# Patient Record
Sex: Female | Born: 1960 | Race: Black or African American | Hispanic: No | Marital: Married | Smoking: Never smoker
Health system: Southern US, Community
[De-identification: ages and names within clinical notes are randomized; demographics above are authoritative.]

## PROBLEM LIST (undated history)

## (undated) DIAGNOSIS — K219 Gastro-esophageal reflux disease without esophagitis: Secondary | ICD-10-CM

## (undated) DIAGNOSIS — G620 Drug-induced polyneuropathy: Secondary | ICD-10-CM

## (undated) DIAGNOSIS — K8020 Calculus of gallbladder without cholecystitis without obstruction: Secondary | ICD-10-CM

## (undated) DIAGNOSIS — C801 Malignant (primary) neoplasm, unspecified: Secondary | ICD-10-CM

## (undated) DIAGNOSIS — T451X5A Adverse effect of antineoplastic and immunosuppressive drugs, initial encounter: Secondary | ICD-10-CM

---

## 1993-08-12 HISTORY — PX: TUBAL LIGATION: SHX77

## 1997-11-07 ENCOUNTER — Other Ambulatory Visit: Admission: RE | Admit: 1997-11-07 | Discharge: 1997-11-07 | Payer: Self-pay | Admitting: Obstetrics & Gynecology

## 1999-01-31 ENCOUNTER — Other Ambulatory Visit: Admission: RE | Admit: 1999-01-31 | Discharge: 1999-01-31 | Payer: Self-pay | Admitting: Obstetrics & Gynecology

## 1999-11-06 ENCOUNTER — Encounter: Admission: RE | Admit: 1999-11-06 | Discharge: 1999-11-06 | Payer: Self-pay | Admitting: Family Medicine

## 2000-02-01 ENCOUNTER — Other Ambulatory Visit: Admission: RE | Admit: 2000-02-01 | Discharge: 2000-02-01 | Payer: Self-pay | Admitting: Obstetrics & Gynecology

## 2001-01-29 ENCOUNTER — Encounter: Admission: RE | Admit: 2001-01-29 | Discharge: 2001-01-29 | Payer: Self-pay | Admitting: Family Medicine

## 2001-01-29 ENCOUNTER — Encounter: Payer: Self-pay | Admitting: Family Medicine

## 2001-01-30 ENCOUNTER — Encounter: Admission: RE | Admit: 2001-01-30 | Discharge: 2001-01-30 | Payer: Self-pay | Admitting: Orthopedic Surgery

## 2001-10-06 ENCOUNTER — Encounter: Payer: Self-pay | Admitting: Family Medicine

## 2001-10-06 ENCOUNTER — Encounter: Admission: RE | Admit: 2001-10-06 | Discharge: 2001-10-06 | Payer: Self-pay | Admitting: Family Medicine

## 2002-09-20 ENCOUNTER — Other Ambulatory Visit: Admission: RE | Admit: 2002-09-20 | Discharge: 2002-09-20 | Payer: Self-pay | Admitting: Obstetrics & Gynecology

## 2003-05-09 ENCOUNTER — Encounter: Admission: RE | Admit: 2003-05-09 | Discharge: 2003-05-09 | Payer: Self-pay | Admitting: Obstetrics & Gynecology

## 2003-05-09 ENCOUNTER — Encounter: Payer: Self-pay | Admitting: Obstetrics & Gynecology

## 2004-01-05 ENCOUNTER — Other Ambulatory Visit: Admission: RE | Admit: 2004-01-05 | Discharge: 2004-01-05 | Payer: Self-pay | Admitting: Obstetrics & Gynecology

## 2004-05-14 ENCOUNTER — Encounter: Admission: RE | Admit: 2004-05-14 | Discharge: 2004-05-14 | Payer: Self-pay | Admitting: Obstetrics & Gynecology

## 2005-03-15 ENCOUNTER — Other Ambulatory Visit: Admission: RE | Admit: 2005-03-15 | Discharge: 2005-03-15 | Payer: Self-pay | Admitting: Obstetrics & Gynecology

## 2005-05-15 ENCOUNTER — Encounter: Admission: RE | Admit: 2005-05-15 | Discharge: 2005-05-15 | Payer: Self-pay | Admitting: Obstetrics & Gynecology

## 2006-05-16 ENCOUNTER — Encounter: Admission: RE | Admit: 2006-05-16 | Discharge: 2006-05-16 | Payer: Self-pay | Admitting: Obstetrics & Gynecology

## 2007-06-17 ENCOUNTER — Encounter: Admission: RE | Admit: 2007-06-17 | Discharge: 2007-06-17 | Payer: Self-pay | Admitting: Obstetrics & Gynecology

## 2008-06-20 ENCOUNTER — Encounter: Admission: RE | Admit: 2008-06-20 | Discharge: 2008-06-20 | Payer: Self-pay | Admitting: Obstetrics & Gynecology

## 2009-07-19 ENCOUNTER — Encounter: Admission: RE | Admit: 2009-07-19 | Discharge: 2009-07-19 | Payer: Self-pay | Admitting: Obstetrics & Gynecology

## 2010-07-23 ENCOUNTER — Encounter
Admission: RE | Admit: 2010-07-23 | Discharge: 2010-07-23 | Payer: Self-pay | Source: Home / Self Care | Attending: Obstetrics & Gynecology | Admitting: Obstetrics & Gynecology

## 2010-09-01 ENCOUNTER — Encounter: Payer: Self-pay | Admitting: Obstetrics & Gynecology

## 2010-09-02 ENCOUNTER — Encounter: Payer: Self-pay | Admitting: Obstetrics & Gynecology

## 2011-06-27 ENCOUNTER — Other Ambulatory Visit: Payer: Self-pay | Admitting: Obstetrics & Gynecology

## 2011-06-27 DIAGNOSIS — Z1231 Encounter for screening mammogram for malignant neoplasm of breast: Secondary | ICD-10-CM

## 2011-07-25 ENCOUNTER — Ambulatory Visit: Payer: Self-pay

## 2011-07-26 ENCOUNTER — Ambulatory Visit
Admission: RE | Admit: 2011-07-26 | Discharge: 2011-07-26 | Disposition: A | Discharge status: Home / Self Care | Payer: Self-pay | Source: Ambulatory Visit | Attending: Obstetrics & Gynecology | Admitting: Obstetrics & Gynecology

## 2011-07-26 DIAGNOSIS — Z1231 Encounter for screening mammogram for malignant neoplasm of breast: Secondary | ICD-10-CM

## 2012-02-08 ENCOUNTER — Emergency Department (HOSPITAL_COMMUNITY)
Admission: EM | Admit: 2012-02-08 | Discharge: 2012-02-09 | Disposition: A | Discharge status: Home / Self Care | Payer: PRIVATE HEALTH INSURANCE | Attending: Emergency Medicine | Admitting: Emergency Medicine

## 2012-02-08 ENCOUNTER — Encounter (HOSPITAL_COMMUNITY): Payer: Self-pay | Admitting: Emergency Medicine

## 2012-02-08 DIAGNOSIS — J020 Streptococcal pharyngitis: Secondary | ICD-10-CM | POA: Insufficient documentation

## 2012-02-08 LAB — RAPID STREP SCREEN (MED CTR MEBANE ONLY): Streptococcus, Group A Screen (Direct): POSITIVE — AB

## 2012-02-08 MED ORDER — IBUPROFEN 400 MG PO TABS
800.0000 mg | ORAL_TABLET | Freq: Once | ORAL | Status: AC
  Administered 2012-02-08: 800 mg via ORAL
  Filled 2012-02-08: qty 2

## 2012-02-08 MED ORDER — ACETAMINOPHEN 325 MG PO TABS
975.0000 mg | ORAL_TABLET | Freq: Once | ORAL | Status: AC
  Administered 2012-02-08: 975 mg via ORAL

## 2012-02-08 MED ORDER — ACETAMINOPHEN 325 MG PO TABS
ORAL_TABLET | ORAL | Status: AC
  Administered 2012-02-08: 975 mg via ORAL
  Filled 2012-02-08: qty 3

## 2012-02-08 MED ORDER — SODIUM CHLORIDE 0.9 % IV BOLUS (SEPSIS)
1000.0000 mL | Freq: Once | INTRAVENOUS | Status: AC
  Administered 2012-02-09: 1000 mL via INTRAVENOUS

## 2012-02-08 MED ORDER — DEXAMETHASONE SODIUM PHOSPHATE 10 MG/ML IJ SOLN
10.0000 mg | Freq: Once | INTRAMUSCULAR | Status: AC
  Administered 2012-02-09: 10 mg via INTRAVENOUS
  Filled 2012-02-08: qty 1

## 2012-02-08 MED ORDER — PENICILLIN G BENZATHINE 1200000 UNIT/2ML IM SUSP
1.2000 10*6.[IU] | Freq: Once | INTRAMUSCULAR | Status: AC
  Administered 2012-02-09: 1.2 10*6.[IU] via INTRAMUSCULAR
  Filled 2012-02-08: qty 2

## 2012-02-08 NOTE — ED Provider Notes (Signed)
History     CSN: [id number]  Arrival date & time 02/08/12  2058   First MD Initiated Contact with Patient 02/08/12 2109      Chief Complaint  Patient presents with  . Headache  . Fever  . Sore Throat    (Consider location/radiation/quality/duration/timing/severity/associated sxs/prior treatment) The history is provided by the patient.   healthy 51 year old female presents to the emergency department with a chief complaint of sore throat since yesterday. Constant pain, severe. Associated with fever, headache, bilateral ear pain, myalgias. Denies rhinorrhea, neck stiffness, cough, chest pain, N/V, rash. Has been using Tylenol and ibuprofen with some relief. No prior treatment.   History reviewed. No pertinent past medical history.  History reviewed. No pertinent past surgical history.  History reviewed. No pertinent family history.  History  Substance Use Topics  . Smoking status: Never Smoker   . Smokeless tobacco: Not on file  . Alcohol Use: No     Review of Systems  All pertinent positives and negatives are reviewed in the history of present illness. Allergies  Review of patient's allergies indicates no known allergies.  Home Medications  No current outpatient prescriptions on file.  BP 144/85  Pulse 125  Temp 102.8 F (39.3 C) (Oral)  Resp 18  SpO2 95%  Physical Exam  Nursing note reviewed. Constitutional: She appears well-developed and well-nourished.       Vital signs are reviewed and are significant for fever and tachycardia. Uncomfortable appearing.  HENT:  Head: Normocephalic and atraumatic. No trismus in the jaw.  Right Ear: Tympanic membrane, external ear and ear canal normal.  Left Ear: Tympanic membrane, external ear and ear canal normal.  Nose: Nose normal. Right sinus exhibits no maxillary sinus tenderness and no frontal sinus tenderness. Left sinus exhibits no maxillary sinus tenderness and no frontal sinus tenderness.  Mouth/Throat: Uvula is  midline and mucous membranes are normal. No uvula swelling. Oropharyngeal exudate, posterior oropharyngeal edema and posterior oropharyngeal erythema present. No tonsillar abscesses.  Neck: Normal range of motion. Neck supple.  Cardiovascular:       Tachycardia with regular rhythm.  Pulmonary/Chest: Effort normal. No respiratory distress.  Lymphadenopathy:    She has cervical adenopathy.  Neurological: She is alert.  Skin: Skin is warm and dry. No rash noted.  Psychiatric: She has a normal mood and affect.    ED Course  Procedures (including critical care time)  Labs Reviewed - No data to display No results found.   Dx 1: Strep pharyngitis   MDM  Strep pharyngitis. No trismus with bilaterally symmetric tonsillar exudates- not c/w peritonsillar abscess. Maintaining airway and tolerating PO well in ED. Return precautions discussed. Pt prefers single abx injection rather than PO abx course. Tachycardia and fever improved after ibuprofen, IV fluids.        Shaaron Adler, PA-C 02/08/12 2350

## 2012-02-08 NOTE — Discharge Instructions (Signed)

## 2012-02-08 NOTE — ED Notes (Signed)
Pt reports has sore throat headache and fever with generalized malaise onset x2 days

## 2012-02-09 NOTE — ED Provider Notes (Signed)
Medical screening examination/treatment/procedure(s) were performed by non-physician practitioner and as supervising physician I was immediately available for consultation/collaboration.   Forbes Cellar, MD 02/09/12 0021

## 2012-03-27 ENCOUNTER — Other Ambulatory Visit: Payer: Self-pay | Admitting: Obstetrics & Gynecology

## 2012-03-27 DIAGNOSIS — Z1231 Encounter for screening mammogram for malignant neoplasm of breast: Secondary | ICD-10-CM

## 2012-05-04 ENCOUNTER — Ambulatory Visit (INDEPENDENT_AMBULATORY_CARE_PROVIDER_SITE_OTHER): Payer: PRIVATE HEALTH INSURANCE | Admitting: Sports Medicine

## 2012-05-04 VITALS — BP 120/74 | Ht 62.0 in | Wt 180.0 lb

## 2012-05-04 DIAGNOSIS — M25569 Pain in unspecified knee: Secondary | ICD-10-CM | POA: Insufficient documentation

## 2012-05-04 DIAGNOSIS — M7120 Synovial cyst of popliteal space [Baker], unspecified knee: Secondary | ICD-10-CM

## 2012-05-04 NOTE — Progress Notes (Signed)
  Subjective:    Patient ID: Natasha Huffman, female    DOB: January 20, 1961, 51 y.o.   MRN: [id number]  HPI chief complaint: Left knee pain  Very pleasant 51 year old female comes in today complaining of 1 month of left knee pain. Pain began acutely and she is going from a seated to standing position. She felt a pulling sensation in the posterior aspect of her knee followed shortly thereafter by pain. Pain is mainly along the medial and posterior aspect of the knee. She has noticed some swelling. She was initially placed on Voltaren but was most recently placed on indomethacin which has been very helpful. She's been on indomethacin for the past 5 days and her pain has improved. No mechanical symptoms. No associated numbness or tingling. No problems with his knee in the past. No prior knee surgeries.  Medical history is positive for reflux disease Medications include Nexium, melatonin, and Estroven She's allergic to amoxicillin Socially she does not smoke, does not drink and is married.    Review of Systems as above.     Objective:   Physical Exam Well-developed, well-nourished. No acute distress. Awake alert and oriented x3. Normal affect. Vital signs are reviewed.  Left knee: Range of motion 0-120. Trace effusion. She is swelling in the popliteal fossa consistent with a Baker's cyst. She is tender to palpation along the medial joint line. Negative McMurray. Negative Thessalys.. Knee is stable to valgus and varus stressing. Minimal patellofemoral crepitus. She is neurovascular intact distally. Walking without significant limp.       Assessment & Plan:  1. 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 She will continue on this for an additional 7 days. She is cautioned about stomach upset and will discontinue the medicine immediately if this occurs. I've given her a body helix  compression sleeve for her left knee. Followup with me in 3 weeks. If symptoms persist or worsen in the interim, I would consider plain x-rays of the left knee prior to contemplating a cortisone injection. If symptoms resolve patient may cancel her followup appointment to followup when necessary.

## 2012-05-25 ENCOUNTER — Ambulatory Visit: Payer: PRIVATE HEALTH INSURANCE | Admitting: Sports Medicine

## 2012-07-27 ENCOUNTER — Ambulatory Visit: Payer: PRIVATE HEALTH INSURANCE

## 2012-08-27 ENCOUNTER — Ambulatory Visit
Admission: RE | Admit: 2012-08-27 | Discharge: 2012-08-27 | Disposition: A | Discharge status: Home / Self Care | Payer: PRIVATE HEALTH INSURANCE | Source: Ambulatory Visit | Attending: Obstetrics & Gynecology | Admitting: Obstetrics & Gynecology

## 2012-08-27 DIAGNOSIS — Z1231 Encounter for screening mammogram for malignant neoplasm of breast: Secondary | ICD-10-CM

## 2012-10-13 ENCOUNTER — Ambulatory Visit
Admission: RE | Admit: 2012-10-13 | Discharge: 2012-10-13 | Disposition: A | Discharge status: Home / Self Care | Payer: PRIVATE HEALTH INSURANCE | Source: Ambulatory Visit | Attending: Physician Assistant | Admitting: Physician Assistant

## 2012-10-13 ENCOUNTER — Other Ambulatory Visit: Payer: Self-pay | Admitting: Physician Assistant

## 2012-10-13 DIAGNOSIS — R52 Pain, unspecified: Secondary | ICD-10-CM

## 2012-10-21 ENCOUNTER — Ambulatory Visit
Admission: RE | Admit: 2012-10-21 | Discharge: 2012-10-21 | Disposition: A | Discharge status: Home / Self Care | Payer: PRIVATE HEALTH INSURANCE | Source: Ambulatory Visit | Attending: Internal Medicine | Admitting: Internal Medicine

## 2012-10-21 ENCOUNTER — Other Ambulatory Visit: Payer: Self-pay | Admitting: Internal Medicine

## 2013-08-24 ENCOUNTER — Other Ambulatory Visit: Payer: Self-pay

## 2013-08-24 DIAGNOSIS — Z1231 Encounter for screening mammogram for malignant neoplasm of breast: Secondary | ICD-10-CM

## 2013-09-06 ENCOUNTER — Encounter (HOSPITAL_COMMUNITY): Payer: Self-pay | Admitting: Emergency Medicine

## 2013-09-06 ENCOUNTER — Emergency Department (HOSPITAL_COMMUNITY): Payer: PRIVATE HEALTH INSURANCE

## 2013-09-06 DIAGNOSIS — R0789 Other chest pain: Secondary | ICD-10-CM | POA: Insufficient documentation

## 2013-09-06 LAB — BASIC METABOLIC PANEL
BUN: 16 mg/dL (ref 6–23)
CALCIUM: 8.9 mg/dL (ref 8.4–10.5)
CHLORIDE: 103 meq/L (ref 96–112)
CO2: 25 meq/L (ref 19–32)
CREATININE: 0.86 mg/dL (ref 0.50–1.10)
GFR calc non Af Amer: 77 mL/min — ABNORMAL LOW (ref >90)
GFR, EST AFRICAN AMERICAN: 90 mL/min — AB (ref >90)
Glucose, Bld: 104 mg/dL — ABNORMAL HIGH (ref 70–99)
Potassium: 4.2 mEq/L (ref 3.7–5.3)
SODIUM: 140 meq/L (ref 137–147)

## 2013-09-06 LAB — CBC
HCT: 37.9 % (ref 36.0–46.0)
Hemoglobin: 12.5 g/dL (ref 12.0–15.0)
MCH: 27.8 pg (ref 26.0–34.0)
MCHC: 33 g/dL (ref 30.0–36.0)
MCV: 84.2 fL (ref 78.0–100.0)
PLATELETS: 288 10*3/uL (ref 150–400)
RBC: 4.5 MIL/uL (ref 3.87–5.11)
RDW: 13.3 % (ref 11.5–15.5)
WBC: 7.2 10*3/uL (ref 4.0–10.5)

## 2013-09-06 LAB — POCT I-STAT TROPONIN I: Troponin i, poc: 0 ng/mL (ref 0.00–0.08)

## 2013-09-06 NOTE — ED Notes (Signed)
Patient with chest pain under left breast that started yesterday.  Patient states that it has moved to the center of her chest also.  Patient denies any shortness of breath or nausea/vomiting.  Patient is CAOx3 at this time.

## 2013-09-07 ENCOUNTER — Emergency Department (HOSPITAL_COMMUNITY)
Admission: EM | Admit: 2013-09-07 | Discharge: 2013-09-07 | Disposition: A | Discharge status: Home / Self Care | Payer: PRIVATE HEALTH INSURANCE | Attending: Emergency Medicine | Admitting: Emergency Medicine

## 2013-09-07 DIAGNOSIS — R0789 Other chest pain: Secondary | ICD-10-CM

## 2013-09-07 NOTE — ED Provider Notes (Signed)
CSN: [id number]     Arrival date & time 09/06/13  2101 History   First MD Initiated Contact with Patient 09/07/13 0203     Chief Complaint  Patient presents with  . Chest Pain   HPI  History provided by the patient. Patient is a 53 year old African American female with no significant PMH who presents with intermittent episodes of left chest wall pain. Patient reports having pain around her left chest and breast area for the past 2 days. Pain is intermittent and may last 1-2 minutes. It is described as an aching pain. It sometimes radiates into the sternal area. Patient states pain seems to come most often when she is feeling stressed about school, work or family. It may happen at rest or when she is up and active. It is not made worse by any eating or drinking. It is not positional. It is not pleuritic. There is no associated shortness of breath, cough, hemoptysis, nausea diaphoresis. Patient has not used any treatment for symptoms. Denies any other aggravating or alleviating factors. No other associated symptoms. She has no recent travel, estrogen or birth control use, previous DVT or PE, active cancer, recent surgery.    History reviewed. No pertinent past medical history. History reviewed. No pertinent past surgical history. History reviewed. No pertinent family history. History  Substance Use Topics  . Smoking status: Never Smoker   . Smokeless tobacco: Not on file  . Alcohol Use: No   OB History   Grav Para Term Preterm Abortions TAB SAB Ect Mult Living                 Review of Systems  Constitutional: Negative for diaphoresis.  Respiratory: Negative for cough and shortness of breath.   Cardiovascular: Positive for chest pain. Negative for palpitations.  Gastrointestinal: Negative for nausea.  All other systems reviewed and are negative.    Allergies  Review of patient's allergies indicates no known allergies.  Home Medications   Current Outpatient Rx  Name  Route  Sig   Dispense  Refill  . ibuprofen (ADVIL,MOTRIN) 200 MG tablet   Oral   Take 800 mg by mouth 2 (two) times daily as needed.          BP 129/84  Pulse 70  Temp(Src) 97.1 F (36.2 C) (Oral)  Resp 20  SpO2 97% Physical Exam  Nursing note and vitals reviewed. Constitutional: She is oriented to person, place, and time. She appears well-developed and well-nourished. No distress.  HENT:  Head: Normocephalic.  Cardiovascular: Normal rate and regular rhythm.   No murmur heard. Pulmonary/Chest: Effort normal and breath sounds normal. No respiratory distress. She has no wheezes. She has no rales.  Abdominal: Soft. There is no tenderness. There is no rebound.  Musculoskeletal: Normal range of motion. She exhibits no edema and no tenderness.  No clinical signs concerning for DVT.  Neurological: She is alert and oriented to person, place, and time.  Skin: Skin is warm and dry. No rash noted.  Psychiatric: She has a normal mood and affect. Her behavior is normal.    ED Course  Procedures   DIAGNOSTIC STUDIES: Oxygen Saturation is 97% on room air.  COORDINATION OF CARE:  Nursing notes reviewed. Vital signs reviewed. Initial pt interview and examination performed.   2:18 AM-patient seen and evaluated. She is resting appears comfortable no acute distress. Denies any pain at this time. Her symptoms are atypical brief coming at rest or activity. She has no significant risk factors  for ACS. No history of hypertension, high cholesterol, diabetes. She has no family history of heart attacks or heart disease. She does not smoke or use alcohol.   Patient also low risk for PE. She has normal respirations no complaints of shortness of breath. Normal heart rate. No previous history of DVT or PE. No recent travel, surgery or immobility. No pain or swelling extremities. No estrogen or birth control use. No active history of cancer. No hemoptysis.  Patient's lab testing, troponin, EKG and chest x-ray have  not shown any concerning acute findings. At this time patient may be discharged home. She will followup with her PCP, Dr. [name] later today to discuss further evaluation. Strict return precautions given.  Results for orders placed during the hospital encounter of 09/07/13  CBC      Result Value Range   WBC 7.2  4.0 - 10.5 K/uL   RBC 4.50  3.87 - 5.11 MIL/uL   Hemoglobin 12.5  12.0 - 15.0 g/dL   HCT 37.9  36.0 - 46.0 %   MCV 84.2  78.0 - 100.0 fL   MCH 27.8  26.0 - 34.0 pg   MCHC 33.0  30.0 - 36.0 g/dL   RDW 13.3  11.5 - 15.5 %   Platelets 288  150 - 400 K/uL  BASIC METABOLIC PANEL      Result Value Range   Sodium 140  137 - 147 mEq/L   Potassium 4.2  3.7 - 5.3 mEq/L   Chloride 103  96 - 112 mEq/L   CO2 25  19 - 32 mEq/L   Glucose, Bld 104 (*) 70 - 99 mg/dL   BUN 16  6 - 23 mg/dL   Creatinine, Ser 0.86  0.50 - 1.10 mg/dL   Calcium 8.9  8.4 - 10.5 mg/dL   GFR calc non Af Amer 77 (*) >90 mL/min   GFR calc Af Amer 90 (*) >90 mL/min  POCT I-STAT TROPONIN I      Result Value Range   Troponin i, poc 0.00  0.00 - 0.08 ng/mL   Comment 3                Imaging Review Dg Chest 2 View  09/06/2013   CLINICAL DATA:  Mid and left-sided chest pain.  EXAM: CHEST  2 VIEW  COMPARISON:  None.  FINDINGS: The heart size and mediastinal contours are within normal limits. Both lungs are predominantly clear. No pleural effusion or pneumothorax. The visualized skeletal structures are unremarkable.  IMPRESSION: No radiographic evidence of an acute cardiopulmonary process.   Electronically Signed   By: Carlos Levering M.D.   On: 09/06/2013 21:32    EKG Interpretation   None      Date: 09/07/2013  Rate: 90  Rhythm: normal sinus rhythm  QRS Axis: normal  Intervals: normal  ST/T Wave abnormalities: nonspecific T wave changes  Conduction Disutrbances:none  Narrative Interpretation: Possible slight left atrial enlargement. Possible septal infarct age undetermined  Old EKG Reviewed: none  available    MDM   1. Atypical chest pain       Martie Lee, PA-C 09/07/13 0231

## 2013-09-07 NOTE — Discharge Instructions (Signed)
Your lab testing, EKG of your heart and chest x-ray had not shown any signs for concerning or emerging causes of your symptoms at this time. Your providers feel you may return home and followup with your primary care provider later today for continued evaluation and treatment. Please discuss with your doctor the need for further evaluation of your heart with possible stress test.    Chest Pain (Nonspecific) It is often hard to give a specific diagnosis for the cause of chest pain. There is always a chance that your pain could be related to something serious, such as a heart attack or a blood clot in the lungs. You need to follow up with your caregiver for further evaluation. CAUSES   Heartburn.  Pneumonia or bronchitis.  Anxiety or stress.  Inflammation around your heart (pericarditis) or lung (pleuritis or pleurisy).  A blood clot in the lung.  A collapsed lung (pneumothorax). It can develop suddenly on its own (spontaneous pneumothorax) or from injury (trauma) to the chest.  Shingles infection (herpes zoster virus). The chest wall is composed of bones, muscles, and cartilage. Any of these can be the source of the pain.  The bones can be bruised by injury.  The muscles or cartilage can be strained by coughing or overwork.  The cartilage can be affected by inflammation and become sore (costochondritis). DIAGNOSIS  Lab tests or other studies, such as X-rays, electrocardiography, stress testing, or cardiac imaging, may be needed to find the cause of your pain.  TREATMENT   Treatment depends on what may be causing your chest pain. Treatment may include:  Acid blockers for heartburn.  Anti-inflammatory medicine.  Pain medicine for inflammatory conditions.  Antibiotics if an infection is present.  You may be advised to change lifestyle habits. This includes stopping smoking and avoiding alcohol, caffeine, and chocolate.  You may be advised to keep your head raised (elevated)  when sleeping. This reduces the chance of acid going backward from your stomach into your esophagus.  Most of the time, nonspecific chest pain will improve within 2 to 3 days with rest and mild pain medicine. HOME CARE INSTRUCTIONS   If antibiotics were prescribed, take your antibiotics as directed. Finish them even if you start to feel better.  For the next few days, avoid physical activities that bring on chest pain. Continue physical activities as directed.  Do not smoke.  Avoid drinking alcohol.  Only take over-the-counter or prescription medicine for pain, discomfort, or fever as directed by your caregiver.  Follow your caregiver's suggestions for further testing if your chest pain does not go away.  Keep any follow-up appointments you made. If you do not go to an appointment, you could develop lasting (chronic) problems with pain. If there is any problem keeping an appointment, you must call to reschedule. SEEK MEDICAL CARE IF:   You think you are having problems from the medicine you are taking. Read your medicine instructions carefully.  Your chest pain does not go away, even after treatment.  You develop a rash with blisters on your chest. SEEK IMMEDIATE MEDICAL CARE IF:   You have increased chest pain or pain that spreads to your arm, neck, jaw, back, or abdomen.  You develop shortness of breath, an increasing cough, or you are coughing up blood.  You have severe back or abdominal pain, feel nauseous, or vomit.  You develop severe weakness, fainting, or chills.  You have a fever. THIS IS AN EMERGENCY. Do not wait to see if  the pain will go away. Get medical help at once. Call your local emergency services (911 in [place] Do not drive yourself to the hospital. MAKE SURE YOU:   Understand these instructions.  Will watch your condition.  Will get help right away if you are not doing well or get worse. Document Released: 05/08/2005 Document Revised: 10/21/2011  Document Reviewed: 03/03/2008 [facility] Patient Information 2014 [place].

## 2013-09-07 NOTE — ED Provider Notes (Signed)
Medical screening examination/treatment/procedure(s) were performed by non-physician practitioner and as supervising physician I was immediately available for consultation/collaboration.  EKG Interpretation   None         Elyn Peers, MD 09/07/13 0330

## 2013-09-13 ENCOUNTER — Ambulatory Visit
Admission: RE | Admit: 2013-09-13 | Discharge: 2013-09-13 | Disposition: A | Discharge status: Home / Self Care | Payer: PRIVATE HEALTH INSURANCE | Source: Ambulatory Visit

## 2013-09-13 DIAGNOSIS — Z1231 Encounter for screening mammogram for malignant neoplasm of breast: Secondary | ICD-10-CM

## 2014-07-22 ENCOUNTER — Other Ambulatory Visit: Payer: Self-pay

## 2014-07-22 DIAGNOSIS — Z1231 Encounter for screening mammogram for malignant neoplasm of breast: Secondary | ICD-10-CM

## 2014-08-07 ENCOUNTER — Emergency Department (HOSPITAL_COMMUNITY)
Admission: EM | Admit: 2014-08-07 | Discharge: 2014-08-07 | Disposition: A | Discharge status: Home / Self Care | Payer: PRIVATE HEALTH INSURANCE | Attending: Emergency Medicine | Admitting: Emergency Medicine

## 2014-08-07 ENCOUNTER — Emergency Department (HOSPITAL_COMMUNITY): Payer: PRIVATE HEALTH INSURANCE

## 2014-08-07 ENCOUNTER — Other Ambulatory Visit: Payer: Self-pay

## 2014-08-07 ENCOUNTER — Encounter (HOSPITAL_COMMUNITY): Payer: Self-pay | Admitting: Nurse Practitioner

## 2014-08-07 DIAGNOSIS — Z791 Long term (current) use of non-steroidal anti-inflammatories (NSAID): Secondary | ICD-10-CM | POA: Insufficient documentation

## 2014-08-07 DIAGNOSIS — R0789 Other chest pain: Secondary | ICD-10-CM | POA: Insufficient documentation

## 2014-08-07 DIAGNOSIS — R079 Chest pain, unspecified: Secondary | ICD-10-CM

## 2014-08-07 LAB — BASIC METABOLIC PANEL
ANION GAP: 8 (ref 5–15)
BUN: 9 mg/dL (ref 6–23)
CHLORIDE: 104 meq/L (ref 96–112)
CO2: 25 mmol/L (ref 19–32)
CREATININE: 0.87 mg/dL (ref 0.50–1.10)
Calcium: 9.1 mg/dL (ref 8.4–10.5)
GFR calc non Af Amer: 76 mL/min — ABNORMAL LOW (ref >90)
GFR, EST AFRICAN AMERICAN: 88 mL/min — AB (ref >90)
Glucose, Bld: 118 mg/dL — ABNORMAL HIGH (ref 70–99)
POTASSIUM: 3.6 mmol/L (ref 3.5–5.1)
Sodium: 137 mmol/L (ref 135–145)

## 2014-08-07 LAB — CBC
HCT: 38.7 % (ref 36.0–46.0)
HEMOGLOBIN: 12.3 g/dL (ref 12.0–15.0)
MCH: 26.6 pg (ref 26.0–34.0)
MCHC: 31.8 g/dL (ref 30.0–36.0)
MCV: 83.6 fL (ref 78.0–100.0)
Platelets: 264 10*3/uL (ref 150–400)
RBC: 4.63 MIL/uL (ref 3.87–5.11)
RDW: 13.4 % (ref 11.5–15.5)
WBC: 4.9 10*3/uL (ref 4.0–10.5)

## 2014-08-07 LAB — I-STAT TROPONIN, ED: Troponin i, poc: 0 ng/mL (ref 0.00–0.08)

## 2014-08-07 NOTE — Discharge Instructions (Signed)

## 2014-08-07 NOTE — ED Provider Notes (Signed)
CSN: [id number]     Arrival date & time 08/07/14  1743 History   First MD Initiated Contact with Patient 08/07/14 1950     Chief Complaint  Patient presents with  . Chest Pain     (Consider location/radiation/quality/duration/timing/severity/associated sxs/prior Treatment) Patient is a 53 y.o. female presenting with chest pain. The history is provided by the patient.  Chest Pain Pain location:  L chest Associated symptoms: no abdominal pain, no back pain, no headache, no nausea, no numbness, no shortness of breath, not vomiting and no weakness    patient has been having episodes of sharp left-sided chest pain. It is near the sternum and a little more lateral also. It comes and goes and lasts a few seconds. Does not come on with exertion. She states comes on if she is thinking about it. She states she's been having a lot of stress recently and has a big project at work. No shortness breath. No cough. She does not smoke. She had similar episode back in January with a negative workup at that time. No swelling or legs. She is not on birth control pills. No family history of coronary artery disease at an early age.  History reviewed. No pertinent past medical history. Past Surgical History  Procedure Laterality Date  . Cesarean section    . Tubal ligation     Family History  Problem Relation Age of Onset  . Hypertension Sister    History  Substance Use Topics  . Smoking status: Never Smoker   . Smokeless tobacco: Not on file  . Alcohol Use: No   OB History    No data available     Review of Systems  Constitutional: Negative for activity change and appetite change.  Eyes: Negative for pain.  Respiratory: Negative for chest tightness and shortness of breath.   Cardiovascular: Positive for chest pain. Negative for leg swelling.  Gastrointestinal: Negative for nausea, vomiting, abdominal pain and diarrhea.  Genitourinary: Negative for flank pain.  Musculoskeletal: Negative for back  pain and neck stiffness.  Skin: Negative for rash.  Neurological: Negative for weakness, numbness and headaches.  Psychiatric/Behavioral: Negative for behavioral problems.      Allergies  Review of patient's allergies indicates no known allergies.  Home Medications   Prior to Admission medications   Medication Sig Start Date End Date Taking? Authorizing Provider  ibuprofen (ADVIL,MOTRIN) 200 MG tablet Take 800 mg by mouth 2 (two) times daily as needed.   Yes Historical Provider, MD  zolpidem (AMBIEN) 10 MG tablet Take 10 mg by mouth at bedtime as needed for sleep.   Yes Historical Provider, MD   BP 113/72 mmHg  Pulse 79  Temp(Src) 98.3 F (36.8 C) (Oral)  Resp 17  SpO2 100% Physical Exam  Constitutional: She is oriented to person, place, and time. She appears well-developed and well-nourished.  HENT:  Head: Normocephalic and atraumatic.  Eyes: Pupils are equal, round, and reactive to light.  Neck: No JVD present.  Cardiovascular: Normal rate, regular rhythm and normal heart sounds.   No murmur heard. Pulmonary/Chest: Effort normal and breath sounds normal. No respiratory distress. She has no wheezes. She has no rales.  Abdominal: Soft. Bowel sounds are normal. She exhibits no distension. There is no tenderness. There is no rebound and no guarding.  Musculoskeletal: Normal range of motion.  Neurological: She is alert and oriented to person, place, and time. No cranial nerve deficit.  Skin: Skin is warm and dry.  Psychiatric: She has a  normal mood and affect. Her speech is normal.  Nursing note and vitals reviewed.   ED Course  Procedures (including critical care time) Labs Review Labs Reviewed  BASIC METABOLIC PANEL - Abnormal; Notable for the following:    Glucose, Bld 118 (*)    GFR calc non Af Amer 76 (*)    GFR calc Af Amer 88 (*)    All other components within normal limits  CBC  I-STAT TROPOININ, ED    Imaging Review Dg Chest 2 View  08/07/2014   CLINICAL  DATA:  Chest pain for 2 days  EXAM: CHEST  2 VIEW  COMPARISON:  09/06/2013  FINDINGS: The heart size and mediastinal contours are within normal limits. Both lungs are clear. The visualized skeletal structures are unremarkable.  IMPRESSION: No active cardiopulmonary disease.   Electronically Signed   By: Inez Catalina M.D.   On: 08/07/2014 19:02     EKG Interpretation   Date/Time:  Sunday August 07 2014 17:50:42 EST Ventricular Rate:  89 PR Interval:  182 QRS Duration: 78 QT Interval:  376 QTC Calculation: 457 R Axis:   69 Text Interpretation:  Sinus rhythm with occasional Premature ventricular  complexes Septal infarct , age undetermined Abnormal ECG No significant  change since last tracing Confirmed by Melessia Kaus  MD, Ovid Curd [phone]) on  08/07/2014 7:53:14 PM      MDM   Final diagnoses:  Chest pain, unspecified chest pain type    Patient with low risk chest pain. Enzymes negative. EKG stable. Will discharge home to follow-up as needed. Very low risk for pulmonary embolism.    Jasper Riling. Alvino Chapel, MD 08/07/14 2010

## 2014-08-07 NOTE — ED Notes (Signed)
Md at the bedside

## 2014-08-07 NOTE — ED Notes (Signed)
Pt c/o intermittent L sided chest pain x 3 days. Nothing increases or decreases the pain. She denies n/v/sob. she is A&Ox4, resp e/u

## 2014-09-14 ENCOUNTER — Ambulatory Visit
Admission: RE | Admit: 2014-09-14 | Discharge: 2014-09-14 | Disposition: A | Discharge status: Home / Self Care | Payer: PRIVATE HEALTH INSURANCE | Source: Ambulatory Visit | Attending: Obstetrics & Gynecology | Admitting: Obstetrics & Gynecology

## 2014-09-14 ENCOUNTER — Other Ambulatory Visit: Payer: Self-pay

## 2014-09-14 ENCOUNTER — Ambulatory Visit
Admission: RE | Admit: 2014-09-14 | Discharge: 2014-09-14 | Disposition: A | Discharge status: Home / Self Care | Payer: PRIVATE HEALTH INSURANCE | Source: Ambulatory Visit

## 2014-09-14 ENCOUNTER — Other Ambulatory Visit: Payer: Self-pay | Admitting: Obstetrics & Gynecology

## 2014-09-14 DIAGNOSIS — N6320 Unspecified lump in the left breast, unspecified quadrant: Secondary | ICD-10-CM

## 2014-09-14 DIAGNOSIS — Z1231 Encounter for screening mammogram for malignant neoplasm of breast: Secondary | ICD-10-CM

## 2015-02-03 ENCOUNTER — Ambulatory Visit (HOSPITAL_COMMUNITY): Payer: PRIVATE HEALTH INSURANCE

## 2015-02-03 ENCOUNTER — Other Ambulatory Visit (HOSPITAL_COMMUNITY): Payer: Self-pay | Admitting: Family Medicine

## 2015-02-03 DIAGNOSIS — R1010 Upper abdominal pain, unspecified: Secondary | ICD-10-CM

## 2015-02-03 DIAGNOSIS — R748 Abnormal levels of other serum enzymes: Secondary | ICD-10-CM

## 2015-02-06 ENCOUNTER — Ambulatory Visit (HOSPITAL_COMMUNITY)
Admission: RE | Admit: 2015-02-06 | Discharge: 2015-02-06 | Disposition: A | Discharge status: Home / Self Care | Payer: 59 | Source: Ambulatory Visit | Attending: Family Medicine | Admitting: Family Medicine

## 2015-02-06 DIAGNOSIS — R748 Abnormal levels of other serum enzymes: Secondary | ICD-10-CM

## 2015-02-06 DIAGNOSIS — K8020 Calculus of gallbladder without cholecystitis without obstruction: Secondary | ICD-10-CM | POA: Diagnosis not present

## 2015-02-06 DIAGNOSIS — R1010 Upper abdominal pain, unspecified: Secondary | ICD-10-CM | POA: Diagnosis present

## 2015-02-20 ENCOUNTER — Emergency Department (HOSPITAL_COMMUNITY)
Admission: EM | Admit: 2015-02-20 | Discharge: 2015-02-21 | Disposition: A | Discharge status: Home / Self Care | Payer: 59 | Attending: Emergency Medicine | Admitting: Emergency Medicine

## 2015-02-20 ENCOUNTER — Encounter (HOSPITAL_COMMUNITY): Payer: Self-pay | Admitting: Emergency Medicine

## 2015-02-20 ENCOUNTER — Emergency Department (HOSPITAL_COMMUNITY): Payer: 59

## 2015-02-20 DIAGNOSIS — R079 Chest pain, unspecified: Secondary | ICD-10-CM | POA: Insufficient documentation

## 2015-02-20 DIAGNOSIS — Z8719 Personal history of other diseases of the digestive system: Secondary | ICD-10-CM | POA: Diagnosis not present

## 2015-02-20 HISTORY — DX: Calculus of gallbladder without cholecystitis without obstruction: K80.20

## 2015-02-20 LAB — CBC
HCT: 36.7 % (ref 36.0–46.0)
HEMOGLOBIN: 11.8 g/dL — AB (ref 12.0–15.0)
MCH: 26.8 pg (ref 26.0–34.0)
MCHC: 32.2 g/dL (ref 30.0–36.0)
MCV: 83.4 fL (ref 78.0–100.0)
Platelets: 275 10*3/uL (ref 150–400)
RBC: 4.4 MIL/uL (ref 3.87–5.11)
RDW: 13.2 % (ref 11.5–15.5)
WBC: 5.1 10*3/uL (ref 4.0–10.5)

## 2015-02-20 LAB — BASIC METABOLIC PANEL
ANION GAP: 7 (ref 5–15)
BUN: 8 mg/dL (ref 6–20)
CALCIUM: 9 mg/dL (ref 8.9–10.3)
CHLORIDE: 105 mmol/L (ref 101–111)
CO2: 26 mmol/L (ref 22–32)
Creatinine, Ser: 0.72 mg/dL (ref 0.44–1.00)
GFR calc non Af Amer: >60 mL/min (ref >60)
GLUCOSE: 91 mg/dL (ref 65–99)
POTASSIUM: 3.7 mmol/L (ref 3.5–5.1)
Sodium: 138 mmol/L (ref 135–145)

## 2015-02-20 LAB — I-STAT TROPONIN, ED
TROPONIN I, POC: 0 ng/mL (ref 0.00–0.08)
Troponin i, poc: 0 ng/mL (ref 0.00–0.08)

## 2015-02-20 NOTE — ED Provider Notes (Signed)
CSN: [id number]     Arrival date & time 02/20/15  2014 History   First MD Initiated Contact with Patient 02/20/15 2144     Chief Complaint  Patient presents with  . Chest Pain     (Consider location/radiation/quality/duration/timing/severity/associated sxs/prior Treatment) HPI Comments: Patient presents today with a chief complaint of chest pain.  She reports that the pain is located left anterior chest just left of the sternum.  Patient has been occurring intermittently since 5 PM today.  She reports that the pain typically lasts a few seconds and then resolves.  She describes the pain as sharp.  The pain has been occurring approximately every 20 minutes.  She denies any association with exertion or eating.  She states that she took some TUMS and also Nexium around 7 PM and feels that it may have helped.  She denies any SOB, nausea, vomiting, diaphoresis, cough, fever, chills, LE edema/pain, abdominal pain, numbness, or tingling.  She denies any prior cardiac history.  She denies any history of HTN, DM, or Hyperlipidemia.  No family history of CAD.  Denies any history of PE or DVT.  No prolonged travel or surgeries in the past 4 weeks.  Denies any exogenous estrogen use.  She reports that she has had similar pain in the past and has been seen in the ED.  She also reports that she has been under increased stress recently.  The history is provided by the patient.    Past Medical History  Diagnosis Date  . Gallstones    Past Surgical History  Procedure Laterality Date  . Cesarean section    . Tubal ligation     Family History  Problem Relation Age of Onset  . Hypertension Sister    History  Substance Use Topics  . Smoking status: Never Smoker   . Smokeless tobacco: Not on file  . Alcohol Use: No   OB History    No data available     Review of Systems  All other systems reviewed and are negative.     Allergies  Review of patient's allergies indicates no known  allergies.  Home Medications   Prior to Admission medications   Medication Sig Start Date End Date Taking? Authorizing Provider  ibuprofen (ADVIL,MOTRIN) 200 MG tablet Take 800 mg by mouth 2 (two) times daily as needed.    Historical Provider, MD  zolpidem (AMBIEN) 10 MG tablet Take 10 mg by mouth at bedtime as needed for sleep.    Historical Provider, MD   BP 158/85 mmHg  Pulse 74  Temp(Src) 98.3 F (36.8 C)  Resp 16  Ht 5\' 2"  (1.575 m)  Wt 174 lb (78.926 kg)  BMI 31.82 kg/m2  SpO2 99% Physical Exam  Constitutional: She appears well-developed and well-nourished.  HENT:  Head: Normocephalic and atraumatic.  Mouth/Throat: Oropharynx is clear and moist.  Neck: Normal range of motion. Neck supple.  Cardiovascular: Normal rate, regular rhythm and normal heart sounds.   Pulses:      Dorsalis pedis pulses are 2+ on the right side, and 2+ on the left side.  Pulmonary/Chest: Effort normal and breath sounds normal. No respiratory distress. She has no wheezes. She has no rales. She exhibits no tenderness.  Musculoskeletal: Normal range of motion.  No LE edema or erythema bilaterally  Neurological: She is alert.  Skin: Skin is warm and dry.  Psychiatric: She has a normal mood and affect.  Nursing note and vitals reviewed.   ED Course  Procedures (  including critical care time) Labs Review Labs Reviewed  CBC - Abnormal; Notable for the following:    Hemoglobin 11.8 (*)    All other components within normal limits  BASIC METABOLIC PANEL  I-STAT TROPOININ, ED    Imaging Review Dg Chest 2 View  02/20/2015   CLINICAL DATA:  54 year old female with left-sided chest pain  EXAM: CHEST  2 VIEW  COMPARISON:  Radiograph dated 08/07/2014  FINDINGS: The heart size and mediastinal contours are within normal limits. Both lungs are clear. The visualized skeletal structures are unremarkable.  IMPRESSION: No active cardiopulmonary disease.   Electronically Signed   By: Anner Crete M.D.   On:  02/20/2015 20:52     EKG Interpretation   Date/Time:  Monday February 20 2015 20:20:48 EDT Ventricular Rate:  81 PR Interval:  174 QRS Duration: 74 QT Interval:  400 QTC Calculation: 464 R Axis:   72 Text Interpretation:  Normal sinus rhythm Normal ECG Sinus rhythm Artifact  Abnormal ekg Confirmed by Carmin Muskrat  MD [phone]) on 02/20/2015  10:06:36 PM      MDM   Final diagnoses:  None   Patient presents today with a chief complaint of chest pain.  Pain is atypical.  No ischemic changes on EKG.  Initial and troponin are negative.  CXR is negative.  Patient is PERC negative.  HEART score of 1 putting the patient at low risk.  No chest pain during ED course.  Feel that the patient is stable for discharge.  Instructed to follow up with PCP.  Patient also evaluated by Dr. Vanita Panda who is in agreement with the plan.  Return precautions given.    Hyman Bible, PA-C 02/21/15 0022  Carmin Muskrat, MD 02/21/15 Laureen Abrahams

## 2015-02-20 NOTE — ED Notes (Signed)
Pt. reports intermittent left chest pain onset this evening , denies SOB , no nausea or diaphoresis .

## 2015-02-20 NOTE — Discharge Instructions (Signed)

## 2015-02-22 ENCOUNTER — Emergency Department (HOSPITAL_COMMUNITY): Payer: 59

## 2015-02-22 ENCOUNTER — Observation Stay (HOSPITAL_COMMUNITY)
Admission: EM | Admit: 2015-02-22 | Discharge: 2015-02-23 | Disposition: A | Discharge status: Home / Self Care | Payer: 59 | Attending: Surgery | Admitting: Surgery

## 2015-02-22 ENCOUNTER — Observation Stay (HOSPITAL_COMMUNITY): Payer: 59 | Admitting: Anesthesiology

## 2015-02-22 ENCOUNTER — Encounter (HOSPITAL_COMMUNITY)
Admission: EM | Disposition: A | Discharge status: Home / Self Care | Payer: Self-pay | Source: Home / Self Care | Attending: Emergency Medicine

## 2015-02-22 ENCOUNTER — Encounter (HOSPITAL_COMMUNITY): Payer: Self-pay | Admitting: Emergency Medicine

## 2015-02-22 ENCOUNTER — Observation Stay (HOSPITAL_COMMUNITY): Payer: 59

## 2015-02-22 DIAGNOSIS — K8010 Calculus of gallbladder with chronic cholecystitis without obstruction: Secondary | ICD-10-CM | POA: Diagnosis not present

## 2015-02-22 DIAGNOSIS — K8000 Calculus of gallbladder with acute cholecystitis without obstruction: Secondary | ICD-10-CM | POA: Diagnosis present

## 2015-02-22 DIAGNOSIS — Z419 Encounter for procedure for purposes other than remedying health state, unspecified: Secondary | ICD-10-CM

## 2015-02-22 DIAGNOSIS — R52 Pain, unspecified: Secondary | ICD-10-CM

## 2015-02-22 DIAGNOSIS — R1013 Epigastric pain: Secondary | ICD-10-CM | POA: Diagnosis present

## 2015-02-22 HISTORY — DX: Gastro-esophageal reflux disease without esophagitis: K21.9

## 2015-02-22 HISTORY — PX: CHOLECYSTECTOMY: SHX55

## 2015-02-22 HISTORY — PX: LAPAROSCOPIC CHOLECYSTECTOMY: SUR755

## 2015-02-22 LAB — URINALYSIS, ROUTINE W REFLEX MICROSCOPIC
Bilirubin Urine: NEGATIVE
GLUCOSE, UA: NEGATIVE mg/dL
Hgb urine dipstick: NEGATIVE
Ketones, ur: NEGATIVE mg/dL
LEUKOCYTES UA: NEGATIVE
Nitrite: NEGATIVE
PH: 7 (ref 5.0–8.0)
PROTEIN: NEGATIVE mg/dL
Specific Gravity, Urine: 1.019 (ref 1.005–1.030)
Urobilinogen, UA: 1 mg/dL (ref 0.0–1.0)

## 2015-02-22 LAB — COMPREHENSIVE METABOLIC PANEL
ALBUMIN: 3.9 g/dL (ref 3.5–5.0)
ALT: 148 U/L — ABNORMAL HIGH (ref 14–54)
AST: 194 U/L — ABNORMAL HIGH (ref 15–41)
Alkaline Phosphatase: 134 U/L — ABNORMAL HIGH (ref 38–126)
Anion gap: 8 (ref 5–15)
BILIRUBIN TOTAL: 0.9 mg/dL (ref 0.3–1.2)
BUN: 10 mg/dL (ref 6–20)
CHLORIDE: 105 mmol/L (ref 101–111)
CO2: 26 mmol/L (ref 22–32)
CREATININE: 0.9 mg/dL (ref 0.44–1.00)
Calcium: 8.9 mg/dL (ref 8.9–10.3)
GFR calc Af Amer: >60 mL/min (ref >60)
GFR calc non Af Amer: >60 mL/min (ref >60)
Glucose, Bld: 114 mg/dL — ABNORMAL HIGH (ref 65–99)
Potassium: 3.8 mmol/L (ref 3.5–5.1)
Sodium: 139 mmol/L (ref 135–145)
Total Protein: 7 g/dL (ref 6.5–8.1)

## 2015-02-22 LAB — LIPASE, BLOOD: LIPASE: 151 U/L — AB (ref 22–51)

## 2015-02-22 LAB — CBC
HEMATOCRIT: 37.4 % (ref 36.0–46.0)
Hemoglobin: 12.2 g/dL (ref 12.0–15.0)
MCH: 27.4 pg (ref 26.0–34.0)
MCHC: 32.6 g/dL (ref 30.0–36.0)
MCV: 84 fL (ref 78.0–100.0)
PLATELETS: 273 10*3/uL (ref 150–400)
RBC: 4.45 MIL/uL (ref 3.87–5.11)
RDW: 13.2 % (ref 11.5–15.5)
WBC: 7.1 10*3/uL (ref 4.0–10.5)

## 2015-02-22 LAB — SURGICAL PCR SCREEN
MRSA, PCR: NEGATIVE
Staphylococcus aureus: NEGATIVE

## 2015-02-22 SURGERY — LAPAROSCOPIC CHOLECYSTECTOMY WITH INTRAOPERATIVE CHOLANGIOGRAM
Anesthesia: General | Site: Abdomen

## 2015-02-22 MED ORDER — MORPHINE SULFATE 2 MG/ML IJ SOLN: 1.0000 mg | INTRAMUSCULAR | Status: DC | PRN

## 2015-02-22 MED ORDER — PROPOFOL 10 MG/ML IV BOLUS
INTRAVENOUS | Status: DC | PRN
  Administered 2015-02-22: 200 mg via INTRAVENOUS

## 2015-02-22 MED ORDER — MORPHINE SULFATE 2 MG/ML IJ SOLN
2.0000 mg | Freq: Once | INTRAMUSCULAR | Status: DC
  Filled 2015-02-22 (×2): qty 1

## 2015-02-22 MED ORDER — FENTANYL CITRATE (PF) 250 MCG/5ML IJ SOLN
INTRAMUSCULAR | Status: AC
  Filled 2015-02-22: qty 5

## 2015-02-22 MED ORDER — PIPERACILLIN-TAZOBACTAM 3.375 G IVPB 30 MIN
3.3750 g | Freq: Once | INTRAVENOUS | Status: AC
  Administered 2015-02-22: 3.375 g via INTRAVENOUS
  Filled 2015-02-22: qty 50

## 2015-02-22 MED ORDER — MORPHINE SULFATE 2 MG/ML IJ SOLN
2.0000 mg | Freq: Once | INTRAMUSCULAR | Status: AC
  Administered 2015-02-22: 2 mg via INTRAVENOUS
  Filled 2015-02-22: qty 1

## 2015-02-22 MED ORDER — ZOLPIDEM TARTRATE 5 MG PO TABS: 10.0000 mg | ORAL_TABLET | Freq: Every evening | ORAL | Status: DC | PRN

## 2015-02-22 MED ORDER — KCL IN DEXTROSE-NACL 20-5-0.45 MEQ/L-%-% IV SOLN
INTRAVENOUS | Status: DC
  Administered 2015-02-22 – 2015-02-23 (×2): via INTRAVENOUS
  Filled 2015-02-22 (×2): qty 1000

## 2015-02-22 MED ORDER — LACTATED RINGERS IV SOLN
INTRAVENOUS | Status: DC | PRN
  Administered 2015-02-22: 11:00:00 via INTRAVENOUS

## 2015-02-22 MED ORDER — MORPHINE SULFATE 2 MG/ML IJ SOLN
1.0000 mg | INTRAMUSCULAR | Status: DC | PRN
  Administered 2015-02-22: 2 mg via INTRAVENOUS
  Filled 2015-02-22: qty 1

## 2015-02-22 MED ORDER — ONDANSETRON HCL 4 MG/2ML IJ SOLN
INTRAMUSCULAR | Status: DC | PRN
  Administered 2015-02-22: 4 mg via INTRAVENOUS

## 2015-02-22 MED ORDER — ONDANSETRON HCL 4 MG/2ML IJ SOLN: 4.0000 mg | Freq: Four times a day (QID) | INTRAMUSCULAR | Status: DC | PRN

## 2015-02-22 MED ORDER — FENTANYL CITRATE (PF) 100 MCG/2ML IJ SOLN
INTRAMUSCULAR | Status: DC | PRN
  Administered 2015-02-22: 100 ug via INTRAVENOUS
  Administered 2015-02-22: 50 ug via INTRAVENOUS
  Administered 2015-02-22: 100 ug via INTRAVENOUS

## 2015-02-22 MED ORDER — LACTATED RINGERS IV SOLN: INTRAVENOUS | Status: DC

## 2015-02-22 MED ORDER — ONDANSETRON HCL 4 MG/2ML IJ SOLN: 4.0000 mg | Freq: Once | INTRAMUSCULAR | Status: DC | PRN

## 2015-02-22 MED ORDER — BUPIVACAINE-EPINEPHRINE (PF) 0.25% -1:200000 IJ SOLN
INTRAMUSCULAR | Status: AC
  Filled 2015-02-22: qty 30

## 2015-02-22 MED ORDER — DEXAMETHASONE SODIUM PHOSPHATE 4 MG/ML IJ SOLN
INTRAMUSCULAR | Status: DC | PRN
  Administered 2015-02-22: 8 mg via INTRAVENOUS

## 2015-02-22 MED ORDER — IBUPROFEN 800 MG PO TABS: 800.0000 mg | ORAL_TABLET | Freq: Two times a day (BID) | ORAL | Status: DC | PRN

## 2015-02-22 MED ORDER — DOCUSATE SODIUM 100 MG PO CAPS
100.0000 mg | ORAL_CAPSULE | Freq: Two times a day (BID) | ORAL | Status: DC
  Administered 2015-02-22: 100 mg via ORAL
  Filled 2015-02-22: qty 1

## 2015-02-22 MED ORDER — BUPIVACAINE-EPINEPHRINE 0.25% -1:200000 IJ SOLN
INTRAMUSCULAR | Status: DC | PRN
  Administered 2015-02-22: 20 mL

## 2015-02-22 MED ORDER — SUCCINYLCHOLINE CHLORIDE 20 MG/ML IJ SOLN
INTRAMUSCULAR | Status: DC | PRN
  Administered 2015-02-22: 100 mg via INTRAVENOUS

## 2015-02-22 MED ORDER — PANTOPRAZOLE SODIUM 40 MG PO TBEC: 40.0000 mg | DELAYED_RELEASE_TABLET | Freq: Every day | ORAL | Status: DC

## 2015-02-22 MED ORDER — SODIUM CHLORIDE 0.9 % IV SOLN
3.0000 g | Freq: Four times a day (QID) | INTRAVENOUS | Status: DC
  Administered 2015-02-22 – 2015-02-23 (×5): 3 g via INTRAVENOUS
  Filled 2015-02-22 (×7): qty 3

## 2015-02-22 MED ORDER — IOHEXOL 300 MG/ML  SOLN
INTRAMUSCULAR | Status: DC | PRN
  Administered 2015-02-22: 12 mL via INTRAVENOUS

## 2015-02-22 MED ORDER — DIPHENHYDRAMINE HCL 50 MG/ML IJ SOLN: 12.5000 mg | Freq: Four times a day (QID) | INTRAMUSCULAR | Status: DC | PRN

## 2015-02-22 MED ORDER — DICYCLOMINE HCL 10 MG/ML IM SOLN
20.0000 mg | Freq: Once | INTRAMUSCULAR | Status: AC
  Administered 2015-02-22: 20 mg via INTRAMUSCULAR
  Filled 2015-02-22: qty 2

## 2015-02-22 MED ORDER — DIPHENHYDRAMINE HCL 12.5 MG/5ML PO ELIX: 12.5000 mg | ORAL_SOLUTION | Freq: Four times a day (QID) | ORAL | Status: DC | PRN

## 2015-02-22 MED ORDER — GI COCKTAIL ~~LOC~~
30.0000 mL | Freq: Once | ORAL | Status: AC
  Administered 2015-02-22: 30 mL via ORAL
  Filled 2015-02-22: qty 30

## 2015-02-22 MED ORDER — LIDOCAINE HCL (CARDIAC) 20 MG/ML IV SOLN
INTRAVENOUS | Status: DC | PRN
  Administered 2015-02-22: 80 mg via INTRAVENOUS

## 2015-02-22 MED ORDER — SODIUM CHLORIDE 0.9 % IR SOLN
Status: DC | PRN
  Administered 2015-02-22: 1000 mL

## 2015-02-22 MED ORDER — 0.9 % SODIUM CHLORIDE (POUR BTL) OPTIME
TOPICAL | Status: DC | PRN
  Administered 2015-02-22: 1000 mL

## 2015-02-22 MED ORDER — MIDAZOLAM HCL 2 MG/2ML IJ SOLN
INTRAMUSCULAR | Status: AC
  Filled 2015-02-22: qty 2

## 2015-02-22 MED ORDER — LIDOCAINE HCL 4 % MT SOLN
OROMUCOSAL | Status: DC | PRN
  Administered 2015-02-22: 4 mL via TOPICAL

## 2015-02-22 MED ORDER — OXYCODONE-ACETAMINOPHEN 5-325 MG PO TABS
1.0000 | ORAL_TABLET | ORAL | Status: DC | PRN
  Administered 2015-02-22 – 2015-02-23 (×2): 1 via ORAL
  Filled 2015-02-22: qty 2
  Filled 2015-02-22: qty 1

## 2015-02-22 MED ORDER — PROPOFOL 10 MG/ML IV BOLUS
INTRAVENOUS | Status: AC
  Filled 2015-02-22: qty 20

## 2015-02-22 MED ORDER — KCL IN DEXTROSE-NACL 20-5-0.45 MEQ/L-%-% IV SOLN
INTRAVENOUS | Status: DC
  Administered 2015-02-22: 10:00:00 via INTRAVENOUS
  Filled 2015-02-22: qty 1000

## 2015-02-22 MED ORDER — MORPHINE SULFATE 4 MG/ML IJ SOLN
4.0000 mg | Freq: Once | INTRAMUSCULAR | Status: AC
  Administered 2015-02-22: 4 mg via INTRAVENOUS
  Filled 2015-02-22: qty 1

## 2015-02-22 SURGICAL SUPPLY — 33 items
APPLIER CLIP 5 13 M/L LIGAMAX5 (MISCELLANEOUS) ×3
BLADE SURG CLIPPER 3M 9600 (MISCELLANEOUS) IMPLANT
CANISTER SUCTION 2500CC (MISCELLANEOUS) ×3 IMPLANT
CHLORAPREP W/TINT 26ML (MISCELLANEOUS) ×3 IMPLANT
CLIP APPLIE 5 13 M/L LIGAMAX5 (MISCELLANEOUS) ×1 IMPLANT
COVER MAYO STAND STRL (DRAPES) ×3 IMPLANT
COVER SURGICAL LIGHT HANDLE (MISCELLANEOUS) ×3 IMPLANT
DRAPE C-ARM 42X72 X-RAY (DRAPES) ×3 IMPLANT
ELECT REM PT RETURN 9FT ADLT (ELECTROSURGICAL) ×3
ELECTRODE REM PT RTRN 9FT ADLT (ELECTROSURGICAL) ×1 IMPLANT
GLOVE SURG SIGNA 7.5 PF LTX (GLOVE) ×3 IMPLANT
GOWN STRL REUS W/ TWL LRG LVL3 (GOWN DISPOSABLE) ×2 IMPLANT
GOWN STRL REUS W/ TWL XL LVL3 (GOWN DISPOSABLE) ×1 IMPLANT
GOWN STRL REUS W/TWL LRG LVL3 (GOWN DISPOSABLE) ×4
GOWN STRL REUS W/TWL XL LVL3 (GOWN DISPOSABLE) ×2
KIT BASIN OR (CUSTOM PROCEDURE TRAY) ×3 IMPLANT
KIT ROOM TURNOVER OR (KITS) ×3 IMPLANT
LIQUID BAND (GAUZE/BANDAGES/DRESSINGS) ×3 IMPLANT
NS IRRIG 1000ML POUR BTL (IV SOLUTION) ×3 IMPLANT
PAD ARMBOARD 7.5X6 YLW CONV (MISCELLANEOUS) ×3 IMPLANT
POUCH SPECIMEN RETRIEVAL 10MM (ENDOMECHANICALS) ×3 IMPLANT
SCISSORS LAP 5X35 DISP (ENDOMECHANICALS) ×3 IMPLANT
SET CHOLANGIOGRAPH 5 50 .035 (SET/KITS/TRAYS/PACK) ×3 IMPLANT
SET IRRIG TUBING LAPAROSCOPIC (IRRIGATION / IRRIGATOR) ×3 IMPLANT
SLEEVE ENDOPATH XCEL 5M (ENDOMECHANICALS) ×6 IMPLANT
SPECIMEN JAR SMALL (MISCELLANEOUS) ×3 IMPLANT
SUT MON AB 4-0 PC3 18 (SUTURE) ×3 IMPLANT
TOWEL OR 17X24 6PK STRL BLUE (TOWEL DISPOSABLE) ×3 IMPLANT
TOWEL OR 17X26 10 PK STRL BLUE (TOWEL DISPOSABLE) ×3 IMPLANT
TRAY LAPAROSCOPIC MC (CUSTOM PROCEDURE TRAY) ×3 IMPLANT
TROCAR XCEL BLUNT TIP 100MML (ENDOMECHANICALS) ×3 IMPLANT
TROCAR XCEL NON-BLD 5MMX100MML (ENDOMECHANICALS) ×3 IMPLANT
TUBING INSUFFLATION (TUBING) ×3 IMPLANT

## 2015-02-22 NOTE — Op Note (Signed)
Laparoscopic Cholecystectomy with IOC Procedure Note  Indications: This patient presents with symptomatic gallbladder disease and will undergo laparoscopic cholecystectomy.  Pre-operative Diagnosis: Calculus of gallbladder with acute cholecystitis, without mention of obstruction  Post-operative Diagnosis: Same  Surgeon: [name] A   Assistants: 0  Anesthesia: General endotracheal anesthesia  ASA Class: 2  Procedure Details  The patient was seen again in the Holding Room. The risks, benefits, complications, treatment options, and expected outcomes were discussed with the patient. The possibilities of reaction to medication, pulmonary aspiration, perforation of viscus, bleeding, recurrent infection, finding a normal gallbladder, the need for additional procedures, failure to diagnose a condition, the possible need to convert to an open procedure, and creating a complication requiring transfusion or operation were discussed with the patient. The likelihood of improving the patient's symptoms with return to their baseline status is good.  The patient and/or family concurred with the proposed plan, giving informed consent. The site of surgery properly noted. The patient was taken to Operating Room, identified as Natasha Huffman and the procedure verified as Laparoscopic Cholecystectomy with Intraoperative Cholangiogram. A Time Out was held and the above information confirmed.  Prior to the induction of general anesthesia, antibiotic prophylaxis was administered. General endotracheal anesthesia was then administered and tolerated well. After the induction, the abdomen was prepped with Chloraprep and draped in the sterile fashion. The patient was positioned in the supine position.  Local anesthetic agent was injected into the skin near the umbilicus and an incision made. We dissected down to the abdominal fascia with blunt dissection.  The fascia was incised vertically and we entered the  peritoneal cavity bluntly.  A pursestring suture of 0-Vicryl was placed around the fascial opening.  The Hasson cannula was inserted and secured with the stay suture.  Pneumoperitoneum was then created with CO2 and tolerated well without any adverse changes in the patient's vital signs. A 5-mm port was placed in the subxiphoid position.  Two 5-mm ports were placed in the right upper quadrant. All skin incisions were infiltrated with a local anesthetic agent before making the incision and placing the trocars.   We positioned the patient in reverse Trendelenburg, tilted slightly to the patient's left.  The gallbladder was identified, the fundus grasped and retracted cephalad. Adhesions were lysed bluntly and with the electrocautery where indicated, taking care not to injure any adjacent organs or viscus. The infundibulum was grasped and retracted laterally, exposing the peritoneum overlying the triangle of Calot. This was then divided and exposed in a blunt fashion. A critical view of the cystic duct and cystic artery was obtained.  The cystic duct was clearly identified and bluntly dissected circumferentially. The cystic duct was ligated with a clip distally.   An incision was made in the cystic duct and the [facility] cholangiogram catheter introduced. The catheter was secured using a clip. A cholangiogram was then obtained which showed good visualization of the distal and proximal biliary tree with no sign of filling defects or obstruction.  Contrast flowed easily into the duodenum. The catheter was then removed.   The cystic duct was then ligated with clips and divided. The cystic artery was identified, dissected free, ligated with clips and divided as well.   The gallbladder was dissected from the liver bed in retrograde fashion with the electrocautery. The gallbladder was removed and placed in an Endocatch sac. The liver bed was irrigated and inspected. Hemostasis was achieved with the electrocautery. Copious  irrigation was utilized and was repeatedly aspirated until clear.  The gallbladder and Endocatch sac were then removed through the umbilical port site.  The pursestring suture was used to close the umbilical fascia.    We again inspected the right upper quadrant for hemostasis.  Pneumoperitoneum was released as we removed the trocars.  4-0 Monocryl was used to close the skin.   Skin glue was applied. The patient was then extubated and brought to the recovery room in stable condition. Instrument, sponge, and needle counts were correct at closure and at the conclusion of the case.   Findings: Cholecystitis with Cholelithiasis Normal c-gram  Estimated Blood Loss: Minimal         Drains: 0         Specimens: Gallbladder           Complications: None; patient tolerated the procedure well.         Disposition: PACU - hemodynamically stable.         Condition: stable

## 2015-02-22 NOTE — ED Notes (Signed)
Onset 2 weeks ago RUQ abdominal pain and states Doctor told patient she has gallstones. Today took motrin however did not provide relief.

## 2015-02-22 NOTE — ED Provider Notes (Signed)
CSN: [id number]   Arrival date & time 02/22/15 8938  History  This chart was scribed for  Natasha Ralls, MD by Altamease Oiler, ED Scribe. This patient was seen in room D31C/D31C and the patient's care was started at 1:24 AM.  Chief Complaint  Patient presents with  . Abdominal Pain    HPI Patient is a 54 y.o. female presenting with abdominal pain. The history is provided by the patient. No language interpreter was used.  Abdominal Pain Pain location:  Epigastric and RUQ Pain quality: sharp   Pain radiates to:  Back Pain severity:  Severe Onset quality:  Gradual Duration:  5 weeks Timing:  Intermittent Progression:  Unchanged Chronicity:  Recurrent Context: eating   Context: not sick contacts   Relieved by:  Nothing Worsened by:  Nothing tried Ineffective treatments:  NSAIDs Associated symptoms: belching   Associated symptoms: no chest pain, no constipation, no diarrhea, no nausea and no vomiting   Risk factors: has not had multiple surgeries    Natasha Huffman is a 54 y.o. female with PMHx of gallstones who presents to the Emergency Department complaining of intermittent upper mid abdominal pain with onset 1 month ago. The pain worsened tonight around 7:30 PM after eating rice, green beans, and skinless baked chicken.Pt notes that the pain is typically worse after eating but that it was still present while she was fasting for Ramadan.  She rates the pain 10/10 in severity and describes it as sharp. Pt notes pain radiation to the back. Ibuprofen (which she takes daily) and Nexium around 7:30 PM provided insufficient relief PTA. Last bowel movement was yesterday. Pt was seen as an outpatient a few weeks ago and diagnosed with gallstones. Associated symptoms include belching. Pt denies foul taste in the mouth.   Past Medical History  Diagnosis Date  . Gallstones     Past Surgical History  Procedure Laterality Date  . Cesarean section    . Tubal ligation      Family History   Problem Relation Age of Onset  . Hypertension Sister     History  Substance Use Topics  . Smoking status: Never Smoker   . Smokeless tobacco: Not on file  . Alcohol Use: No     Review of Systems  Cardiovascular: Negative for chest pain.  Gastrointestinal: Positive for abdominal pain. Negative for nausea, vomiting, diarrhea and constipation.       Belching  All other systems reviewed and are negative.   Home Medications   Prior to Admission medications   Medication Sig Start Date End Date Taking? Authorizing Provider  Esomeprazole Magnesium (NEXIUM PO) Take 1 tablet by mouth daily. Dose: 22.3mg     Historical Provider, MD  ibuprofen (ADVIL,MOTRIN) 200 MG tablet Take 200 mg by mouth 2 (two) times daily as needed for moderate pain.     Historical Provider, MD  zolpidem (AMBIEN) 10 MG tablet Take 10 mg by mouth at bedtime as needed for sleep.    Historical Provider, MD    Allergies  Review of patient's allergies indicates no known allergies.  Triage Vitals: BP 156/81 mmHg  Pulse 82  Temp(Src) 98.2 F (36.8 C)  Resp 16  Ht 5\' 2"  (1.575 m)  Wt 174 lb (78.926 kg)  BMI 31.82 kg/m2  SpO2 99%  Physical Exam  Constitutional: She is oriented to person, place, and time. She appears well-developed and well-nourished. No distress.  HENT:  Head: Normocephalic and atraumatic.  Mouth/Throat: Oropharynx is clear and moist. No oropharyngeal  exudate.  Eyes: Conjunctivae and EOM are normal. Pupils are equal, round, and reactive to light.  Neck: Normal range of motion. Neck supple. No tracheal deviation present.  Cardiovascular: Normal rate, regular rhythm and intact distal pulses.   Pulmonary/Chest: Effort normal and breath sounds normal. No respiratory distress. She has no wheezes. She has no rales.  Abdominal: Soft. She exhibits no distension. Bowel sounds are increased. There is tenderness in the epigastric area. There is no rigidity, no rebound, no guarding and no tenderness at  McBurney's point.  Pain post [place] has moved to RUQ   Musculoskeletal: Normal range of motion.  Neurological: She is alert and oriented to person, place, and time.  Skin: Skin is warm and dry.  Psychiatric: She has a normal mood and affect. Her behavior is normal.  Nursing note and vitals reviewed.   ED Course  Procedures   DIAGNOSTIC STUDIES: Oxygen Saturation is 99% on RA, normal by my interpretation.    COORDINATION OF CARE: 1:29 AM Discussed treatment plan which includes abdominal XR, lab work and GI cocktail, and Bentyl with pt at bedside and pt agreed to plan.  Labs Review-  Labs Reviewed  LIPASE, BLOOD  COMPREHENSIVE METABOLIC PANEL  CBC  URINALYSIS, ROUTINE W REFLEX MICROSCOPIC (NOT AT [facility])    Imaging Review Dg Chest 2 View  02/20/2015   CLINICAL DATA:  54 year old female with left-sided chest pain  EXAM: CHEST  2 VIEW  COMPARISON:  Radiograph dated 08/07/2014  FINDINGS: The heart size and mediastinal contours are within normal limits. Both lungs are clear. The visualized skeletal structures are unremarkable.  IMPRESSION: No active cardiopulmonary disease.   Electronically Signed   By: Anner Crete M.D.   On: 02/20/2015 20:52    EKG Interpretation None      MDM   Final diagnoses:  None   Medications  morphine 2 MG/ML injection 2 mg (2 mg Intravenous Not Given 02/22/15 0425)  morphine 2 MG/ML injection 2 mg (2 mg Intravenous Not Given 02/22/15 0425)  piperacillin-tazobactam (ZOSYN) IVPB 3.375 g (not administered)  gi cocktail (Maalox,Lidocaine,Donnatal) (30 mLs Oral Given 02/22/15 0139)  dicyclomine (BENTYL) injection 20 mg (20 mg Intramuscular Given 02/22/15 0231)  morphine 4 MG/ML injection 4 mg (4 mg Intravenous Given 02/22/15 0321)   Results for orders placed or performed during the hospital encounter of 02/22/15  Lipase, blood  Result Value Ref Range   Lipase 151 (H) 22 - 51 U/L  Comprehensive metabolic panel  Result Value Ref Range   Sodium 139 135  - 145 mmol/L   Potassium 3.8 3.5 - 5.1 mmol/L   Chloride 105 101 - 111 mmol/L   CO2 26 22 - 32 mmol/L   Glucose, Bld 114 (H) 65 - 99 mg/dL   BUN 10 6 - 20 mg/dL   Creatinine, Ser 0.90 0.44 - 1.00 mg/dL   Calcium 8.9 8.9 - 10.3 mg/dL   Total Protein 7.0 6.5 - 8.1 g/dL   Albumin 3.9 3.5 - 5.0 g/dL   AST 194 (H) 15 - 41 U/L   ALT 148 (H) 14 - 54 U/L   Alkaline Phosphatase 134 (H) 38 - 126 U/L   Total Bilirubin 0.9 0.3 - 1.2 mg/dL   GFR calc non Af Amer >60 >60 mL/min   GFR calc Af Amer >60 >60 mL/min   Anion gap 8 5 - 15  CBC  Result Value Ref Range   WBC 7.1 4.0 - 10.5 K/uL   RBC 4.45 3.87 - 5.11  MIL/uL   Hemoglobin 12.2 12.0 - 15.0 g/dL   HCT 37.4 36.0 - 46.0 %   MCV 84.0 78.0 - 100.0 fL   MCH 27.4 26.0 - 34.0 pg   MCHC 32.6 30.0 - 36.0 g/dL   RDW 13.2 11.5 - 15.5 %   Platelets 273 150 - 400 K/uL  Urinalysis, Routine w reflex microscopic (not at [facility])  Result Value Ref Range   Color, Urine YELLOW YELLOW   APPearance CLOUDY (A) CLEAR   Specific Gravity, Urine 1.019 1.005 - 1.030   pH 7.0 5.0 - 8.0   Glucose, UA NEGATIVE NEGATIVE mg/dL   Hgb urine dipstick NEGATIVE NEGATIVE   Bilirubin Urine NEGATIVE NEGATIVE   Ketones, ur NEGATIVE NEGATIVE mg/dL   Protein, ur NEGATIVE NEGATIVE mg/dL   Urobilinogen, UA 1.0 0.0 - 1.0 mg/dL   Nitrite NEGATIVE NEGATIVE   Leukocytes, UA NEGATIVE NEGATIVE   Dg Chest 2 View  02/20/2015   CLINICAL DATA:  54 year old female with left-sided chest pain  EXAM: CHEST  2 VIEW  COMPARISON:  Radiograph dated 08/07/2014  FINDINGS: The heart size and mediastinal contours are within normal limits. Both lungs are clear. The visualized skeletal structures are unremarkable.  IMPRESSION: No active cardiopulmonary disease.   Electronically Signed   By: Anner Crete M.D.   On: 02/20/2015 20:52   US Abdomen Complete  02/22/2015   CLINICAL DATA:  Abdominal pain, onset 2 weeks ago, worse tonight after eating.  EXAM: ULTRASOUND ABDOMEN COMPLETE  COMPARISON:   02/06/2015  FINDINGS: Gallbladder: Small stones and sludge in the dependent portion the gallbladder, similar to prior study. No gallbladder wall thickening. Murphy's sign is positive.  Common bile duct: Diameter: 7 mm, upper limits of normal  Liver: No focal lesion identified. Within normal limits in parenchymal echogenicity.  IVC: No abnormality visualized.  Pancreas: Pancreatic duct measures 2 mm diameter, upper limits of normal. Pancreas otherwise unremarkable.  Spleen: Size and appearance within normal limits.  Right Kidney: Length: 9.8 cm. Echogenicity within normal limits. No mass or hydronephrosis visualized.  Left Kidney: Length: 10.3 cm. Echogenicity within normal limits. No mass or hydronephrosis visualized.  Abdominal aorta: No aneurysm visualized.  Other findings: None.  IMPRESSION: Cholelithiasis and sludge in the gallbladder. Positive Murphy's sign. These findings are compatible with acute cholecystitis in the appropriate clinical setting.   Electronically Signed   By: Lucienne Capers M.D.   On: 02/22/2015 02:30   US Abdomen Complete  02/06/2015   CLINICAL DATA:  Four weeks of upper abdominal pain, elevated liver enzymes.  EXAM: ULTRASOUND ABDOMEN COMPLETE  COMPARISON:  None.  FINDINGS: Gallbladder: The gallbladder is adequately distended. There are multiple echogenic mobile shadowing stones. There is no gallbladder wall thickening, pericholecystic fluid, or reported positive sonographic Murphy's sign.  Common bile duct: Diameter: 4.1 mm.  Liver: The liver exhibits normal echotexture with no focal mass nor ductal dilation.  IVC: No abnormality visualized.  Pancreas: Visualized portion unremarkable.  Spleen: Size and appearance within normal limits.  Right Kidney: Length: 10.4 cm. Echogenicity within normal limits. No mass or hydronephrosis visualized.  Left Kidney: Length: 10.1 cm. There are parapelvic cysts. There is no solid mass. Cortical echogenicity is normal.  Abdominal aorta: No aneurysm  visualized.  Other findings: There is no ascites.  IMPRESSION: 1. Gallstones without sonographic evidence of acute cholecystitis. 2. The liver and pancreas and spleen are unremarkable. 3. Probable parapelvic cysts in the left kidney.   Electronically Signed   By: David  Martinique M.D.  On: 02/06/2015 11:28   Dg Abd Acute W/chest  02/22/2015   CLINICAL DATA:  Centralized epigastric pain since 7 p.m. tonight after eating. History reflux.  EXAM: DG ABDOMEN ACUTE W/ 1V CHEST  COMPARISON:  Chest 02/20/2015  FINDINGS: Normal heart size and pulmonary vascularity. No focal airspace disease or consolidation in the lungs. No blunting of costophrenic angles. No pneumothorax. Mediastinal contours appear intact.  Scattered gas and stool in the colon. No small or large bowel distention. No free intra-abdominal air. No abnormal air-fluid levels. No radiopaque stones. Visualized bones appear intact.  IMPRESSION: Negative abdominal radiographs.  No acute cardiopulmonary disease.   Electronically Signed   By: Lucienne Capers M.D.   On: 02/22/2015 02:37    D/w Dr. Barry Dienes, zosyn and pain medication to be seen  I personally performed the services described in this documentation, which was scribed in my presence. The recorded information has been reviewed and is accurate.     Veatrice Kells, MD 02/22/15 [phone]

## 2015-02-22 NOTE — Transfer of Care (Signed)
Immediate Anesthesia Transfer of Care Note  Patient: Natasha Huffman  Procedure(s) Performed: Procedure(s): LAPAROSCOPIC CHOLECYSTECTOMY WITH INTRAOPERATIVE CHOLANGIOGRAM (N/A)  Patient Location: PACU  Anesthesia Type:General  Level of Consciousness: awake, alert  and patient cooperative  Airway & Oxygen Therapy: Patient Spontanous Breathing and Patient connected to nasal cannula oxygen  Post-op Assessment: Report given to RN and Post -op Vital signs reviewed and stable  Post vital signs: Reviewed and stable  Last Vitals:  Filed Vitals:   02/22/15 0800  BP: 118/68  Pulse: 67  Temp: 36.7 C  Resp: 18    Complications: No apparent anesthesia complications

## 2015-02-22 NOTE — Anesthesia Postprocedure Evaluation (Signed)
  Anesthesia Post-op Note  Patient: Natasha Huffman  Procedure(s) Performed: Procedure(s): LAPAROSCOPIC CHOLECYSTECTOMY WITH INTRAOPERATIVE CHOLANGIOGRAM (N/A)  Patient Location: PACU  Anesthesia Type:General  Level of Consciousness: awake, alert , oriented and patient cooperative  Airway and Oxygen Therapy: Patient Spontanous Breathing  Post-op Pain: mild  Post-op Assessment: Post-op Vital signs reviewed, Patient's Cardiovascular Status Stable, Respiratory Function Stable, Patent Airway, No signs of Nausea or vomiting and Pain level controlled              Post-op Vital Signs: stable  Last Vitals:  Filed Vitals:   02/22/15 1215  BP: 115/67  Pulse: 82  Temp:   Resp: 18    Complications: No apparent anesthesia complications

## 2015-02-22 NOTE — Progress Notes (Signed)
Patient ID: Natasha Huffman, female   DOB: 01-May-1961, 54 y.o.   MRN: [id number]  Patient with acute cholecystitis Laparoscopic cholecystectomy with possible cholangiogram is recommended.  I discussed the procedure in detail.   We discussed the risks and benefits of a laparoscopic cholecystectomy and possible cholangiogram including, but not limited to bleeding, infection, injury to surrounding structures such as the intestine or liver, bile leak, retained gallstones, need to convert to an open procedure, prolonged diarrhea, blood clots such as  DVT, common bile duct injury, anesthesia risks, and possible need for additional procedures.  The likelihood of improvement in symptoms and return to the patient's normal status is good. We discussed the typical post-operative recovery course.

## 2015-02-22 NOTE — H&P (Signed)
Natasha Huffman is an 54 y.o. female.   Chief Complaint: RUQ/epigastric pain.   HPI:  Patient is a 54 yo F who presents with worsening abdominal pain that started around 4-6 weeks ago.  It is very sharp and severe and radiates to the back.  It started before Ramadan after she would eat, but it continued even when she was fasting.  She was placed on a PPI.  This did not really help it.  It started as episodic.  She has also tried NSAIDs without relief.  The pain at its worst is 10/10.  She describes belching and bloating in association.  She was diagnoses with gallstones as an outpatient several weeks ago.  She denies nausea/diarrhea/vomiting/constipation.  She has not had fevers or jaundice.  Prior to last night, the episodes were always self resolving.  This time, the pain did not receed until she came to the ED and got pain medication.    Past Medical History  Diagnosis Date  . Gallstones     Past Surgical History  Procedure Laterality Date  . Cesarean section    . Tubal ligation      Family History  Problem Relation Age of Onset  . Hypertension Sister    Social History:  reports that she has never smoked. She does not have any smokeless tobacco history on file. She reports that she does not drink alcohol or use illicit drugs.  Allergies: No Known Allergies  medications Nexium Ibuprofen Ambien  Results for orders placed or performed during the hospital encounter of 02/22/15 (from the past 48 hour(s))  Lipase, blood     Status: Abnormal   Collection Time: 02/22/15  1:17 AM  Result Value Ref Range   Lipase 151 (H) 22 - 51 U/L  Comprehensive metabolic panel     Status: Abnormal   Collection Time: 02/22/15  1:17 AM  Result Value Ref Range   Sodium 139 135 - 145 mmol/L   Potassium 3.8 3.5 - 5.1 mmol/L   Chloride 105 101 - 111 mmol/L   CO2 26 22 - 32 mmol/L   Glucose, Bld 114 (H) 65 - 99 mg/dL   BUN 10 6 - 20 mg/dL   Creatinine, Ser 0.90 0.44 - 1.00 mg/dL   Calcium 8.9 8.9 -  10.3 mg/dL   Total Protein 7.0 6.5 - 8.1 g/dL   Albumin 3.9 3.5 - 5.0 g/dL   AST 194 (H) 15 - 41 U/L   ALT 148 (H) 14 - 54 U/L   Alkaline Phosphatase 134 (H) 38 - 126 U/L   Total Bilirubin 0.9 0.3 - 1.2 mg/dL   GFR calc non Af Amer >60 >60 mL/min   GFR calc Af Amer >60 >60 mL/min    Comment: (NOTE) The eGFR has been calculated using the CKD EPI equation. This calculation has not been validated in all clinical situations. eGFR's persistently <60 mL/min signify possible Chronic Kidney Disease.    Anion gap 8 5 - 15  CBC     Status: None   Collection Time: 02/22/15  1:17 AM  Result Value Ref Range   WBC 7.1 4.0 - 10.5 K/uL   RBC 4.45 3.87 - 5.11 MIL/uL   Hemoglobin 12.2 12.0 - 15.0 g/dL   HCT 37.4 36.0 - 46.0 %   MCV 84.0 78.0 - 100.0 fL   MCH 27.4 26.0 - 34.0 pg   MCHC 32.6 30.0 - 36.0 g/dL   RDW 13.2 11.5 - 15.5 %   Platelets 273  150 - 400 K/uL  Urinalysis, Routine w reflex microscopic (not at [facility])     Status: Abnormal   Collection Time: 02/22/15  1:18 AM  Result Value Ref Range   Color, Urine YELLOW YELLOW   APPearance CLOUDY (A) CLEAR   Specific Gravity, Urine 1.019 1.005 - 1.030   pH 7.0 5.0 - 8.0   Glucose, UA NEGATIVE NEGATIVE mg/dL   Hgb urine dipstick NEGATIVE NEGATIVE   Bilirubin Urine NEGATIVE NEGATIVE   Ketones, ur NEGATIVE NEGATIVE mg/dL   Protein, ur NEGATIVE NEGATIVE mg/dL   Urobilinogen, UA 1.0 0.0 - 1.0 mg/dL   Nitrite NEGATIVE NEGATIVE   Leukocytes, UA NEGATIVE NEGATIVE    Comment: MICROSCOPIC NOT DONE ON URINES WITH NEGATIVE PROTEIN, BLOOD, LEUKOCYTES, NITRITE, OR GLUCOSE <1000 mg/dL.   Dg Chest 2 View  02/20/2015   CLINICAL DATA:  54 year old female with left-sided chest pain  EXAM: CHEST  2 VIEW  COMPARISON:  Radiograph dated 08/07/2014  FINDINGS: The heart size and mediastinal contours are within normal limits. Both lungs are clear. The visualized skeletal structures are unremarkable.  IMPRESSION: No active cardiopulmonary disease.   Electronically  Signed   By: Anner Crete M.D.   On: 02/20/2015 20:52   US Abdomen Complete  02/22/2015   CLINICAL DATA:  Abdominal pain, onset 2 weeks ago, worse tonight after eating.  EXAM: ULTRASOUND ABDOMEN COMPLETE  COMPARISON:  02/06/2015  FINDINGS: Gallbladder: Small stones and sludge in the dependent portion the gallbladder, similar to prior study. No gallbladder wall thickening. Murphy's sign is positive.  Common bile duct: Diameter: 7 mm, upper limits of normal  Liver: No focal lesion identified. Within normal limits in parenchymal echogenicity.  IVC: No abnormality visualized.  Pancreas: Pancreatic duct measures 2 mm diameter, upper limits of normal. Pancreas otherwise unremarkable.  Spleen: Size and appearance within normal limits.  Right Kidney: Length: 9.8 cm. Echogenicity within normal limits. No mass or hydronephrosis visualized.  Left Kidney: Length: 10.3 cm. Echogenicity within normal limits. No mass or hydronephrosis visualized.  Abdominal aorta: No aneurysm visualized.  Other findings: None.  IMPRESSION: Cholelithiasis and sludge in the gallbladder. Positive Murphy's sign. These findings are compatible with acute cholecystitis in the appropriate clinical setting.   Electronically Signed   By: Lucienne Capers M.D.   On: 02/22/2015 02:30   Dg Abd Acute W/chest  02/22/2015   CLINICAL DATA:  Centralized epigastric pain since 7 p.m. tonight after eating. History reflux.  EXAM: DG ABDOMEN ACUTE W/ 1V CHEST  COMPARISON:  Chest 02/20/2015  FINDINGS: Normal heart size and pulmonary vascularity. No focal airspace disease or consolidation in the lungs. No blunting of costophrenic angles. No pneumothorax. Mediastinal contours appear intact.  Scattered gas and stool in the colon. No small or large bowel distention. No free intra-abdominal air. No abnormal air-fluid levels. No radiopaque stones. Visualized bones appear intact.  IMPRESSION: Negative abdominal radiographs.  No acute cardiopulmonary disease.    Electronically Signed   By: Lucienne Capers M.D.   On: 02/22/2015 02:37    Review of Systems  Constitutional: Negative for fever.  HENT: Negative.   Eyes: Negative.   Respiratory: Negative.   Cardiovascular: Negative.   Gastrointestinal: Positive for abdominal pain. Negative for heartburn, nausea and diarrhea.  Genitourinary: Negative.   Musculoskeletal: Positive for back pain.  Skin: Negative.   Neurological: Negative.   Endo/Heme/Allergies: Negative.   Psychiatric/Behavioral: Negative.     Blood pressure 111/66, pulse 74, temperature 98.2 F (36.8 C), resp. rate 16, height 5' 2" (  1.575 m), weight 78.926 kg (174 lb), SpO2 97 %. Physical Exam  Constitutional: She is oriented to person, place, and time. She appears well-developed and well-nourished. She appears distressed (looks uncomfortable).  HENT:  Head: Normocephalic and atraumatic.  Eyes: Conjunctivae are normal. Pupils are equal, round, and reactive to light. No scleral icterus.  Neck: Normal range of motion. Neck supple.  Cardiovascular: Normal rate and regular rhythm.   Respiratory: Effort normal. No respiratory distress.  GI: Soft. She exhibits no distension. There is tenderness (RUQ tenderness). There is no rebound and no guarding.  Musculoskeletal: Normal range of motion.  Lymphadenopathy:    She has no cervical adenopathy.  Neurological: She is alert and oriented to person, place, and time.  Skin: Skin is warm and dry. No rash noted. She is not diaphoretic. No erythema. No pallor.  Psychiatric: She has a normal mood and affect. Her behavior is normal. Judgment and thought content normal.     Assessment/Plan Acute calculous cholecystitis Admit to the floor IVF IV antibiotics NPO Plan lap chole with possible cholangiogram with Dr. Ninfa Linden.     Discussed risks and benefits with patient as well as post op recovery and restrictions.    BYERLY,FAERA 02/22/2015, 6:37 AM

## 2015-02-22 NOTE — Anesthesia Procedure Notes (Signed)
Procedure Name: Intubation Date/Time: 02/22/2015 11:08 AM Performed by: Manuela Schwartz B Pre-anesthesia Checklist: Patient identified, Emergency Drugs available, Suction available, Patient being monitored and Timeout performed Patient Re-evaluated:Patient Re-evaluated prior to inductionOxygen Delivery Method: Circle system utilized Preoxygenation: Pre-oxygenation with 100% oxygen Intubation Type: IV induction and Rapid sequence Laryngoscope Size: Mac and 3 Grade View: Grade I Tube type: Oral Tube size: 7.5 mm Number of attempts: 1 Airway Equipment and Method: Stylet Placement Confirmation: ETT inserted through vocal cords under direct vision,  positive ETCO2 and breath sounds checked- equal and bilateral Secured at: 21 cm Tube secured with: Tape Dental Injury: Teeth and Oropharynx as per pre-operative assessment

## 2015-02-22 NOTE — Anesthesia Preprocedure Evaluation (Addendum)
Anesthesia Evaluation  Patient identified by MRN, date of birth, ID band Patient awake    Reviewed: Allergy & Precautions, NPO status , Patient's Chart, lab work & pertinent test results  History of Anesthesia Complications Negative for: history of anesthetic complications  Airway Mallampati: I  TM Distance: >3 FB Neck ROM: Full    Dental  (+) Teeth Intact, Dental Advisory Given   Pulmonary    Pulmonary exam normal       Cardiovascular Normal cardiovascular exam    Neuro/Psych    GI/Hepatic   Endo/Other    Renal/GU      Musculoskeletal   Abdominal   Peds  Hematology   Anesthesia Other Findings   Reproductive/Obstetrics                            Anesthesia Physical Anesthesia Plan  ASA: I  Anesthesia Plan: General   Post-op Pain Management:    Induction: Intravenous  Airway Management Planned: Oral ETT  Additional Equipment:   Intra-op Plan:   Post-operative Plan: Extubation in OR  Informed Consent: I have reviewed the patients History and Physical, chart, labs and discussed the procedure including the risks, benefits and alternatives for the proposed anesthesia with the patient or authorized representative who has indicated his/her understanding and acceptance.     Plan Discussed with: CRNA, Anesthesiologist and Surgeon  Anesthesia Plan Comments:         Anesthesia Quick Evaluation

## 2015-02-23 ENCOUNTER — Encounter (HOSPITAL_COMMUNITY): Payer: Self-pay | Admitting: Surgery

## 2015-02-23 MED ORDER — OXYCODONE-ACETAMINOPHEN 5-325 MG PO TABS: 1.0000 | ORAL_TABLET | Freq: Four times a day (QID) | ORAL | Status: DC | PRN

## 2015-02-23 MED ORDER — DOCUSATE SODIUM 100 MG PO CAPS: 100.0000 mg | ORAL_CAPSULE | Freq: Two times a day (BID) | ORAL | Status: DC | PRN

## 2015-02-23 NOTE — Discharge Instructions (Signed)
Your appointment is at 3:45pm, please arrive at least 30 min before your appointment to complete your check in paperwork.  If you are unable to arrive 30 min prior to your appointment time we may have to cancel or reschedule you.  LAPAROSCOPIC SURGERY: POST OP INSTRUCTIONS  1. DIET: Follow a light bland diet the first 24 hours after arrival home, such as soup, liquids, crackers, etc. Be sure to include lots of fluids daily. Avoid fast food or heavy meals as your are more likely to get nauseated. Eat a low fat the next few days after surgery.  2. Take your usually prescribed home medications unless otherwise directed. 3. PAIN CONTROL:  1. Pain is best controlled by a usual combination of three different methods TOGETHER:  1. Ice/Heat 2. Over the counter pain medication 3. Prescription pain medication 2. Most patients will experience some swelling and bruising around the incisions. Ice packs or heating pads (30-60 minutes up to 6 times a day) will help. Use ice for the first few days to help decrease swelling and bruising, then switch to heat to help relax tight/sore spots and speed recovery. Some people prefer to use ice alone, heat alone, alternating between ice & heat. Experiment to what works for you. Swelling and bruising can take several weeks to resolve.  3. It is helpful to take an over-the-counter pain medication regularly for the first few weeks. Choose one of the following that works best for you:  1. Naproxen (Aleve, etc) Two 220mg  tabs twice a day 2. Ibuprofen (Advil, etc) Three 200mg  tabs four times a day (every meal & bedtime) 3. Acetaminophen (Tylenol, etc) 500-650mg  four times a day (every meal & bedtime) 4. A prescription for pain medication (such as oxycodone, hydrocodone, etc) should be given to you upon discharge. Take your pain medication as prescribed.  1. If you are having problems/concerns with the prescription medicine (does not control pain, nausea, vomiting, rash, itching,  etc), please call us [phone] to see if we need to switch you to a different pain medicine that will work better for you and/or control your side effect better. 2. If you need a refill on your pain medication, please contact your pharmacy. They will contact our office to request authorization. Prescriptions will not be filled after 5 pm or on week-ends. 4. Avoid getting constipated. Between the surgery and the pain medications, it is common to experience some constipation. Increasing fluid intake and taking a fiber supplement (such as Metamucil, Citrucel, FiberCon, MiraLax, etc) 1-2 times a day regularly will usually help prevent this problem from occurring. A mild laxative (prune juice, Milk of Magnesia, MiraLax, etc) should be taken according to package directions if there are no bowel movements after 48 hours.  5. Watch out for diarrhea. If you have many loose bowel movements, simplify your diet to bland foods & liquids for a few days. Stop any stool softeners and decrease your fiber supplement. Switching to mild anti-diarrheal medications (Kayopectate, Pepto Bismol) can help. If this worsens or does not improve, please call us. 6. Wash / shower every day. You may shower over the dressings as they are waterproof. Continue to shower over incision(s) after the dressing is off. 7. Remove your waterproof bandages 5 days after surgery. You may leave the incision open to air. You may replace a dressing/Band-Aid to cover the incision for comfort if you wish.  8. ACTIVITIES as tolerated:  1. You may resume regular (light) daily activities beginning the next day--such as  walking, climbing stairs--gradually increasing activities as tolerated. If you can walk 30 minutes without difficulty, it is safe to try more intense activity such as jogging, treadmill, bicycling, low-impact aerobics, swimming, etc. °2. Save the most intensive and strenuous activity for last such as sit-ups, heavy lifting,  contact sports, etc Refrain from any heavy lifting or straining until you are off narcotics for pain control.  °3. DO NOT PUSH THROUGH PAIN. Let pain be your guide: If it hurts to do something, don't do it. Pain is your body warning you to avoid that activity for another week until the pain goes down. °4. You may drive when you are no longer taking prescription pain medication, you can comfortably wear a seatbelt, and you can safely maneuver your car and apply brakes. °5. You may have sexual intercourse when it is comfortable.  °9. FOLLOW UP in our office  °1. Please call [facility] at [phone] to set up an appointment to see your surgeon in the office for a follow-up appointment approximately 2-3 weeks after your surgery. °2. Make sure that you call for this appointment the day you arrive home to insure a convenient appointment time. °     10. IF YOU HAVE DISABILITY OR FAMILY LEAVE FORMS, BRING THEM TO THE               OFFICE FOR PROCESSING.  ° °WHEN TO CALL US [phone]:  °1. Poor pain control °2. Reactions / problems with new medications (rash/itching, nausea, etc)  °3. Fever over 101.5 F (38.5 C) °4. Inability to urinate °5. Nausea and/or vomiting °6. Worsening swelling or bruising °7. Continued bleeding from incision. °8. Increased pain, redness, or drainage from the incision ° °The clinic staff is available to answer your questions during regular business hours (8:30am-5pm). Please don’t hesitate to call and ask to speak to one of our nurses for clinical concerns.  °If you have a medical emergency, go to the nearest emergency room or call 911.  °A surgeon from [facility] is always on call at the hospitals  ° °[facility]  °[address], [address], [place], [place] [address] ?  °MAIN: [phone] ? TOLL FREE: [phone] ?  °FAX [phone]  °[email or web address] ° °

## 2015-02-23 NOTE — Progress Notes (Signed)
[facility] Discharge Summary   Patient ID: FELICE HOPE MRN: [id number] DOB/AGE: 1961-03-05 54 y.o.  Admit date: 02/22/2015 Discharge date: 02/23/2015  Admitting Diagnosis: Acute calculous cholecystitis  Discharge Diagnosis Patient Active Problem List   Diagnosis Date Noted  . Acute calculous cholecystitis 02/22/2015  . Knee pain 05/04/2012    Consultants None  Imaging: Dg Cholangiogram Operative  02/22/2015   CLINICAL DATA:  Cholelithiasis.  EXAM: INTRAOPERATIVE CHOLANGIOGRAM  TECHNIQUE: Cholangiographic images from the C-arm fluoroscopic device were submitted for interpretation post-operatively. Please see the procedural report for the amount of contrast and the fluoroscopy time utilized.  COMPARISON:  Ultrasound of same day.  FLUOROSCOPY TIME:  9 seconds.  FINDINGS: Single cine sequence and single fluoroscopic image were obtained. Contrast was injected through cannulated cystic duct remnant. Mild dilatation common bile duct is noted, but no significant intrahepatic biliary dilatation is noted. No filling defect is seen to suggest residual stone. Antegrade flow into duodenum is noted.  IMPRESSION: No evidence of common bile duct stone.   Electronically Signed   By: Marijo Conception, M.D.   On: 02/22/2015 12:18   US Abdomen Complete  02/22/2015   CLINICAL DATA:  Abdominal pain, onset 2 weeks ago, worse tonight after eating.  EXAM: ULTRASOUND ABDOMEN COMPLETE  COMPARISON:  02/06/2015  FINDINGS: Gallbladder: Small stones and sludge in the dependent portion the gallbladder, similar to prior study. No gallbladder wall thickening. Murphy's sign is positive.  Common bile duct: Diameter: 7 mm, upper limits of normal  Liver: No focal lesion identified. Within normal limits in parenchymal echogenicity.  IVC: No abnormality visualized.  Pancreas: Pancreatic duct measures 2 mm diameter, upper limits of normal. Pancreas otherwise unremarkable.  Spleen: Size and appearance within  normal limits.  Right Kidney: Length: 9.8 cm. Echogenicity within normal limits. No mass or hydronephrosis visualized.  Left Kidney: Length: 10.3 cm. Echogenicity within normal limits. No mass or hydronephrosis visualized.  Abdominal aorta: No aneurysm visualized.  Other findings: None.  IMPRESSION: Cholelithiasis and sludge in the gallbladder. Positive Murphy's sign. These findings are compatible with acute cholecystitis in the appropriate clinical setting.   Electronically Signed   By: Lucienne Capers M.D.   On: 02/22/2015 02:30   Dg Abd Acute W/chest  02/22/2015   CLINICAL DATA:  Centralized epigastric pain since 7 p.m. tonight after eating. History reflux.  EXAM: DG ABDOMEN ACUTE W/ 1V CHEST  COMPARISON:  Chest 02/20/2015  FINDINGS: Normal heart size and pulmonary vascularity. No focal airspace disease or consolidation in the lungs. No blunting of costophrenic angles. No pneumothorax. Mediastinal contours appear intact.  Scattered gas and stool in the colon. No small or large bowel distention. No free intra-abdominal air. No abnormal air-fluid levels. No radiopaque stones. Visualized bones appear intact.  IMPRESSION: Negative abdominal radiographs.  No acute cardiopulmonary disease.   Electronically Signed   By: Lucienne Capers M.D.   On: 02/22/2015 02:37    Procedures Dr. Ninfa Linden (02/22/15) - Laparoscopic Cholecystectomy with NEGATIVE [facility]   Hospital Course:  54 yo F who presents with worsening abdominal pain that started around 4-6 weeks ago. It is very sharp and severe and radiates to the back. It started before Ramadan after she would eat, but it continued even when she was fasting. She was placed on a PPI. This did not really help it. It started as episodic. She has also tried NSAIDs without relief. The pain at its worst is 10/10. She describes belching and bloating in association. She was diagnoses  with gallstones as an outpatient several weeks ago. She denies  nausea/diarrhea/vomiting/constipation. She has not had fevers or jaundice. Prior to last night, the episodes were always self resolving. This time, the pain did not receed until she came to the ED and got pain medication.   Workup showed acute calculous cholecystitis.  Patient was admitted and underwent procedure listed above.  Tolerated procedure well and was transferred to the floor.  Diet was advanced as tolerated.  On POD #1, the patient was voiding well, tolerating diet, ambulating well, pain well controlled, vital signs stable, incisions c/d/i and felt stable for discharge home.  Patient will follow up in our office in 3 weeks and knows to call with questions or concerns.  Physical Exam: General:  Alert, NAD, pleasant, comfortable Abd:  Soft, ND, mild tenderness, incisions C/D/I    Medication List    TAKE these medications        docusate sodium 100 MG capsule  Commonly known as:  COLACE  Take 1 capsule (100 mg total) by mouth 2 (two) times daily as needed for mild constipation.     ibuprofen 200 MG tablet  Commonly known as:  ADVIL,MOTRIN  Take 800 mg by mouth 2 (two) times daily as needed for moderate pain.     NEXIUM PO  Take 1 tablet by mouth daily. Dose: 22.3mg      oxyCODONE-acetaminophen 5-325 MG per tablet  Commonly known as:  PERCOCET/ROXICET  Take 1-2 tablets by mouth every 6 (six) hours as needed for moderate pain.     zolpidem 10 MG tablet  Commonly known as:  AMBIEN  Take 10 mg by mouth at bedtime as needed for sleep.         Follow-up Information    Follow up with [facility]. Schedule an appointment as soon as possible for a visit on 03/14/2015.   Why:  For post-operation check. Your appointment is at 3:45pm, please arrive at least 30 min before your appointment to complete your check in paperwork.  If you are unable to arrive 30 min prior to your appointment time we may have to cancel or reschedule you   Contact information:   [address] [id number] [phone]      Signed: Nat Christen, [facility] [phone]  02/23/2015, 8:07 AM

## 2015-02-23 NOTE — Discharge Summary (Signed)
[facility] Discharge Summary   Patient ID: TAHJAE DURR MRN: [id number] DOB/AGE: Nov 10, 1960 54 y.o.  Admit date: 02/22/2015 Discharge date: 02/23/2015  Admitting Diagnosis: Acute calculous cholecystitis  Discharge Diagnosis Patient Active Problem List   Diagnosis Date Noted  . Acute calculous cholecystitis 02/22/2015  . Knee pain 05/04/2012    Consultants None  Imaging:  Imaging Results (Last 48 hours)    Dg Cholangiogram Operative  02/22/2015 CLINICAL DATA: Cholelithiasis. EXAM: INTRAOPERATIVE CHOLANGIOGRAM TECHNIQUE: Cholangiographic images from the C-arm fluoroscopic device were submitted for interpretation post-operatively. Please see the procedural report for the amount of contrast and the fluoroscopy time utilized. COMPARISON: Ultrasound of same day. FLUOROSCOPY TIME: 9 seconds. FINDINGS: Single cine sequence and single fluoroscopic image were obtained. Contrast was injected through cannulated cystic duct remnant. Mild dilatation common bile duct is noted, but no significant intrahepatic biliary dilatation is noted. No filling defect is seen to suggest residual stone. Antegrade flow into duodenum is noted. IMPRESSION: No evidence of common bile duct stone. Electronically Signed By: Marijo Conception, M.D. On: 02/22/2015 12:18   US Abdomen Complete  02/22/2015 CLINICAL DATA: Abdominal pain, onset 2 weeks ago, worse tonight after eating. EXAM: ULTRASOUND ABDOMEN COMPLETE COMPARISON: 02/06/2015 FINDINGS: Gallbladder: Small stones and sludge in the dependent portion the gallbladder, similar to prior study. No gallbladder wall thickening. Murphy's sign is positive. Common bile duct: Diameter: 7 mm, upper limits of normal Liver: No focal lesion identified. Within normal limits in parenchymal echogenicity. IVC: No abnormality visualized. Pancreas: Pancreatic duct measures 2 mm diameter, upper limits of normal. Pancreas otherwise  unremarkable. Spleen: Size and appearance within normal limits. Right Kidney: Length: 9.8 cm. Echogenicity within normal limits. No mass or hydronephrosis visualized. Left Kidney: Length: 10.3 cm. Echogenicity within normal limits. No mass or hydronephrosis visualized. Abdominal aorta: No aneurysm visualized. Other findings: None. IMPRESSION: Cholelithiasis and sludge in the gallbladder. Positive Murphy's sign. These findings are compatible with acute cholecystitis in the appropriate clinical setting. Electronically Signed By: Lucienne Capers M.D. On: 02/22/2015 02:30   Dg Abd Acute W/chest  02/22/2015 CLINICAL DATA: Centralized epigastric pain since 7 p.m. tonight after eating. History reflux. EXAM: DG ABDOMEN ACUTE W/ 1V CHEST COMPARISON: Chest 02/20/2015 FINDINGS: Normal heart size and pulmonary vascularity. No focal airspace disease or consolidation in the lungs. No blunting of costophrenic angles. No pneumothorax. Mediastinal contours appear intact. Scattered gas and stool in the colon. No small or large bowel distention. No free intra-abdominal air. No abnormal air-fluid levels. No radiopaque stones. Visualized bones appear intact. IMPRESSION: Negative abdominal radiographs. No acute cardiopulmonary disease. Electronically Signed By: Lucienne Capers M.D. On: 02/22/2015 02:37     Procedures Dr. Ninfa Linden (02/22/15) - Laparoscopic Cholecystectomy with NEGATIVE [facility]   Hospital Course:  54 yo F who presents with worsening abdominal pain that started around 4-6 weeks ago. It is very sharp and severe and radiates to the back. It started before Ramadan after she would eat, but it continued even when she was fasting. She was placed on a PPI. This did not really help it. It started as episodic. She has also tried NSAIDs without relief. The pain at its worst is 10/10. She describes belching and bloating in association. She was diagnoses with gallstones as an outpatient  several weeks ago. She denies nausea/diarrhea/vomiting/constipation. She has not had fevers or jaundice. Prior to last night, the episodes were always self resolving. This time, the pain did not receed until she came to the ED and got pain medication.   Workup  showed acute calculous cholecystitis. Patient was admitted and underwent procedure listed above. Tolerated procedure well and was transferred to the floor. Diet was advanced as tolerated. On POD #1, the patient was voiding well, tolerating diet, ambulating well, pain well controlled, vital signs stable, incisions c/d/i and felt stable for discharge home. Patient will follow up in our office in 3 weeks and knows to call with questions or concerns.  Physical Exam: General: Alert, NAD, pleasant, comfortable Abd: Soft, ND, mild tenderness, incisions C/D/I    Medication List    TAKE these medications       docusate sodium 100 MG capsule  Commonly known as: COLACE  Take 1 capsule (100 mg total) by mouth 2 (two) times daily as needed for mild constipation.     ibuprofen 200 MG tablet  Commonly known as: ADVIL,MOTRIN  Take 800 mg by mouth 2 (two) times daily as needed for moderate pain.     NEXIUM PO  Take 1 tablet by mouth daily. Dose: 22.3mg      oxyCODONE-acetaminophen 5-325 MG per tablet  Commonly known as: PERCOCET/ROXICET  Take 1-2 tablets by mouth every 6 (six) hours as needed for moderate pain.     zolpidem 10 MG tablet  Commonly known as: AMBIEN  Take 10 mg by mouth at bedtime as needed for sleep.         Follow-up Information    Follow up with [facility]. Schedule an appointment as soon as possible for a visit on 03/14/2015.   Why: For post-operation check. Your appointment is at 3:45pm, please arrive at least 30 min before your appointment to complete your check in paperwork. If you are unable to arrive 30 min prior to your appointment time we may have to cancel  or reschedule you   Contact information:   [address] [address] [phone]      Signed: Nat Christen, [facility] [phone]  02/23/2015, 8:07 AM

## 2015-06-15 ENCOUNTER — Other Ambulatory Visit: Payer: Self-pay

## 2015-06-21 ENCOUNTER — Other Ambulatory Visit: Payer: Self-pay | Admitting: Obstetrics & Gynecology

## 2015-08-18 DIAGNOSIS — J069 Acute upper respiratory infection, unspecified: Secondary | ICD-10-CM | POA: Diagnosis not present

## 2015-08-18 MED FILL — HYDROCODONE-HOMATROPINE SYR: 5-1.5 | 6 days supply | Qty: 120 | Fill #0

## 2015-08-18 MED FILL — BENZONATATE 100 MG CAPSULE: 100 | 10 days supply | Qty: 60 | Fill #0

## 2015-08-22 ENCOUNTER — Other Ambulatory Visit: Payer: Self-pay

## 2015-08-22 DIAGNOSIS — Z1231 Encounter for screening mammogram for malignant neoplasm of breast: Secondary | ICD-10-CM

## 2015-09-07 MED FILL — ZOLPIDEM TARTRATE 10 MG TAB: 10 | 15 days supply | Qty: 15 | Fill #3

## 2015-09-18 ENCOUNTER — Ambulatory Visit
Admission: RE | Admit: 2015-09-18 | Discharge: 2015-09-18 | Disposition: A | Discharge status: Home / Self Care | Payer: 59 | Source: Ambulatory Visit

## 2015-09-18 DIAGNOSIS — Z1231 Encounter for screening mammogram for malignant neoplasm of breast: Secondary | ICD-10-CM

## 2015-10-04 MED FILL — ZOLPIDEM TARTRATE 10 MG TAB: 10 | 15 days supply | Qty: 15 | Fill #0

## 2015-11-08 MED FILL — ZOLPIDEM TARTRATE 10 MG TAB: 10 | 15 days supply | Qty: 15 | Fill #1

## 2015-11-21 DIAGNOSIS — J309 Allergic rhinitis, unspecified: Secondary | ICD-10-CM | POA: Diagnosis not present

## 2015-11-21 DIAGNOSIS — L918 Other hypertrophic disorders of the skin: Secondary | ICD-10-CM | POA: Diagnosis not present

## 2015-11-30 MED FILL — ZOLPIDEM TARTRATE 10 MG TAB: 10 | 15 days supply | Qty: 15 | Fill #2

## 2015-12-29 MED FILL — ZOLPIDEM TARTRATE 10 MG TAB: 10 | 15 days supply | Qty: 15 | Fill #3

## 2016-01-19 MED FILL — ESTRADIOL-NORETH 1.0-0.5 MG: 1-0.5 | 28 days supply | Qty: 28 | Fill #1

## 2016-01-24 MED FILL — ZOLPIDEM TARTRATE 10 MG TAB: 10 | 15 days supply | Qty: 15 | Fill #0

## 2016-02-08 MED FILL — diazePAM 5 MG TABS: 5 | 5 days supply | Qty: 10 | Fill #0

## 2016-02-28 MED FILL — ZOLPIDEM TARTRATE 10 MG TAB: 10 | 15 days supply | Qty: 15 | Fill #1

## 2016-03-08 MED FILL — ESTRADIOL-NORETH 1.0-0.5 MG: 1-0.5 | 28 days supply | Qty: 28 | Fill #2

## 2016-04-10 MED FILL — ZOLPIDEM TARTRATE 10 MG TAB: 10 | 15 days supply | Qty: 15 | Fill #2

## 2016-04-29 MED FILL — ZOLPIDEM TARTRATE 10 MG TAB: 10 | 15 days supply | Qty: 15 | Fill #0

## 2016-05-02 MED FILL — ESTRADIOL-NORETH 1.0-0.5 MG: 1-0.5 | 28 days supply | Qty: 28 | Fill #3

## 2016-06-24 MED FILL — ZOLPIDEM TARTRATE 10 MG TAB: 10 | 15 days supply | Qty: 15 | Fill #0

## 2016-07-11 DIAGNOSIS — Z6835 Body mass index (BMI) 35.0-35.9, adult: Secondary | ICD-10-CM | POA: Diagnosis not present

## 2016-07-11 DIAGNOSIS — Z01419 Encounter for gynecological examination (general) (routine) without abnormal findings: Secondary | ICD-10-CM | POA: Diagnosis not present

## 2016-07-11 MED FILL — ZOLPIDEM TARTRATE 10 MG TAB: 10 | 30 days supply | Qty: 30 | Fill #0

## 2016-07-23 DIAGNOSIS — E669 Obesity, unspecified: Secondary | ICD-10-CM | POA: Diagnosis not present

## 2016-07-23 DIAGNOSIS — Z0000 Encounter for general adult medical examination without abnormal findings: Secondary | ICD-10-CM | POA: Diagnosis not present

## 2016-08-09 MED FILL — ZOLPIDEM TARTRATE 10 MG TAB: 10 | 30 days supply | Qty: 30 | Fill #1

## 2016-08-12 MED FILL — ESTRADIOL-NORETH 1.0-0.5 MG: 1-0.5 | 28 days supply | Qty: 28 | Fill #0

## 2016-08-23 ENCOUNTER — Other Ambulatory Visit: Payer: Self-pay | Admitting: Obstetrics & Gynecology

## 2016-08-23 DIAGNOSIS — Z1231 Encounter for screening mammogram for malignant neoplasm of breast: Secondary | ICD-10-CM

## 2016-09-18 ENCOUNTER — Ambulatory Visit
Admission: RE | Admit: 2016-09-18 | Discharge: 2016-09-18 | Disposition: A | Discharge status: Home / Self Care | Payer: 59 | Source: Ambulatory Visit | Attending: Obstetrics & Gynecology | Admitting: Obstetrics & Gynecology

## 2016-09-18 DIAGNOSIS — Z1231 Encounter for screening mammogram for malignant neoplasm of breast: Secondary | ICD-10-CM | POA: Diagnosis not present

## 2016-09-19 MED FILL — ZOLPIDEM TARTRATE 10 MG TAB: 10 | 30 days supply | Qty: 30 | Fill #2

## 2016-09-19 MED FILL — ESTRADIOL-NORETH 1.0-0.5 MG: 1-0.5 | 28 days supply | Qty: 28 | Fill #1

## 2016-10-22 DIAGNOSIS — R0981 Nasal congestion: Secondary | ICD-10-CM | POA: Diagnosis not present

## 2016-10-22 MED FILL — ZOLPIDEM TARTRATE 10 MG TAB: 10 | 30 days supply | Qty: 30 | Fill #3

## 2016-10-22 MED FILL — ESTRADIOL-NORETH 1.0-0.5 MG: 1-0.5 | 28 days supply | Qty: 28 | Fill #2

## 2016-10-22 MED FILL — FLUTICASONE PROP 50 MCG SPR: 50 | 60 days supply | Qty: 16 | Fill #0

## 2016-10-29 ENCOUNTER — Emergency Department (HOSPITAL_COMMUNITY)
Admission: EM | Admit: 2016-10-29 | Discharge: 2016-10-29 | Disposition: A | Discharge status: Home / Self Care | Payer: 59 | Attending: Emergency Medicine | Admitting: Emergency Medicine

## 2016-10-29 ENCOUNTER — Emergency Department (HOSPITAL_COMMUNITY): Payer: 59

## 2016-10-29 ENCOUNTER — Ambulatory Visit (HOSPITAL_COMMUNITY): Admission: EM | Admit: 2016-10-29 | Discharge: 2016-10-29 | Discharge status: Against Medical Advice | Payer: 59

## 2016-10-29 ENCOUNTER — Encounter (HOSPITAL_COMMUNITY): Payer: Self-pay

## 2016-10-29 DIAGNOSIS — R079 Chest pain, unspecified: Secondary | ICD-10-CM | POA: Diagnosis not present

## 2016-10-29 DIAGNOSIS — Z5321 Procedure and treatment not carried out due to patient leaving prior to being seen by health care provider: Secondary | ICD-10-CM | POA: Insufficient documentation

## 2016-10-29 LAB — CBC
HCT: 39.7 % (ref 36.0–46.0)
HEMOGLOBIN: 12.6 g/dL (ref 12.0–15.0)
MCH: 27 pg (ref 26.0–34.0)
MCHC: 31.7 g/dL (ref 30.0–36.0)
MCV: 85.2 fL (ref 78.0–100.0)
Platelets: 305 10*3/uL (ref 150–400)
RBC: 4.66 MIL/uL (ref 3.87–5.11)
RDW: 12.9 % (ref 11.5–15.5)
WBC: 4.8 10*3/uL (ref 4.0–10.5)

## 2016-10-29 LAB — BASIC METABOLIC PANEL
Anion gap: 9 (ref 5–15)
BUN: 8 mg/dL (ref 6–20)
CO2: 23 mmol/L (ref 22–32)
Calcium: 9.1 mg/dL (ref 8.9–10.3)
Chloride: 106 mmol/L (ref 101–111)
Creatinine, Ser: 0.71 mg/dL (ref 0.44–1.00)
GFR calc Af Amer: >60 mL/min (ref >60)
GFR calc non Af Amer: >60 mL/min (ref >60)
GLUCOSE: 99 mg/dL (ref 65–99)
Potassium: 3.8 mmol/L (ref 3.5–5.1)
Sodium: 138 mmol/L (ref 135–145)

## 2016-10-29 LAB — I-STAT TROPONIN, ED: Troponin i, poc: 0 ng/mL (ref 0.00–0.08)

## 2016-10-29 NOTE — ED Triage Notes (Signed)
Onset 1 week  Left sided chest pain that radiates to left lateral side of chest.  Pt had dizziness last week, seen by PCP, was given nasal spray.  Pt did not advise PCP that she was having chest pain.  No other symptoms at this time.  Last night chest pain worsened, interfered with sleep.

## 2016-10-29 NOTE — ED Notes (Signed)
Aware she is waiting on treatment room no further complaints.

## 2016-11-16 DIAGNOSIS — L309 Dermatitis, unspecified: Secondary | ICD-10-CM | POA: Diagnosis not present

## 2016-11-22 MED FILL — ZOLPIDEM TARTRATE 10 MG TAB: 10 | 30 days supply | Qty: 30 | Fill #4

## 2016-12-26 MED FILL — ZOLPIDEM TARTRATE 10 MG TAB: 10 | 30 days supply | Qty: 30 | Fill #5

## 2017-01-28 MED FILL — diazePAM 5 MG TABS: 5 | 5 days supply | Qty: 10 | Fill #0

## 2017-05-21 DIAGNOSIS — H5203 Hypermetropia, bilateral: Secondary | ICD-10-CM | POA: Diagnosis not present

## 2017-05-21 DIAGNOSIS — H52222 Regular astigmatism, left eye: Secondary | ICD-10-CM | POA: Diagnosis not present

## 2017-05-21 DIAGNOSIS — H524 Presbyopia: Secondary | ICD-10-CM | POA: Diagnosis not present

## 2017-08-04 DIAGNOSIS — Z0000 Encounter for general adult medical examination without abnormal findings: Secondary | ICD-10-CM | POA: Diagnosis not present

## 2017-08-04 DIAGNOSIS — E669 Obesity, unspecified: Secondary | ICD-10-CM | POA: Diagnosis not present

## 2017-08-04 DIAGNOSIS — G479 Sleep disorder, unspecified: Secondary | ICD-10-CM | POA: Diagnosis not present

## 2017-08-04 DIAGNOSIS — R0981 Nasal congestion: Secondary | ICD-10-CM | POA: Diagnosis not present

## 2017-08-14 ENCOUNTER — Other Ambulatory Visit: Payer: Self-pay | Admitting: Obstetrics & Gynecology

## 2017-08-14 DIAGNOSIS — Z139 Encounter for screening, unspecified: Secondary | ICD-10-CM

## 2017-09-18 DIAGNOSIS — Z01419 Encounter for gynecological examination (general) (routine) without abnormal findings: Secondary | ICD-10-CM | POA: Diagnosis not present

## 2017-09-18 DIAGNOSIS — Z124 Encounter for screening for malignant neoplasm of cervix: Secondary | ICD-10-CM | POA: Diagnosis not present

## 2017-09-22 ENCOUNTER — Ambulatory Visit
Admission: RE | Admit: 2017-09-22 | Discharge: 2017-09-22 | Disposition: A | Discharge status: Home / Self Care | Payer: 59 | Source: Ambulatory Visit | Attending: Obstetrics & Gynecology | Admitting: Obstetrics & Gynecology

## 2017-09-22 DIAGNOSIS — Z139 Encounter for screening, unspecified: Secondary | ICD-10-CM

## 2017-09-22 DIAGNOSIS — Z1231 Encounter for screening mammogram for malignant neoplasm of breast: Secondary | ICD-10-CM | POA: Diagnosis not present

## 2017-10-21 MED FILL — CYCLOBENZAPRINE 10 MG TAB: 10 | 20 days supply | Qty: 20 | Fill #0

## 2018-02-19 DIAGNOSIS — M71572 Other bursitis, not elsewhere classified, left ankle and foot: Secondary | ICD-10-CM | POA: Diagnosis not present

## 2018-02-19 DIAGNOSIS — M76821 Posterior tibial tendinitis, right leg: Secondary | ICD-10-CM | POA: Diagnosis not present

## 2018-02-19 DIAGNOSIS — M7731 Calcaneal spur, right foot: Secondary | ICD-10-CM | POA: Diagnosis not present

## 2018-02-19 DIAGNOSIS — M71571 Other bursitis, not elsewhere classified, right ankle and foot: Secondary | ICD-10-CM | POA: Diagnosis not present

## 2018-02-19 DIAGNOSIS — M7732 Calcaneal spur, left foot: Secondary | ICD-10-CM | POA: Diagnosis not present

## 2018-02-19 DIAGNOSIS — M722 Plantar fascial fibromatosis: Secondary | ICD-10-CM | POA: Diagnosis not present

## 2018-02-19 DIAGNOSIS — M76822 Posterior tibial tendinitis, left leg: Secondary | ICD-10-CM | POA: Diagnosis not present

## 2018-03-03 DIAGNOSIS — M722 Plantar fascial fibromatosis: Secondary | ICD-10-CM | POA: Diagnosis not present

## 2018-04-16 MED FILL — diazePAM 5 MG TABS: 5 | 15 days supply | Qty: 30 | Fill #0

## 2018-08-12 DIAGNOSIS — C50919 Malignant neoplasm of unspecified site of unspecified female breast: Secondary | ICD-10-CM

## 2018-08-12 DIAGNOSIS — Z9221 Personal history of antineoplastic chemotherapy: Secondary | ICD-10-CM

## 2018-08-12 DIAGNOSIS — Z923 Personal history of irradiation: Secondary | ICD-10-CM

## 2018-08-12 HISTORY — DX: Personal history of irradiation: Z92.3

## 2018-08-12 HISTORY — DX: Personal history of antineoplastic chemotherapy: Z92.21

## 2018-08-12 HISTORY — DX: Malignant neoplasm of unspecified site of unspecified female breast: C50.919

## 2018-08-13 ENCOUNTER — Other Ambulatory Visit: Payer: Self-pay | Admitting: Obstetrics & Gynecology

## 2018-08-13 DIAGNOSIS — Z1231 Encounter for screening mammogram for malignant neoplasm of breast: Secondary | ICD-10-CM

## 2018-09-21 ENCOUNTER — Ambulatory Visit (HOSPITAL_COMMUNITY)
Admission: EM | Admit: 2018-09-21 | Discharge: 2018-09-21 | Disposition: A | Discharge status: Home / Self Care | Payer: 59 | Attending: Family Medicine | Admitting: Family Medicine

## 2018-09-21 ENCOUNTER — Encounter (HOSPITAL_COMMUNITY): Payer: Self-pay | Admitting: Emergency Medicine

## 2018-09-21 DIAGNOSIS — J320 Chronic maxillary sinusitis: Secondary | ICD-10-CM | POA: Diagnosis not present

## 2018-09-21 MED ORDER — AMOXICILLIN 500 MG PO CAPS: 500.0000 mg | ORAL_CAPSULE | Freq: Three times a day (TID) | ORAL | 0 refills | Status: DC

## 2018-09-21 MED ORDER — HYDROCODONE-HOMATROPINE 5-1.5 MG/5ML PO SYRP: 5.0000 mL | ORAL_SOLUTION | Freq: Four times a day (QID) | ORAL | 0 refills | Status: DC | PRN

## 2018-09-21 MED FILL — HYDROCODONE-HOMATROPINE SOL: 5-1.5 | 12 days supply | Qty: 120 | Fill #0

## 2018-09-21 MED FILL — AMOXICILLIN 500 MG CAPSULE: 500 | 10 days supply | Qty: 30 | Fill #0

## 2018-09-21 NOTE — ED Provider Notes (Signed)
[facility]    CSN: [id number] Arrival date & time: 09/21/18  5830     History   Chief Complaint Chief Complaint  Patient presents with  . Cough    HPI Natasha Huffman is a 58 y.o. female.   3-day history of productive cough.  Sputum is yellow.  She feels it is drainage from her head.  She also endorses some discomfort in her maxillary and frontal sinuses.  HPI  Past Medical History:  Diagnosis Date  . Gallstones   . GERD (gastroesophageal reflux disease)     Patient Active Problem List   Diagnosis Date Noted  . Acute calculous cholecystitis 02/22/2015  . Knee pain 05/04/2012    Past Surgical History:  Procedure Laterality Date  . [place]; 1995  . CHOLECYSTECTOMY N/A 02/22/2015   Procedure: LAPAROSCOPIC CHOLECYSTECTOMY WITH INTRAOPERATIVE CHOLANGIOGRAM;  Surgeon: [name], MD;  Location: [facility];  Service: General;  Laterality: N/A;  . LAPAROSCOPIC CHOLECYSTECTOMY  02/22/2015  . TUBAL LIGATION  1995    OB History   No obstetric history on file.      Home Medications    Prior to Admission medications   Medication Sig Start Date End Date Taking? Authorizing Provider  docusate sodium (COLACE) 100 MG capsule Take 1 capsule (100 mg total) by mouth 2 (two) times daily as needed for mild constipation. 02/23/15   Nat Christen, PA-C  Esomeprazole Magnesium (NEXIUM PO) Take 1 tablet by mouth daily. Dose: 22.3mg     [provider]  ibuprofen (ADVIL,MOTRIN) 200 MG tablet Take 800 mg by mouth 2 (two) times daily as needed for moderate pain.     [provider]  oxyCODONE-acetaminophen (PERCOCET/ROXICET) 5-325 MG per tablet Take 1-2 tablets by mouth every 6 (six) hours as needed for moderate pain. Patient not taking: Reported on 09/21/2018 02/23/15   Nat Christen, PA-C  zolpidem (AMBIEN) 10 MG tablet Take 10 mg by mouth at bedtime as needed for sleep.    [provider]    Family History Family History  Problem  Relation Age of Onset  . Hypertension Sister     Social History Social History   Tobacco Use  . Smoking status: Never Smoker  . Smokeless tobacco: Never Used  Substance Use Topics  . Alcohol use: No  . Drug use: No     Allergies   Patient has no known allergies.   Review of Systems Review of Systems  Constitutional: Negative.   HENT: Positive for congestion and sinus pressure.   Respiratory: Positive for cough.   All other systems reviewed and are negative.    Physical Exam Triage Vital Signs ED Triage Vitals  Enc Vitals Group     BP 09/21/18 1012 (!) 148/88     Pulse Rate 09/21/18 1012 85     Resp 09/21/18 1012 18     Temp 09/21/18 1012 98.1 F (36.7 C)     Temp src --      SpO2 09/21/18 1012 100 %     Weight --      Height --      Head Circumference --      Peak Flow --      Pain Score 09/21/18 1013 0     Pain Loc --      Pain Edu? --      Excl. in [place]? --    No data found.  Updated Vital Signs BP (!) 148/88   Pulse 85  Temp 98.1 F (36.7 C)   Resp 18   SpO2 100%   Visual Acuity Right Eye Distance:   Left Eye Distance:   Bilateral Distance:    Right Eye Near:   Left Eye Near:    Bilateral Near:     Physical Exam Constitutional:      Appearance: Normal appearance.  HENT:     Head:     Comments: Sinuses tender to percussion    Mouth/Throat:     Mouth: Mucous membranes are moist.     Pharynx: Oropharynx is clear.  Pulmonary:     Effort: Pulmonary effort is normal.     Breath sounds: Normal breath sounds.  Neurological:     Mental Status: She is alert.      UC Treatments / Results  Labs (all labs ordered are listed, but only abnormal results are displayed) Labs Reviewed - No data to display  EKG None  Radiology No results found.  Procedures Procedures (including critical care time)  Medications Ordered in UC Medications - No data to display  Initial Impression / Assessment and Plan / UC Course  I have reviewed the  triage vital signs and the nursing notes.  Pertinent labs & imaging results that were available during my care of the patient were reviewed by me and considered in my medical decision making (see chart for details).     Sinusitis.  Will treat with amoxicillin and Mucinex Final Clinical Impressions(s) / UC Diagnoses   Final diagnoses:  None   Discharge Instructions   None    ED Prescriptions    None     Controlled Substance Prescriptions [place] Controlled Substance Registry consulted? Yes, I have consulted the [place] Controlled Substances Registry for this patient, and feel the risk/benefit ratio today is favorable for proceeding with this prescription for a controlled substance.   Wardell Honour, MD 09/21/18 1041

## 2018-09-21 NOTE — ED Triage Notes (Signed)
Pt c/o cough since Friday.

## 2018-09-23 ENCOUNTER — Ambulatory Visit
Admission: RE | Admit: 2018-09-23 | Discharge: 2018-09-23 | Disposition: A | Discharge status: Home / Self Care | Payer: 59 | Source: Ambulatory Visit | Attending: Obstetrics & Gynecology | Admitting: Obstetrics & Gynecology

## 2018-09-23 ENCOUNTER — Other Ambulatory Visit: Payer: Self-pay | Admitting: Obstetrics & Gynecology

## 2018-09-23 DIAGNOSIS — N6320 Unspecified lump in the left breast, unspecified quadrant: Secondary | ICD-10-CM

## 2018-09-23 DIAGNOSIS — R928 Other abnormal and inconclusive findings on diagnostic imaging of breast: Secondary | ICD-10-CM

## 2018-09-23 DIAGNOSIS — C50412 Malignant neoplasm of upper-outer quadrant of left female breast: Secondary | ICD-10-CM | POA: Diagnosis not present

## 2018-09-23 DIAGNOSIS — Z1231 Encounter for screening mammogram for malignant neoplasm of breast: Secondary | ICD-10-CM

## 2018-09-23 DIAGNOSIS — C801 Malignant (primary) neoplasm, unspecified: Secondary | ICD-10-CM

## 2018-09-23 DIAGNOSIS — N6321 Unspecified lump in the left breast, upper outer quadrant: Secondary | ICD-10-CM | POA: Diagnosis not present

## 2018-09-23 HISTORY — DX: Malignant (primary) neoplasm, unspecified: C80.1

## 2018-09-25 ENCOUNTER — Telehealth: Payer: Self-pay | Admitting: Oncology

## 2018-09-25 ENCOUNTER — Encounter: Payer: Self-pay | Admitting: *Deleted

## 2018-09-25 NOTE — Telephone Encounter (Signed)
Spoke to patient to confirm morning [facility] appointment for 2/19, packet emailed to patient

## 2018-09-28 ENCOUNTER — Other Ambulatory Visit: Payer: Self-pay | Admitting: *Deleted

## 2018-09-28 DIAGNOSIS — Z170 Estrogen receptor positive status [ER+]: Principal | ICD-10-CM | POA: Insufficient documentation

## 2018-09-28 DIAGNOSIS — C50412 Malignant neoplasm of upper-outer quadrant of left female breast: Secondary | ICD-10-CM

## 2018-09-29 ENCOUNTER — Other Ambulatory Visit: Payer: Self-pay | Admitting: Obstetrics & Gynecology

## 2018-09-29 DIAGNOSIS — Z13220 Encounter for screening for lipoid disorders: Secondary | ICD-10-CM | POA: Diagnosis not present

## 2018-09-29 DIAGNOSIS — Z0000 Encounter for general adult medical examination without abnormal findings: Secondary | ICD-10-CM | POA: Diagnosis not present

## 2018-09-29 DIAGNOSIS — M722 Plantar fascial fibromatosis: Secondary | ICD-10-CM | POA: Diagnosis not present

## 2018-09-29 DIAGNOSIS — C50919 Malignant neoplasm of unspecified site of unspecified female breast: Secondary | ICD-10-CM | POA: Diagnosis not present

## 2018-09-29 NOTE — Progress Notes (Signed)
Natasha Huffman  [phone] [phone]    ID: Natasha Huffman DOB: 08-04-1961  MR#: [id number]  IFO#:[id number]  Patient Care Team: [name], MD as PCP - General (Family Medicine) Rockwell Germany, RN as Oncology Nurse Navigator Mauro Kaufmann, RN as Oncology Nurse Navigator [name], MD as Consulting Physician (General Surgery) , [name], MD as Consulting Physician (Oncology) Kyung Rudd, MD as Consulting Physician (Radiation Oncology) OTHER MD:    CHIEF COMPLAINT: Triple positive breast cancer  CURRENT TREATMENT: Neoadjuvant chemotherapy   HISTORY OF CURRENT ILLNESS: Natasha Huffman had routine screening mammography on 09/23/2018 showing a possible abnormality in the left breast. She underwent unilateral left diagnostic mammography with tomography and left breast ultrasonography at [facility] on 09/23/2018 showing: Breast Density Category B. There is a mass with irregular margins in the upper-outer quadrant of the left breast. On physical exam, there is palpable focal thickening in the 2 o'clock location of the left breast. Sonographically, an irregular hypoechoic mass with internal vascularity in the 2 o'clock location of the left breast 10 cm from the nipple is seen. The mass measures 1.8 x 1.1 x 1.0 cm. There is associated posterior acoustic enhancement. Evaluation of the left axilla is negative for adenopathy.  Accordingly on 09/23/2018 she proceeded to biopsy of the left breast area in question. The pathology from this procedure showed ([id number]): invasive ductal carcinoma, grade III. Prognostic indicators significant for: estrogen receptor, 100% positive and progesterone receptor, 2% positive, both with strong staining intensity. Proliferation marker Ki67 at 40%. HER2 positive (3+) by immunohistochemistry.   The patient's subsequent history is as detailed below.   INTERVAL HISTORY: Natasha Huffman was evaluated in the  multidisciplinary breast cancer clinic on 09/30/2018 accompanied by her husband, Natasha Huffman.   Her case was also presented at the multidisciplinary breast cancer conference on the same day. At that time a preliminary plan was proposed: Neoadjuvant chemotherapy, for native surgery, adjuvant radiation.  She did not qualify for genetics testing.  Antiestrogens will follow at the end of local treatment  REVIEW OF SYSTEMS: There were no specific symptoms leading to the original mammogram, which was routinely scheduled. Natasha Huffman is currently on amoxacillin for a sinus infection. She denies unusual headaches, visual changes, nausea, vomiting, stiff neck, dizziness, or gait imbalance. There has been no cough, phlegm production, or pleurisy, no chest pain or pressure, and no change in bowel or bladder habits. The patient denies fever, rash, bleeding, unexplained fatigue or unexplained weight loss. A detailed review of systems was otherwise entirely negative.   PAST MEDICAL HISTORY: Past Medical History:  Diagnosis Date  . Gallstones   . GERD (gastroesophageal reflux disease)      PAST SURGICAL HISTORY: Past Surgical History:  Procedure Laterality Date  . [place]; 1995  . CHOLECYSTECTOMY N/A 02/22/2015   Procedure: LAPAROSCOPIC CHOLECYSTECTOMY WITH INTRAOPERATIVE CHOLANGIOGRAM;  Surgeon: [name], MD;  Location: [place] [facility];  Service: General;  Laterality: N/A;  . LAPAROSCOPIC CHOLECYSTECTOMY  02/22/2015  . TUBAL LIGATION  1995     FAMILY HISTORY: Family History  Problem Relation Age of Onset  . Hypertension Sister   . Colon cancer Maternal Huffman   . Breast cancer Neg Hx    Natasha Huffman died from sepsis at age 70. Patients' mother is 39 years old as of 09/2018. The patient has 3 brothers and 5 sisters. Patient denies anyone in her family having breast, ovarian, prostate, or pancreatic cancer. Natasha Huffman was diagnosed with colon cancer  at age 62.    GYNECOLOGIC HISTORY:   No LMP recorded. Patient is postmenopausal. 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 Her husband, Natasha Huffman, works at a [employer] in [place]. 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 Natasha Huffman has two children, Natasha Huffman and Natasha Huffman. Natasha Huffman is 44, lives in [place], [place], and is a Animator. Natasha Huffman is 46, lives in [place], and works for the [employer]. Natasha Huffman has one grandchild. She attends the [employer] in [place]: Natasha Huffman's husband, Natasha Huffman, is automatically her healthcare power of attorney.     HEALTH MAINTENANCE: Social History   Tobacco Use  . Smoking status: Never Smoker  . Smokeless tobacco: Never Used  Substance Use Topics  . Alcohol use: No  . Drug use: No    Colonoscopy: yes, 2015, [facility]  PAP: 09/2017  Bone density: no   No Known Allergies  Current Outpatient Medications  Medication Sig Dispense Refill  . amoxicillin (AMOXIL) 500 MG capsule Take 1 capsule (500 mg total) by mouth 3 (three) times daily. 30 capsule 0  . HYDROcodone-homatropine (HYCODAN) 5-1.5 MG/5ML syrup Take 5 mLs by mouth every 6 (six) hours as needed for cough. Take 1 to 2 teaspoons at bedtime 120 mL 0   No current facility-administered medications for this visit.      OBJECTIVE: Middle-aged African-American woman who appears well  Vitals:   09/30/18 0915  BP: (!) 152/86  Pulse: 83  Resp: 20  Temp: 99 F (37.2 C)  SpO2: 100%     Body mass index is 37.95 kg/m.   Wt Readings from Last 3 Encounters:  09/30/18 207 lb 8 oz (94.1 kg)  02/22/15 174 lb (78.9 kg)  02/20/15 174 lb (78.9 kg)      ECOG FS:0 - Asymptomatic  Ocular: Sclerae unicteric, pupils round and equal Ear-nose-throat: Oropharynx  clear and moist Lymphatic: No cervical or supraclavicular adenopathy Lungs no rales or rhonchi Heart regular rate and rhythm Abd soft, nontender, positive bowel sounds MSK no focal spinal tenderness, no joint edema Neuro: non-focal, well-oriented, appropriate affect Breasts: The right breast is unremarkable.  The left breast is status post recent biopsy.  There is a moderate ecchymosis.  There are no skin or nipple changes of concern.  Both axillae are benign.   LAB RESULTS:  CMP     Component Value Date/Time   NA 138 10/29/2016 1135   K 3.8 10/29/2016 1135   CL 106 10/29/2016 1135   CO2 23 10/29/2016 1135   GLUCOSE 99 10/29/2016 1135   BUN 8 10/29/2016 1135   CREATININE 0.71 10/29/2016 1135   CALCIUM 9.1 10/29/2016 1135   PROT 7.0 02/22/2015 0117   ALBUMIN 3.9 02/22/2015 0117   AST 194 (H) 02/22/2015 0117   ALT 148 (H) 02/22/2015 0117   ALKPHOS 134 (H) 02/22/2015 0117   BILITOT 0.9 02/22/2015 0117   GFRNONAA >60 10/29/2016 1135   GFRAA >60 10/29/2016 1135    No results found for: TOTALPROTELP, ALBUMINELP, A1GS, A2GS, BETS, BETA2SER, GAMS, MSPIKE, SPEI  No results found for: KPAFRELGTCHN, LAMBDASER, KAPLAMBRATIO  Lab Results  Component Value Date   WBC 4.8 10/29/2016   HGB 12.6 10/29/2016   HCT 39.7 10/29/2016   MCV 85.2  10/29/2016   PLT 305 10/29/2016    '@LASTCHEMISTRY'$ @  No results found for: LABCA2  No components found for: [id number]  No results for input(s): INR in the last 168 hours.  No results found for: LABCA2  No results found for: TGG269  No results found for: SWN462  No results found for: VOJ500  No results found for: CA2729  No components found for: HGQUANT  No results found for: CEA1 / No results found for: CEA1   No results found for: AFPTUMOR  No results found for: CHROMOGRNA  No results found for: PSA1  No visits with results within 3 Day(s) from this visit.  Latest known visit with results is:  Admission on 02/22/2015,  Discharged on 02/23/2015  Component Date Value Ref Range Status  . Lipase 02/22/2015 151* 22 - 51 U/L Final  . Sodium 02/22/2015 139  135 - 145 mmol/L Final  . Potassium 02/22/2015 3.8  3.5 - 5.1 mmol/L Final  . Chloride 02/22/2015 105  101 - 111 mmol/L Final  . CO2 02/22/2015 26  22 - 32 mmol/L Final  . Glucose, Bld 02/22/2015 114* 65 - 99 mg/dL Final  . BUN 02/22/2015 10  6 - 20 mg/dL Final  . Creatinine, Ser 02/22/2015 0.90  0.44 - 1.00 mg/dL Final  . Calcium 02/22/2015 8.9  8.9 - 10.3 mg/dL Final  . Total Protein 02/22/2015 7.0  6.5 - 8.1 g/dL Final  . Albumin 02/22/2015 3.9  3.5 - 5.0 g/dL Final  . AST 02/22/2015 194* 15 - 41 U/L Final  . ALT 02/22/2015 148* 14 - 54 U/L Final  . Alkaline Phosphatase 02/22/2015 134* 38 - 126 U/L Final  . Total Bilirubin 02/22/2015 0.9  0.3 - 1.2 mg/dL Final  . GFR calc non Af Amer 02/22/2015 >60  >60 mL/min Final  . GFR calc Af Amer 02/22/2015 >60  >60 mL/min Final   Comment: (NOTE) The eGFR has been calculated using the CKD EPI equation. This calculation has not been validated in all clinical situations. eGFR's persistently <60 mL/min signify possible Chronic Kidney Disease.   . Anion gap 02/22/2015 8  5 - 15 Final  . WBC 02/22/2015 7.1  4.0 - 10.5 K/uL Final  . RBC 02/22/2015 4.45  3.87 - 5.11 MIL/uL Final  . Hemoglobin 02/22/2015 12.2  12.0 - 15.0 g/dL Final  . HCT 02/22/2015 37.4  36.0 - 46.0 % Final  . MCV 02/22/2015 84.0  78.0 - 100.0 fL Final  . MCH 02/22/2015 27.4  26.0 - 34.0 pg Final  . MCHC 02/22/2015 32.6  30.0 - 36.0 g/dL Final  . RDW 02/22/2015 13.2  11.5 - 15.5 % Final  . Platelets 02/22/2015 273  150 - 400 K/uL Final  . Color, Urine 02/22/2015 YELLOW  YELLOW Final  . APPearance 02/22/2015 CLOUDY* CLEAR Final  . Specific Gravity, Urine 02/22/2015 1.019  1.005 - 1.030 Final  . pH 02/22/2015 7.0  5.0 - 8.0 Final  . Glucose, UA 02/22/2015 NEGATIVE  NEGATIVE mg/dL Final  . Hgb urine dipstick 02/22/2015 NEGATIVE  NEGATIVE Final    . Bilirubin Urine 02/22/2015 NEGATIVE  NEGATIVE Final  . Ketones, ur 02/22/2015 NEGATIVE  NEGATIVE mg/dL Final  . Protein, ur 02/22/2015 NEGATIVE  NEGATIVE mg/dL Final  . Urobilinogen, UA 02/22/2015 1.0  0.0 - 1.0 mg/dL Final  . Nitrite 02/22/2015 NEGATIVE  NEGATIVE Final  . Leukocytes, UA 02/22/2015 NEGATIVE  NEGATIVE Final   MICROSCOPIC NOT DONE ON URINES WITH NEGATIVE PROTEIN, BLOOD, LEUKOCYTES, NITRITE, OR GLUCOSE <  1000 mg/dL.  Marland Kitchen MRSA, PCR 02/22/2015 NEGATIVE  NEGATIVE Final  . Staphylococcus aureus 02/22/2015 NEGATIVE  NEGATIVE Final   Comment:        The Xpert SA Assay (FDA approved for NASAL specimens in patients over 34 years of age), is one component of a comprehensive surveillance program.  Test performance has been validated by [facility] for patients greater than or equal to 38 year old. It is not intended to diagnose infection nor to guide or monitor treatment.     (this displays the last labs from the last 3 days)  No results found for: TOTALPROTELP, ALBUMINELP, A1GS, A2GS, BETS, BETA2SER, GAMS, MSPIKE, SPEI (this displays SPEP labs)  No results found for: KPAFRELGTCHN, LAMBDASER, KAPLAMBRATIO (kappa/lambda light chains)  No results found for: HGBA, HGBA2QUANT, HGBFQUANT, HGBSQUAN (Hemoglobinopathy evaluation)   No results found for: LDH  No results found for: IRON, TIBC, IRONPCTSAT (Iron and TIBC)  No results found for: FERRITIN  Urinalysis    Component Value Date/Time   COLORURINE YELLOW 02/22/2015 0118   APPEARANCEUR CLOUDY (A) 02/22/2015 0118   LABSPEC 1.019 02/22/2015 0118   PHURINE 7.0 02/22/2015 0118   GLUCOSEU NEGATIVE 02/22/2015 0118   HGBUR NEGATIVE 02/22/2015 0118   BILIRUBINUR NEGATIVE 02/22/2015 0118   KETONESUR NEGATIVE 02/22/2015 0118   PROTEINUR NEGATIVE 02/22/2015 0118   UROBILINOGEN 1.0 02/22/2015 0118   NITRITE NEGATIVE 02/22/2015 0118   LEUKOCYTESUR NEGATIVE 02/22/2015 0118     STUDIES:  US Breast Ltd Uni Left Inc  Axilla  Addendum Date: 09/24/2018   ADDENDUM REPORT: 09/24/2018 12:39 ADDENDUM: Correction: Comparison with prior study screening study on 09/23/2018 and multiple earlier exams Electronically Signed   By: Nolon Nations M.D.   On: 09/24/2018 12:39   Result Date: 09/24/2018 CLINICAL DATA:  Patient returns after screening study for evaluation of a possible LEFT breast mass. EXAM: DIGITAL DIAGNOSTIC LEFT MAMMOGRAM WITH CAD AND TOMO ULTRASOUND LEFT  BREAST COMPARISON:  To 00762 and earlier ACR Breast Density Category b: There are scattered areas of fibroglandular density. FINDINGS: Additional 2-D and 3-D images are performed. These views confirm presence of an irregular mass with irregular margins in the UPPER-OUTER QUADRANT of the LEFT breast. Mammographic images were processed with CAD. On physical exam, I palpate focal thickening in the 2 o'clock location of the LEFT breast. Targeted ultrasound is performed, showing an irregular hypoechoic mass with internal vascularity in the 2 o'clock location of the LEFT breast 10 centimeters from the nipple. Mass measures 1.8 x 1.1 x 1.0 centimeters. There is associated posterior acoustic enhancement. Evaluation of the LEFT axilla is negative for adenopathy. IMPRESSION: Suspicious mass in the 2 o'clock location of the LEFT breast. Tissue diagnosis is recommended. RECOMMENDATION: Ultrasound-guided core biopsy of the LEFT breast. This will be performed later today. I have discussed the findings and recommendations with the patient. Results were also provided in writing at the conclusion of the visit. If applicable, a reminder letter will be sent to the patient regarding the next appointment. BI-RADS CATEGORY  5: Highly suggestive of malignancy. Electronically Signed: By: Nolon Nations M.D. On: 09/23/2018 15:00   Mm Diag Breast Tomo Uni Left  Addendum Date: 09/24/2018   ADDENDUM REPORT: 09/24/2018 12:39 ADDENDUM: Correction: Comparison with prior study screening study  on 09/23/2018 and multiple earlier exams Electronically Signed   By: Nolon Nations M.D.   On: 09/24/2018 12:39   Result Date: 09/24/2018 CLINICAL DATA:  Patient returns after screening study for evaluation of a possible LEFT breast mass.  EXAM: DIGITAL DIAGNOSTIC LEFT MAMMOGRAM WITH CAD AND TOMO ULTRASOUND LEFT  BREAST COMPARISON:  To 54982 and earlier ACR Breast Density Category b: There are scattered areas of fibroglandular density. FINDINGS: Additional 2-D and 3-D images are performed. These views confirm presence of an irregular mass with irregular margins in the UPPER-OUTER QUADRANT of the LEFT breast. Mammographic images were processed with CAD. On physical exam, I palpate focal thickening in the 2 o'clock location of the LEFT breast. Targeted ultrasound is performed, showing an irregular hypoechoic mass with internal vascularity in the 2 o'clock location of the LEFT breast 10 centimeters from the nipple. Mass measures 1.8 x 1.1 x 1.0 centimeters. There is associated posterior acoustic enhancement. Evaluation of the LEFT axilla is negative for adenopathy. IMPRESSION: Suspicious mass in the 2 o'clock location of the LEFT breast. Tissue diagnosis is recommended. RECOMMENDATION: Ultrasound-guided core biopsy of the LEFT breast. This will be performed later today. I have discussed the findings and recommendations with the patient. Results were also provided in writing at the conclusion of the visit. If applicable, a reminder letter will be sent to the patient regarding the next appointment. BI-RADS CATEGORY  5: Highly suggestive of malignancy. Electronically Signed: By: Nolon Nations M.D. On: 09/23/2018 15:00   Mm 3d Screen Breast Bilateral  Result Date: 09/23/2018 CLINICAL DATA:  Screening. EXAM: DIGITAL SCREENING BILATERAL MAMMOGRAM WITH TOMO AND CAD COMPARISON:  Previous exam(s). ACR Breast Density Category b: There are scattered areas of fibroglandular density. FINDINGS: In the left breast, a  possible mass warrants further evaluation. In the right breast, no findings suspicious for malignancy. Images were processed with CAD. IMPRESSION: Further evaluation is suggested for possible mass in the left breast. RECOMMENDATION: Diagnostic mammogram and possibly ultrasound of the left breast. (Code:[id number]) The patient will be contacted regarding the findings, and additional imaging will be scheduled. BI-RADS CATEGORY  0: Incomplete. Need additional imaging evaluation and/or prior mammograms for comparison. Electronically Signed   By: Nolon Nations M.D.   On: 09/23/2018 14:33   Mm Clip Placement Left  Result Date: 09/23/2018 CLINICAL DATA:  Status post ultrasound-guided core biopsy of mass in the 2 o'clock location of the LEFT breast. EXAM: DIAGNOSTIC LEFT MAMMOGRAM POST ULTRASOUND BIOPSY COMPARISON:  Previous exam(s). FINDINGS: Mammographic images were obtained following ultrasound guided biopsy of mass in the 2 o'clock location of the LEFT breast. A ribbon shaped clip is identified in the mass in the UPPER-OUTER QUADRANT of the LEFT breast as expected following biopsy. IMPRESSION: Tissue marker clip in the expected location following biopsy. Final Assessment: Post Procedure Mammograms for Marker Placement Electronically Signed   By: Nolon Nations M.D.   On: 09/23/2018 15:36   [place] Lt Breast Bx W Loc Dev 1st Lesion Img Bx Spec US Guide  Addendum Date: 09/24/2018   ADDENDUM REPORT: 09/24/2018 12:43 ADDENDUM: PATHOLOGY ADDENDUM: Pathology LEFT breast 2 o'clock: Invasive ductal carcinoma, likely grade 3. Pathology concordance with imaging findings: Yes Recommendation: Consultation for treatment plan. The patient is scheduled for the O clinic on 09/30/2018 At the request of the patient, I spoke with her by telephone on 09/24/2018 at 9:53 Natasham. She reports doing well after the biopsy . Electronically Signed   By: Nolon Nations M.D.   On: 09/24/2018 12:43   Result Date: 09/24/2018 CLINICAL DATA:  Mass  in the 2 o'clock location of the LEFT breast. EXAM: ULTRASOUND GUIDED LEFT BREAST CORE NEEDLE BIOPSY COMPARISON:  Previous exam(s). FINDINGS: I met with the patient and we discussed the procedure  of ultrasound-guided biopsy, including benefits and alternatives. We discussed the high likelihood of a successful procedure. We discussed the risks of the procedure, including infection, bleeding, tissue injury, clip migration, and inadequate sampling. Informed written consent was given. The usual time-out protocol was performed immediately prior to the procedure. Lesion quadrant: Upper outer quadrant UPPER-OUTER QUADRANT LEFT breast Using sterile technique and 1% Lidocaine as local anesthetic, under direct ultrasound visualization, a 12 gauge spring-loaded device was used to perform biopsy of mass in the 2 o'clock location of the LEFT breast using a this LATERAL to MEDIAL approach. At the conclusion of the procedure a ribbon shaped tissue marker clip was deployed into the biopsy cavity. Follow up 2 view mammogram was performed and dictated separately. IMPRESSION: Ultrasound guided biopsy of LEFT breast mass. No apparent complications. Electronically Signed: By: Nolon Nations M.D. On: 09/23/2018 15:35     ELIGIBLE FOR AVAILABLE RESEARCH PROTOCOL: UPBEAT   ASSESSMENT: 58 y.o. [name], [place] woman status post left breast upper outer quadrant biopsy 09/23/2018 for a clinical T1c N0, stage IA invasive ductal carcinoma, grade 3, triple positive, with an MIB-1 of 40%  (1) neoadjuvant chemotherapy will consist of carboplatin, docetaxel, and trastuzumab to start 10/22/2018, repeated every 21 days x 6  (2) definitive surgery to follow  (3) adjuvant radiation  (4) antiestrogens to start at the completion of local treatment    PLAN: I spent approximately 60 minutes face to face with Ladoris with more than 50% of that time spent in counseling and coordination of care. Specifically we reviewed the biology of  the patient's diagnosis and the specifics of her situation.  We first reviewed the fact that cancer is not one disease but more than 100 different diseases and that it is important to keep them separate-- otherwise when friends and relatives discuss their own cancer experiences with [place] confusion can result. Similarly we explained that if breast cancer spreads to the bone or liver, the patient would not have bone cancer or liver cancer, but breast cancer in the bone and breast cancer in the liver: one cancer in three places-- not 3 different cancers which otherwise would have to be treated in 3 different ways.  We discussed the difference between local and systemic therapy. In terms of loco-regional treatment, lumpectomy plus radiation is equivalent to mastectomy as far as survival is concerned. For this reason, and because the cosmetic results are generally superior, we recommend breast conserving surgery.   We also noted that in terms of sequencing of treatments, whether systemic therapy or surgery is done first does not affect the ultimate outcome.  In her case we are going to start with chemotherapy primarily so that she will be able to participate in the [place] she has scheduled between 03/07/2019 and 03/16/2019 without interrupting treatment.  We then discussed the rationale for systemic therapy. There is some risk that this cancer may have already spread to other parts of her body. Patients frequently ask at this point about bone scans, CAT scans and PET scans to find out if they have occult breast cancer somewhere else. The problem is that in early stage disease we are much more likely to find false positives then true cancers and this would expose the patient to unnecessary procedures as well as unnecessary radiation. Scans cannot answer the question the patient really would like to know, which is whether she has microscopic disease elsewhere in her body. For those reasons we do not recommend  them.  Of course  we would proceed to aggressive evaluation of any symptoms that might suggest metastatic disease, but that is not the case here.  Next we went over the options for systemic therapy which are anti-estrogens, anti-HER-2 immunotherapy, and chemotherapy. WPVXYI meets criteria for all 3.  She understands this will reduce her risk of recurrence from what ever that risk is after optimal surgery and radiation, call it X, to 1/6 of X.  She is being scheduled for port placement, echocardiography, chemotherapy school, and is considering the UPBEAT study.  She will return to see [place] on 10/14/2018 to learn how to take her supportive medications.  The target start date for chemotherapy is 10/22/2018  Asusena has a good understanding of the overall plan. She agrees with it. She knows the goal of treatment in her case is cure. She will call with any problems that may develop before her next visit here.   , [name], MD  09/30/18 6:26 PM Medical Oncology and Hematology [facility] [address] [place], [place] [address] Tel. [phone]    Fax. [phone]    I, Jacqualyn Posey am acting as a Education administrator for [name], MD.   I, Lurline Del MD, have reviewed the above documentation for accuracy and completeness, and I agree with the above.

## 2018-09-30 ENCOUNTER — Other Ambulatory Visit: Payer: Self-pay

## 2018-09-30 ENCOUNTER — Inpatient Hospital Stay (HOSPITAL_BASED_OUTPATIENT_CLINIC_OR_DEPARTMENT_OTHER): Payer: 59 | Admitting: Oncology

## 2018-09-30 ENCOUNTER — Ambulatory Visit: Payer: Self-pay | Admitting: Surgery

## 2018-09-30 ENCOUNTER — Ambulatory Visit: Payer: 59 | Attending: Surgery | Admitting: Physical Therapy

## 2018-09-30 ENCOUNTER — Ambulatory Visit
Admission: RE | Admit: 2018-09-30 | Discharge: 2018-09-30 | Disposition: A | Discharge status: Home / Self Care | Payer: 59 | Source: Ambulatory Visit | Attending: Radiation Oncology | Admitting: Radiation Oncology

## 2018-09-30 ENCOUNTER — Encounter: Payer: Self-pay | Admitting: Physical Therapy

## 2018-09-30 ENCOUNTER — Inpatient Hospital Stay: Payer: 59

## 2018-09-30 ENCOUNTER — Other Ambulatory Visit: Payer: Self-pay | Admitting: *Deleted

## 2018-09-30 ENCOUNTER — Encounter: Payer: Self-pay | Admitting: Oncology

## 2018-09-30 VITALS — BP 152/86 | HR 83 | Temp 99.0°F | Resp 20 | Ht 62.0 in | Wt 207.5 lb

## 2018-09-30 DIAGNOSIS — Z170 Estrogen receptor positive status [ER+]: Principal | ICD-10-CM

## 2018-09-30 DIAGNOSIS — C50412 Malignant neoplasm of upper-outer quadrant of left female breast: Secondary | ICD-10-CM

## 2018-09-30 DIAGNOSIS — C50912 Malignant neoplasm of unspecified site of left female breast: Secondary | ICD-10-CM | POA: Diagnosis not present

## 2018-09-30 DIAGNOSIS — R293 Abnormal posture: Secondary | ICD-10-CM | POA: Diagnosis not present

## 2018-09-30 MED ORDER — DEXAMETHASONE 4 MG PO TABS: 8.0000 mg | ORAL_TABLET | Freq: Two times a day (BID) | ORAL | 1 refills | Status: DC

## 2018-09-30 MED ORDER — PROCHLORPERAZINE MALEATE 10 MG PO TABS: 10.0000 mg | ORAL_TABLET | Freq: Four times a day (QID) | ORAL | 1 refills | Status: DC | PRN

## 2018-09-30 MED ORDER — LORAZEPAM 0.5 MG PO TABS: 0.5000 mg | ORAL_TABLET | Freq: Every evening | ORAL | 0 refills | Status: DC | PRN

## 2018-09-30 MED ORDER — LIDOCAINE-PRILOCAINE 2.5-2.5 % EX CREA: TOPICAL_CREAM | CUTANEOUS | 3 refills | Status: DC

## 2018-09-30 NOTE — Research (Signed)
[facility] [id number] UPBEAT  Met with patient and her husband for approximately 15 minutes to discuss the UPBEAT trial. Clinical trials and UPBEAT brochure given to patient. Provided patient and husband with brief explanation of the study, alternatives, potential risks and benefits. Pt's questions were answered and patient was provided with informed consent, hipaa form and this RN's business card. Pt confirmed it was okay for this RN to call her at the end of this week or the beginning of this week to answer any further questions. Thanked patient and her husband for their time and interest. Johney Maine RN, BSN Clinical Research  09/30/18 11:15 AM

## 2018-09-30 NOTE — Progress Notes (Signed)
Radiation Oncology         [phone] ________________________________  Name: Natasha Huffman        MRN: [id number]  Date of Service: 09/30/2018 DOB: 03/29/1961  CC:Natasha Arabian, MD  [name], MD     REFERRING PHYSICIAN: [name], MD   DIAGNOSIS: The encounter diagnosis was Malignant neoplasm of upper-outer quadrant of left breast in female, estrogen receptor positive ([place]).   HISTORY OF PRESENT ILLNESS: Natasha Huffman is a 58 y.o. female seen in the multidisciplinary breast clinic for a new diagnosis of left breast cancer. The patient was noted to have a screening detected mass in the left breast in the posterior upper outer quadrant. Diagnostic imaging revealed a mass at 2:00 measuring 1.8 x 1.1 x 1 cm and her axilla was negative for adenopathy. A biopsy on 09/23/2018 revealed a grade 3, invasive ductal carcinoma and her tumor was triple positive (PR was 2%) with a Ki 67 of 40%. She comes today to discuss options of treatment for her breast cancer.    PREVIOUS RADIATION THERAPY: No   PAST MEDICAL HISTORY:  Past Medical History:  Diagnosis Date  . Gallstones   . GERD (gastroesophageal reflux disease)        PAST SURGICAL HISTORY: Past Surgical History:  Procedure Laterality Date  . [place]; 1995  . CHOLECYSTECTOMY N/A 02/22/2015   Procedure: LAPAROSCOPIC CHOLECYSTECTOMY WITH INTRAOPERATIVE CHOLANGIOGRAM;  Surgeon: [name], MD;  Location: [facility];  Service: General;  Laterality: N/A;  . LAPAROSCOPIC CHOLECYSTECTOMY  02/22/2015  . TUBAL LIGATION  1995     FAMILY HISTORY:  Family History  Problem Relation Age of Onset  . Hypertension Sister   . Colon cancer Maternal Aunt   . Breast cancer Neg Hx      SOCIAL HISTORY:  reports that she has never smoked. She has never used smokeless tobacco. She reports that she does not drink alcohol or use drugs. The patient is married and lives in [place]. She works for [employer] and  works in Engineer, technical sales. She is planning a pilgrimage to [place] in July 2020.    ALLERGIES: Patient has no known allergies.   MEDICATIONS:  Current Outpatient Medications  Medication Sig Dispense Refill  . amoxicillin (AMOXIL) 500 MG capsule Take 1 capsule (500 mg total) by mouth 3 (three) times daily. 30 capsule 0  . HYDROcodone-homatropine (HYCODAN) 5-1.5 MG/5ML syrup Take 5 mLs by mouth every 6 (six) hours as needed for cough. Take 1 to 2 teaspoons at bedtime 120 mL 0   No current facility-administered medications for this encounter.      REVIEW OF SYSTEMS: On review of systems, the patient reports that she is doing well overall. She denies any chest pain, shortness of breath, cough, fevers, chills, night sweats, unintended weight changes. She denies any bowel or bladder disturbances, and denies abdominal pain, nausea or vomiting. She denies any new musculoskeletal or joint aches or pains. A complete review of systems is obtained and is otherwise negative.     PHYSICAL EXAM:  Wt Readings from Last 3 Encounters:  09/30/18 207 lb 8 oz (94.1 kg)  02/22/15 174 lb (78.9 kg)  02/20/15 174 lb (78.9 kg)   Temp Readings from Last 3 Encounters:  09/30/18 99 F (37.2 C) (Oral)  09/21/18 98.1 F (36.7 C)  02/23/15 97.7 F (36.5 C) (Oral)   BP Readings from Last 3 Encounters:  09/30/18 (!) 152/86  09/21/18 (!) 148/88  02/23/15 114/65  Pulse Readings from Last 3 Encounters:  09/30/18 83  09/21/18 85  02/23/15 66     In general this is a well appearing African American female in no acute distress. She is alert and oriented x4 and appropriate throughout the examination. HEENT reveals that the patient is normocephalic, atraumatic. EOMs are intact. Skin is intact without any evidence of gross lesions. Cardiopulmonary assessment is negative for acute distress and she exhibits normal effort. Breast exam is deferred.    ECOG = 0  0 - Asymptomatic (Fully active, able to carry on all  predisease activities without restriction)  1 - Symptomatic but completely ambulatory (Restricted in physically strenuous activity but ambulatory and able to carry out work of a light or sedentary nature. For example, light housework, office work)  2 - Symptomatic, <50% in bed during the day (Ambulatory and capable of all self care but unable to carry out any work activities. Up and about more than 50% of waking hours)  3 - Symptomatic, >50% in bed, but not bedbound (Capable of only limited self-care, confined to bed or chair 50% or more of waking hours)  4 - Bedbound (Completely disabled. Cannot carry on any self-care. Totally confined to bed or chair)  5 - Death   Eustace Pen MM, Creech RH, Tormey DC, et al. [phone]). "Toxicity and response criteria of the [facility]". [place] Oncol. 5 (6): 649-55    LABORATORY DATA:  Lab Results  Component Value Date   WBC 4.8 10/29/2016   HGB 12.6 10/29/2016   HCT 39.7 10/29/2016   MCV 85.2 10/29/2016   PLT 305 10/29/2016   Lab Results  Component Value Date   NA 138 10/29/2016   K 3.8 10/29/2016   CL 106 10/29/2016   CO2 23 10/29/2016   Lab Results  Component Value Date   ALT 148 (H) 02/22/2015   AST 194 (H) 02/22/2015   ALKPHOS 134 (H) 02/22/2015   BILITOT 0.9 02/22/2015      RADIOGRAPHY: US Breast Ltd Uni Left Inc Axilla  Addendum Date: 09/24/2018   ADDENDUM REPORT: 09/24/2018 12:39 ADDENDUM: Correction: Comparison with prior study screening study on 09/23/2018 and multiple earlier exams Electronically Signed   By: Nolon Nations M.D.   On: 09/24/2018 12:39   Result Date: 09/24/2018 CLINICAL DATA:  Patient returns after screening study for evaluation of a possible LEFT breast mass. EXAM: DIGITAL DIAGNOSTIC LEFT MAMMOGRAM WITH CAD AND TOMO ULTRASOUND LEFT  BREAST COMPARISON:  To 41740 and earlier ACR Breast Density Category b: There are scattered areas of fibroglandular density. FINDINGS: Additional 2-D and 3-D  images are performed. These views confirm presence of an irregular mass with irregular margins in the UPPER-OUTER QUADRANT of the LEFT breast. Mammographic images were processed with CAD. On physical exam, I palpate focal thickening in the 2 o'clock location of the LEFT breast. Targeted ultrasound is performed, showing an irregular hypoechoic mass with internal vascularity in the 2 o'clock location of the LEFT breast 10 centimeters from the nipple. Mass measures 1.8 x 1.1 x 1.0 centimeters. There is associated posterior acoustic enhancement. Evaluation of the LEFT axilla is negative for adenopathy. IMPRESSION: Suspicious mass in the 2 o'clock location of the LEFT breast. Tissue diagnosis is recommended. RECOMMENDATION: Ultrasound-guided core biopsy of the LEFT breast. This will be performed later today. I have discussed the findings and recommendations with the patient. Results were also provided in writing at the conclusion of the visit. If applicable, a reminder letter will be  sent to the patient regarding the next appointment. BI-RADS CATEGORY  5: Highly suggestive of malignancy. Electronically Signed: By: Nolon Nations M.D. On: 09/23/2018 15:00   Mm Diag Breast Tomo Uni Left  Addendum Date: 09/24/2018   ADDENDUM REPORT: 09/24/2018 12:39 ADDENDUM: Correction: Comparison with prior study screening study on 09/23/2018 and multiple earlier exams Electronically Signed   By: Nolon Nations M.D.   On: 09/24/2018 12:39   Result Date: 09/24/2018 CLINICAL DATA:  Patient returns after screening study for evaluation of a possible LEFT breast mass. EXAM: DIGITAL DIAGNOSTIC LEFT MAMMOGRAM WITH CAD AND TOMO ULTRASOUND LEFT  BREAST COMPARISON:  To 16109 and earlier ACR Breast Density Category b: There are scattered areas of fibroglandular density. FINDINGS: Additional 2-D and 3-D images are performed. These views confirm presence of an irregular mass with irregular margins in the UPPER-OUTER QUADRANT of the LEFT  breast. Mammographic images were processed with CAD. On physical exam, I palpate focal thickening in the 2 o'clock location of the LEFT breast. Targeted ultrasound is performed, showing an irregular hypoechoic mass with internal vascularity in the 2 o'clock location of the LEFT breast 10 centimeters from the nipple. Mass measures 1.8 x 1.1 x 1.0 centimeters. There is associated posterior acoustic enhancement. Evaluation of the LEFT axilla is negative for adenopathy. IMPRESSION: Suspicious mass in the 2 o'clock location of the LEFT breast. Tissue diagnosis is recommended. RECOMMENDATION: Ultrasound-guided core biopsy of the LEFT breast. This will be performed later today. I have discussed the findings and recommendations with the patient. Results were also provided in writing at the conclusion of the visit. If applicable, a reminder letter will be sent to the patient regarding the next appointment. BI-RADS CATEGORY  5: Highly suggestive of malignancy. Electronically Signed: By: Nolon Nations M.D. On: 09/23/2018 15:00   Mm 3d Screen Breast Bilateral  Result Date: 09/23/2018 CLINICAL DATA:  Screening. EXAM: DIGITAL SCREENING BILATERAL MAMMOGRAM WITH TOMO AND CAD COMPARISON:  Previous exam(s). ACR Breast Density Category b: There are scattered areas of fibroglandular density. FINDINGS: In the left breast, a possible mass warrants further evaluation. In the right breast, no findings suspicious for malignancy. Images were processed with CAD. IMPRESSION: Further evaluation is suggested for possible mass in the left breast. RECOMMENDATION: Diagnostic mammogram and possibly ultrasound of the left breast. (Code:[id number]) The patient will be contacted regarding the findings, and additional imaging will be scheduled. BI-RADS CATEGORY  0: Incomplete. Need additional imaging evaluation and/or prior mammograms for comparison. Electronically Signed   By: Nolon Nations M.D.   On: 09/23/2018 14:33   Mm Clip Placement  Left  Result Date: 09/23/2018 CLINICAL DATA:  Status post ultrasound-guided core biopsy of mass in the 2 o'clock location of the LEFT breast. EXAM: DIAGNOSTIC LEFT MAMMOGRAM POST ULTRASOUND BIOPSY COMPARISON:  Previous exam(s). FINDINGS: Mammographic images were obtained following ultrasound guided biopsy of mass in the 2 o'clock location of the LEFT breast. A ribbon shaped clip is identified in the mass in the UPPER-OUTER QUADRANT of the LEFT breast as expected following biopsy. IMPRESSION: Tissue marker clip in the expected location following biopsy. Final Assessment: Post Procedure Mammograms for Marker Placement Electronically Signed   By: Nolon Nations M.D.   On: 09/23/2018 15:36   [place] Lt Breast Bx W Loc Dev 1st Lesion Img Bx Spec US Guide  Addendum Date: 09/24/2018   ADDENDUM REPORT: 09/24/2018 12:43 ADDENDUM: PATHOLOGY ADDENDUM: Pathology LEFT breast 2 o'clock: Invasive ductal carcinoma, likely grade 3. Pathology concordance with imaging findings: Yes Recommendation: Consultation  for treatment plan. The patient is scheduled for the O clinic on 09/30/2018 At the request of the patient, I spoke with her by telephone on 09/24/2018 at 9:53 a.m. She reports doing well after the biopsy . Electronically Signed   By: Nolon Nations M.D.   On: 09/24/2018 12:43   Result Date: 09/24/2018 CLINICAL DATA:  Mass in the 2 o'clock location of the LEFT breast. EXAM: ULTRASOUND GUIDED LEFT BREAST CORE NEEDLE BIOPSY COMPARISON:  Previous exam(s). FINDINGS: I met with the patient and we discussed the procedure of ultrasound-guided biopsy, including benefits and alternatives. We discussed the high likelihood of a successful procedure. We discussed the risks of the procedure, including infection, bleeding, tissue injury, clip migration, and inadequate sampling. Informed written consent was given. The usual time-out protocol was performed immediately prior to the procedure. Lesion quadrant: Upper outer quadrant  UPPER-OUTER QUADRANT LEFT breast Using sterile technique and 1% Lidocaine as local anesthetic, under direct ultrasound visualization, a 12 gauge spring-loaded device was used to perform biopsy of mass in the 2 o'clock location of the LEFT breast using a this LATERAL to MEDIAL approach. At the conclusion of the procedure a ribbon shaped tissue marker clip was deployed into the biopsy cavity. Follow up 2 view mammogram was performed and dictated separately. IMPRESSION: Ultrasound guided biopsy of LEFT breast mass. No apparent complications. Electronically Signed: By: Nolon Nations M.D. On: 09/23/2018 15:35       IMPRESSION/PLAN: 1. 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 Dr. Lisbeth Renshaw discusses the pathology findings and reviews the nature of triple positive invasive breast disease. The consensus from the breast conference includes neoadjuvant chemotherapy. She appears to be a candidate for breast conservation with lumpectomy with sentinel node biopsy. She would benefit from external radiotherapy to the breast if she undergoes breast conservation surgery. Her course would also include targeted HER2 treatment followed by antiestrogen therapy. We discussed the risks, benefits, short, and long term effects of radiotherapy, and the patient is interested in proceeding with radiotherapy at the appropriate interval. Dr. Lisbeth Renshaw discusses the delivery and logistics of radiotherapy and anticipates a course of 6 1/2 weeks of radiotherapy with deep inspiration breath hold technique. We will see her back following completion of surgery.   The above documentation reflects my direct findings during this shared patient visit. Please see the separate note by Dr. Lisbeth Renshaw on this date for the remainder of the patient's plan of care.    Carola Rhine, PAC

## 2018-09-30 NOTE — H&P (View-Only) (Signed)
Ronie Spies Stefano Documented: 09/30/2018 7:21 AM Location: [facility] Patient #: [id number] DOB: [birth year] Undefined / Language: Suszanne Conners / Race: Black or African American Female  History of Present Illness Marcello Moores A. Destini Cambre MD; 09/30/2018 11:43 AM) Patient words: the patient presents at the request of dr brown for SDM abnormality  1.8 cm mass noted left breast uoq core bx IDC HER 2 NEU POS ER POS PR POS   no hx of mass nipple discharge or pain                             ADDITIONAL INFORMATION: PROGNOSTIC INDICATORS Results: IMMUNOHISTOCHEMICAL AND MORPHOMETRIC ANALYSIS PERFORMED MANUALLY The tumor cells are POSITIVE for Her2 (3+). Of note, the tumor cells are heterogenous in regards to Her2 expression. Estrogen Receptor: 100%, POSITIVE, STRONG STAINING INTENSITY Progesterone Receptor: 2%, POSITIVE, STRONG STAINING INTENSITY Proliferation Marker Ki67: 40% REFERENCE RANGE ESTROGEN RECEPTOR NEGATIVE 0% POSITIVE =>1% REFERENCE RANGE PROGESTERONE RECEPTOR NEGATIVE 0% POSITIVE =>1% All controls stained appropriately Enid Cutter MD Pathologist, Electronic Signature ( Signed 09/25/2018) FINAL DIAGNOSIS Diagnosis Breast, left, needle core biopsy, 2 o'clock; ribbon clip - INVASIVE DUCTAL CARCINOMA. - SEE COMMENT. 1 of 2 FINAL for RORIE, DELMORE Z ([id number]) Microscopic Comment The carcinoma appears grade III. A breast prognostic profile will be performed and the results reported separately. The results were called to the [facility] on 09/24/2018. (JBK:kh 09/24/2018) Enid Cutter MD Pathologist, Electronic Signature (Case signed 09/24/2018) Specimen Gross and Clinical Information Specimen Comment In formalin 3:20, extracted < 5 minutes; new mass left breast on screening Specimen(s) Obtained: Breast, left, needle core biopsy, 2 o'clock; ribbon clip Specimen Clinical Information Suspicious mass; suspect IMC, possibly high  grade Gross Received in formalin (TIF = 3:20pm, CIT = less than five minutes) labeled with the patient's name and "Lt breast 2:00" are three cores of tan yellow soft tissue ranging from 1.1 to 1.5 cm, entirely submitted in one cassette. (AK 09/23/2018) Stain(s) used in Diagnosis: The following stain(s) were used in diagnosing the case: ER-ACIS, PR-ACIS, Her2 by IHC, KI-67-ACIS. The control(s) stained appropriately. Disclaimer Estrogen receptor (6F11), immunohistochemical stains are performed on formalin fixed, paraffin embedded tissue using a 3,3"-diaminobenzidine (DAB) chromogen and Leica Bond Autostainer System. The staining intensity of the nucleus is scored manually and is reported as the percentage of tumor cell nuclei demonstrating specific nuclear staining.Specimens are fixed in 10% Neutral Buffered Formalin for at least 6 hours and up to 72 hours. These tests have not be validated on decalcified tissue. Results should be interpreted with caution given the possibility of false negative results on decalcified specimens. IHC scores are reported using ASCO/CAP scoring criteria. An IHC Score of 0 or 1+ is NEGATIVE for HER2, 3+ is POSITIVE for HER2, and 2+ is EQUIVOCAL. Equivocal results are reflexed to either FISH or IHC testing. Specimens are fixed in 10% Neutral Buffered Formalin for at least 6 hours and up to 72 hours. These tests have not be validated on decalcified tissue. Results should be interpreted with caution given the possibility of false negative results on decalcified specimens. Ki-67 (MM1), immunohistochemical stains are performed on formalin fixed, paraffin embedded tissue using a 3,3"-diaminobenzidine (DAB) chromogen and Leica Bond Autostainer System. The staining intensity of the nucleus is scored manually and is reported as the percentage of tumor cell nuclei demonstrating specific nuclear staining.Specimens are fixed in 10% Neutral Buffered Formalin for at least 6 hours and up  to 72 hours. These  tests have not be validated on decalcified tissue. Results should be interpreted with caution given the possibility of false negative results on decalcified specimens. PR progesterone receptor (16), immunohistochemical stains are performed on formalin fixed, paraffin embedded tissue using a 3,3"-diaminobenzidine (DAB) chromogen and Leica Bond Autostainer System. The staining intensity of the nucleus is scored manually and is reported as the percentage of tumor cell nuclei demonstrating specific nuclear staining.Specimens are fixed in 10% Neutral Buffered Formalin for at least 6 hours and up to 72 hours. These tests have not be validated on decalcified tissue. Results should be interpreted with caution given the possibility of false negative results on decalcified specimens. Report signed out from the following location(s) Technical Component was performed at [facility]. [address],[place] [address].[id number]., Interpretation was performed at [facility], [place], [place] [address]. CLIA #: [id number], 2 of 2         Patient returns after screening study for evaluation of a possible LEFT breast mass.  EXAM: DIGITAL DIAGNOSTIC LEFT MAMMOGRAM WITH CAD AND TOMO  ULTRASOUND LEFT BREAST  COMPARISON: To 49201 and earlier  ACR Breast Density Category b: There are scattered areas of fibroglandular density.  FINDINGS: Additional 2-D and 3-D images are performed. These views confirm presence of an irregular mass with irregular margins in the UPPER-OUTER QUADRANT of the LEFT breast.  Mammographic images were processed with CAD.  On physical exam, I palpate focal thickening in the 2 o'clock location of the LEFT breast.  Targeted ultrasound is performed, showing an irregular hypoechoic mass with internal vascularity in the 2 o'clock location of the LEFT breast 10 centimeters from the  nipple. Mass measures 1.8 x 1.1 x 1.0 centimeters. There is associated posterior acoustic enhancement.  Evaluation of the LEFT axilla is negative for adenopathy.  IMPRESSION: Suspicious mass in the 2 o'clock location of the LEFT breast. Tissue diagnosis is recommended.  RECOMMENDATION: Ultrasound-guided core biopsy of the LEFT breast. This will be performed later today.  I have discussed the findings and recommendations with the patient. Results were also provided in writing at the conclusion of the visit. If applicable, a reminder letter will be sent to the patient regarding the next appointment.  BI-RADS CATEGORY 5: Highly suggestive of malignancy.  Electronically Signed: By: Nolon Nations M.D. On: 09/23/2018 15:00.  The patient is a 58 year old female.   Medication History Tawni Pummel, RN; 09/30/2018 7:22 AM) Medications Reconciled     Physical Exam (Laelle Bridgett A. Kieren Adkison MD; 09/30/2018 11:39 AM)  General Mental Status-Alert. General Appearance-Consistent with stated age. Hydration-Well hydrated. Voice-Normal.  Chest and Lung Exam Chest and lung exam reveals -quiet, even and easy respiratory effort with no use of accessory muscles and on auscultation, normal breath sounds, no adventitious sounds and normal vocal resonance. Inspection Chest Wall - Normal. Back - normal.  Breast Breast - Left-Symmetric, Non Tender, No Biopsy scars, no Dimpling, No Inflammation, No Lumpectomy scars, No Mastectomy scars, No Peau d' Orange. Breast - Right-Symmetric, Non Tender, No Biopsy scars, no Dimpling, No Inflammation, No Lumpectomy scars, No Mastectomy scars, No Peau d' Orange. Breast Lump-No Palpable Breast Mass. Note: BRUISING LEFT BREAST  Cardiovascular Cardiovascular examination reveals -normal heart sounds, regular rate and rhythm with no murmurs and normal pedal pulses bilaterally.  Neurologic Neurologic evaluation reveals -alert and oriented x  3 with no impairment of recent or remote memory. Mental Status-Normal.  Musculoskeletal Normal Exam - Left-Upper Extremity Strength Normal and Lower Extremity Strength Normal.  Normal Exam - Right-Upper Extremity Strength Normal and Lower Extremity Strength Normal.  Lymphatic Head & Neck  General Head & Neck Lymphatics: Bilateral - Description - Normal. Axillary  General Axillary Region: Bilateral - Description - Normal. Tenderness - Non Tender.    Assessment & Plan (Thomas A. Cornett MD; 09/30/2018 11:42 AM)  BREAST CANCER, LEFT (C50.912) Impression: GOOD LUMPECTOMY CANDIDATE BUT HER 2 NEU POSITIVE AND WILL NEED CHEMOTHERAPY  DISCUSSED CHEMOTHERAPY BEFORE AND AFTER SURGERY  SHE HAS OPTED FOR NEOADJUVANT CHEMOTHERAPY  PORT PLACEMENT FOLLOW BY BREAST CONSERVATION SURGERY Pt requires port placement for chemotherapy. Risk include bleeding, infection, pneumothorax, hemothorax, mediastinal injury, nerve injury , blood vessel injury, strke, blood clots, death, migration. embolization and need for additional procedures. Pt agrees to proceed.  Current Plans Use of a central venous catheter for intravenous therapy was discussed. Technique of catheter placement using ultrasound and fluoroscopy guidance was discussed. Risks such as bleeding, infection, pneumothorax, catheter occlusion, reoperation, and other risks were discussed. I noted a good likelihood this will help address the problem. Questions were answered. The patient expressed understanding & wishes to proceed. Pt Education - [employer] Free Text Education/Instructions: discussed with patient and provided information. 

## 2018-09-30 NOTE — Patient Instructions (Signed)

## 2018-09-30 NOTE — Progress Notes (Signed)
START ON PATHWAY REGIMEN - Breast     A cycle is every 21 days:     Pertuzumab      Pertuzumab      Trastuzumab-xxxx      Trastuzumab-xxxx      Carboplatin      Docetaxel   **Always confirm dose/schedule in your pharmacy ordering system**  Patient Characteristics: Preoperative or Nonsurgical Candidate (Clinical Staging), Neoadjuvant Therapy followed by Surgery, Invasive Disease, Chemotherapy, HER2 Positive, ER Positive Therapeutic Status: Preoperative or Nonsurgical Candidate (Clinical Staging) AJCC M Category: cM0 AJCC Grade: G3 Breast Surgical Plan: Neoadjuvant Therapy followed by Surgery ER Status: Positive (+) AJCC 8 Stage Grouping: IA HER2 Status: Positive (+) AJCC T Category: cT1c AJCC N Category: cN0 PR Status: Positive (+) Intent of Therapy: Curative Intent, Discussed with Patient

## 2018-09-30 NOTE — Progress Notes (Signed)
Mri b

## 2018-09-30 NOTE — Therapy (Signed)
[place], [place], [address] Phone: [phone]   Fax:  [phone]  Physical Therapy Evaluation  Patient Details  Name: Natasha Huffman MRN: [id number] Date of Birth: [birth year] Referring Provider (PT): Dr. [name]   Encounter Date: 09/30/2018  PT End of Session - 09/30/18 1342    Visit Number  1    Number of Visits  1    PT Start Time  1103    PT Stop Time  1127    PT Time Calculation (min)  24 min    Activity Tolerance  Patient tolerated treatment well    Behavior During Therapy  [facility] for tasks assessed/performed       Past Medical History:  Diagnosis Date  . Gallstones   . GERD (gastroesophageal reflux disease)     Past Surgical History:  Procedure Laterality Date  . [place]; 1995  . CHOLECYSTECTOMY N/A 02/22/2015   Procedure: LAPAROSCOPIC CHOLECYSTECTOMY WITH INTRAOPERATIVE CHOLANGIOGRAM;  Surgeon: [name], MD;  Location: [place];  Service: General;  Laterality: N/A;  . LAPAROSCOPIC CHOLECYSTECTOMY  02/22/2015  . TUBAL LIGATION  1995    There were no vitals filed for this visit.   Subjective Assessment - 09/30/18 1318    Subjective  Patient reports she is here today to be seen by her medical team for her newly diagnosed left breast cancer.    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 It measures 1.8 cm and is located in the upper outer quadrant with a Ki67 of 40%.     Patient Stated Goals  reduce lymphedema risk and learn post op shoulder ROM HEP    Currently in Pain?  No/denies         [facility] PT Assessment - 09/30/18 0001      Assessment   Medical Diagnosis  Left breast cancer    Referring Provider (PT)  Dr. Marcello Moores Cornett    Onset Date/Surgical Date  09/23/18    Hand Dominance  Right    Prior Therapy  None      Precautions    Precautions  Other (comment)    Precaution Comments  active cancer      Restrictions   Weight Bearing Restrictions  No      Balance Screen   Has the patient fallen in the past 6 months  No    Has the patient had a decrease in activity level because of a fear of falling?   No    Is the patient reluctant to leave their home because of a fear of falling?   No      Home Environment   Living Environment  Private residence    Living Arrangements  Spouse/significant other;Children   Husband and 3 boys ages 88, 68, 47; 2 foster kids; 1 adopted   Available Help at Discharge  Family      Prior Function   Level of Independence  Independent    Vocation  Full time employment    Biomedical scientist  IT at [employer]  She does not exercise      Cognition   Overall Cognitive Status  Within Functional Limits for tasks assessed      Posture/Postural Control   Posture/Postural Control  Postural limitations    Postural Limitations  Rounded Shoulders;Forward head  ROM / Strength   AROM / PROM / Strength  AROM;Strength      AROM   AROM Assessment Site  Shoulder;Cervical    Right/Left Shoulder  Right;Left    Right Shoulder Extension  60 Degrees    Right Shoulder Flexion  156 Degrees    Right Shoulder ABduction  161 Degrees    Right Shoulder Internal Rotation  55 Degrees    Right Shoulder External Rotation  90 Degrees    Left Shoulder Extension  56 Degrees    Left Shoulder Flexion  150 Degrees    Left Shoulder ABduction  158 Degrees    Left Shoulder Internal Rotation  65 Degrees    Left Shoulder External Rotation  90 Degrees    Cervical Flexion  WNL    Cervical Extension  WNL    Cervical - Right Side Bend  WNL    Cervical - Left Side Bend  WNL    Cervical - Right Rotation  WNL    Cervical - Left Rotation  WNL      Strength   Overall Strength  Within functional limits for tasks performed        LYMPHEDEMA/ONCOLOGY QUESTIONNAIRE - 09/30/18 1324      Type    Cancer Type  Left breast cancer      Lymphedema Assessments   Lymphedema Assessments  Upper extremities      Right Upper Extremity Lymphedema   10 cm Proximal to Olecranon Process  33.8 cm    Olecranon Process  27.5 cm    10 cm Proximal to Ulnar Styloid Process  26.1 cm    Just Proximal to Ulnar Styloid Process  17.6 cm    Across Hand at [employer]  19.6 cm    At [place] of 2nd Digit  6.4 cm      Left Upper Extremity Lymphedema   10 cm Proximal to Olecranon Process  32.8 cm    Olecranon Process  26.8 cm    10 cm Proximal to Ulnar Styloid Process  24.2 cm    Just Proximal to Ulnar Styloid Process  17.4 cm    Across Hand at [employer]  20.8 cm    At [place] of 2nd Digit  6.4 cm             Objective measurements completed on examination: See above findings.     Patient was instructed today in a home exercise program today for post op shoulder range of motion. These included active assist shoulder flexion in sitting, scapular retraction, wall walking with shoulder abduction, and hands behind head external rotation.  She was encouraged to do these twice a day, holding 3 seconds and repeating 5 times when permitted by her physician.        PT Education - 09/30/18 1341    Education Details  Lymphedema risk reduction and post op shoulder ROM HEP    Person(s) Educated  Patient;Spouse;Parent(s)    Methods  Explanation;Demonstration;Handout    Comprehension  Returned demonstration;Verbalized understanding           Breast Clinic Goals - 09/30/18 1355      Patient will be able to verbalize understanding of pertinent lymphedema risk reduction practices relevant to her diagnosis specifically related to skin care.   Time  1    Period  Days    Status  Achieved      Patient will be able to return demonstrate and/or verbalize understanding of the post-op home exercise  program related to regaining shoulder range of motion.   Time  1    Period  Days    Status  Achieved       Patient will be able to verbalize understanding of the importance of attending the postoperative After Breast Cancer Class for further lymphedema risk reduction education and therapeutic exercise.   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 It measures 1.8 cm and is located in the upper outer quadrant with a Ki67 of 40%. Her multidisciplinary medical team met prior to her assessments to determine a recommended treatment plan. She is planning to have neoadjuvant chemotherapy followed by a left lumpectomy and sentinel node biopsy, radiation, and anti-estrogen therapy. She will benefit from a post op PT visit to determine needs.    History and Personal Factors relevant to plan of care:  None    Clinical Presentation  Stable    Clinical Presentation due to:  Stable condition    Clinical Decision Making  Low    Rehab Potential  Excellent    Clinical Impairments Affecting Rehab Potential  None    PT Frequency  One time visit    PT Treatment/Interventions  ADLs/Self Care Home Management;Patient/family education;Therapeutic exercise    PT Next Visit Plan  Will reassess if MD refers post operatively    PT Home Exercise Plan  Post op shoulder ROM HEP    Consulted and Agree with Plan of Care  Patient;Family member/caregiver    Family Member Consulted  Husband       Patient will benefit from skilled therapeutic intervention in order to improve the following deficits and impairments:  Decreased range of motion, Impaired UE functional use, Pain, Decreased knowledge of precautions, Postural dysfunction  Visit Diagnosis: Malignant neoplasm of upper-outer quadrant of left breast in female, estrogen receptor positive ([place]) - Plan: PT plan of care cert/re-cert  Abnormal posture - Plan: PT plan of care cert/re-cert   Patient  will follow up at outpatient cancer rehab 3-4 weeks following surgery.  If the patient requires physical therapy at that time, a specific plan will be dictated and sent to the referring physician for approval. The patient was educated today on appropriate basic range of motion exercises to begin post operatively and the importance of attending the After Breast Cancer class following surgery.  Patient was educated today on lymphedema risk reduction practices as it pertains to recommendations that will benefit the patient immediately following surgery.  She verbalized good understanding.      Problem List Patient Active Problem List   Diagnosis Date Noted  . Malignant neoplasm of upper-outer quadrant of left breast in female, estrogen receptor positive ([place]) 09/28/2018  . Acute calculous cholecystitis 02/22/2015  . Knee pain 05/04/2012   Annia Friendly, PT 09/30/18 1:57 PM  [place], [place], [address] Phone: [phone]   Fax:  [phone]  Name: Natasha Huffman MRN: [id number] Date of Birth: Jan 23, 1961

## 2018-09-30 NOTE — Research (Signed)
DCP-001   Met with patient and her husband for approximately 15 minutes. Patient was given copies of the DCP-001 consent and authorization forms. Explained to patient that the purpose of the study and that it is a one time data collection study. Pt was also given this RN's business card. Plan is to reach out to patient in 3-4 days. Thanked patient and husband for their time and interest.  Johney Maine RN, BSN Clinical Research  09/30/18 11:18 AM

## 2018-09-30 NOTE — Progress Notes (Signed)
Nutrition Assessment  Reason for Assessment:  Pt seen in Breast Clinic  ASSESSMENT:   58 year old female with new diagnosis of breast cancer.  Past medical history reviewed  Patient reports normal appetite except for the last week after diagnosis.  Appetite has not been that good due to not knowing what is next.  Medications:  reviewed  Labs: no recent  Anthropometrics:   Height: 62 inches Weight: 207 lb 8 oz BMI: 37   NUTRITION DIAGNOSIS: Food and nutrition related knowledge deficit related to new diagnosis of breast cancer as evidenced by no prior need for nutrition related information.  INTERVENTION:   Discussed and provided packet of information regarding nutritional tips for breast cancer patients.  Questions answered.  Teachback method used.  Contact information provided and patient knows to contact me with questions/concerns.    MONITORING, EVALUATION, and GOAL: Pt will consume a healthy plant based diet to maintain lean body mass throughout treatment.   Natasha Huffman B. Zenia Resides, [place], [place] Registered Dietitian [phone] (pager)

## 2018-09-30 NOTE — H&P (Signed)
Natasha Huffman Documented: 09/30/2018 7:21 AM Location: [facility] Patient #: [id number] DOB: 06/29/1961 Undefined / Language: Undefined / Race: Black or African American Female  History of Present Illness (Natasha A. Cornett MD; 09/30/2018 11:43 AM) Patient words: the patient presents at the request of Natasha Huffman for SDM abnormality  1.8 cm mass noted left breast uoq core bx IDC HER 2 NEU POS ER POS PR POS   no hx of mass nipple discharge or pain                             ADDITIONAL INFORMATION: PROGNOSTIC INDICATORS Results: IMMUNOHISTOCHEMICAL AND MORPHOMETRIC ANALYSIS PERFORMED MANUALLY The tumor cells are POSITIVE for Her2 (3+). Of note, the tumor cells are heterogenous in regards to Her2 expression. Estrogen Receptor: 100%, POSITIVE, STRONG STAINING INTENSITY Progesterone Receptor: 2%, POSITIVE, STRONG STAINING INTENSITY Proliferation Marker Ki67: 40% REFERENCE RANGE ESTROGEN RECEPTOR NEGATIVE 0% POSITIVE =>1% REFERENCE RANGE PROGESTERONE RECEPTOR NEGATIVE 0% POSITIVE =>1% All controls stained appropriately Natasha KISH MD Pathologist, Electronic Signature ( Signed 09/25/2018) FINAL DIAGNOSIS Diagnosis Breast, left, needle core biopsy, 2 o'clock; ribbon clip - INVASIVE DUCTAL CARCINOMA. - SEE COMMENT. 1 of 2 FINAL for Natasha Huffman ([id number]) Microscopic Comment The carcinoma appears grade III. A breast prognostic profile will be performed and the results reported separately. The results were called to the [facility] on 09/24/2018. (JBK:kh 09/24/2018) Natasha KISH MD Pathologist, Electronic Signature (Case signed 09/24/2018) Specimen Gross and Clinical Information Specimen Comment In formalin 3:20, extracted < 5 minutes; new mass left breast on screening Specimen(s) Obtained: Breast, left, needle core biopsy, 2 o'clock; ribbon clip Specimen Clinical Information Suspicious mass; suspect IMC, possibly high  grade Gross Received in formalin (TIF = 3:20pm, CIT = less than five minutes) labeled with the patient's name and "Lt breast 2:00" are three cores of tan yellow soft tissue ranging from 1.1 to 1.5 cm, entirely submitted in one cassette. (AK 09/23/2018) Stain(s) used in Diagnosis: The following stain(s) were used in diagnosing the case: ER-ACIS, PR-ACIS, Her2 by IHC, KI-67-ACIS. The control(s) stained appropriately. Disclaimer Estrogen receptor (6F11), immunohistochemical stains are performed on formalin fixed, paraffin embedded tissue using a 3,3"-diaminobenzidine (DAB) chromogen and Leica Bond Autostainer System. The staining intensity of the nucleus is scored manually and is reported as the percentage of tumor cell nuclei demonstrating specific nuclear staining.Specimens are fixed in 10% Neutral Buffered Formalin for at least 6 hours and up to 72 hours. These tests have not be validated on decalcified tissue. Results should be interpreted with caution given the possibility of false negative results on decalcified specimens. IHC scores are reported using ASCO/CAP scoring criteria. An IHC Score of 0 or 1+ is NEGATIVE for HER2, 3+ is POSITIVE for HER2, and 2+ is EQUIVOCAL. Equivocal results are reflexed to either FISH or IHC testing. Specimens are fixed in 10% Neutral Buffered Formalin for at least 6 hours and up to 72 hours. These tests have not be validated on decalcified tissue. Results should be interpreted with caution given the possibility of false negative results on decalcified specimens. Ki-67 (MM1), immunohistochemical stains are performed on formalin fixed, paraffin embedded tissue using a 3,3"-diaminobenzidine (DAB) chromogen and Leica Bond Autostainer System. The staining intensity of the nucleus is scored manually and is reported as the percentage of tumor cell nuclei demonstrating specific nuclear staining.Specimens are fixed in 10% Neutral Buffered Formalin for at least 6 hours and up  to 72 hours. These   tests have not be validated on decalcified tissue. Results should be interpreted with caution given the possibility of false negative results on decalcified specimens. PR progesterone receptor (16), immunohistochemical stains are performed on formalin fixed, paraffin embedded tissue using a 3,3"-diaminobenzidine (DAB) chromogen and Leica Bond Autostainer System. The staining intensity of the nucleus is scored manually and is reported as the percentage of tumor cell nuclei demonstrating specific nuclear staining.Specimens are fixed in 10% Neutral Buffered Formalin for at least 6 hours and up to 72 hours. These tests have not be validated on decalcified tissue. Results should be interpreted with caution given the possibility of false negative results on decalcified specimens. Report signed out from the following location(s) Technical Component was performed at [facility] [address],[place] [address].CLIA:[id number],CAP:[id number]., Interpretation was performed at [facility] [address], , [place] [address]. CLIA #: [id number], 2 of 2         Patient returns after screening study for evaluation of a possible LEFT breast mass.  EXAM: DIGITAL DIAGNOSTIC LEFT MAMMOGRAM WITH CAD AND TOMO  ULTRASOUND LEFT BREAST  COMPARISON: To 11198 and earlier  ACR Breast Density Category b: There are scattered areas of fibroglandular density.  FINDINGS: Additional 2-D and 3-D images are performed. These views confirm presence of an irregular mass with irregular margins in the UPPER-OUTER QUADRANT of the LEFT breast.  Mammographic images were processed with CAD.  On physical exam, I palpate focal thickening in the 2 o'clock location of the LEFT breast.  Targeted ultrasound is performed, showing an irregular hypoechoic mass with internal vascularity in the 2 o'clock location of the LEFT breast 10 centimeters from the  nipple. Mass measures 1.8 x 1.1 x 1.0 centimeters. There is associated posterior acoustic enhancement.  Evaluation of the LEFT axilla is negative for adenopathy.  IMPRESSION: Suspicious mass in the 2 o'clock location of the LEFT breast. Tissue diagnosis is recommended.  RECOMMENDATION: Ultrasound-guided core biopsy of the LEFT breast. This will be performed later today.  I have discussed the findings and recommendations with the patient. Results were also provided in writing at the conclusion of the visit. If applicable, a reminder letter will be sent to the patient regarding the next appointment.  BI-RADS CATEGORY 5: Highly suggestive of malignancy.  Electronically Signed: By: Elizabeth Huffman M.D. On: 09/23/2018 15:00.  The patient is a 57 year old female.   Medication History (Sylvia Ledford, RN; 09/30/2018 7:22 AM) Medications Reconciled     Physical Exam (Natasha A. Cornett MD; 09/30/2018 11:39 AM)  General Mental Status-Alert. General Appearance-Consistent with stated age. Hydration-Well hydrated. Voice-Normal.  Chest and Lung Exam Chest and lung exam reveals -quiet, even and easy respiratory effort with no use of accessory muscles and on auscultation, normal breath sounds, no adventitious sounds and normal vocal resonance. Inspection Chest Wall - Normal. Back - normal.  Breast Breast - Left-Symmetric, Non Tender, No Biopsy scars, no Dimpling, No Inflammation, No Lumpectomy scars, No Mastectomy scars, No Peau d' Orange. Breast - Right-Symmetric, Non Tender, No Biopsy scars, no Dimpling, No Inflammation, No Lumpectomy scars, No Mastectomy scars, No Peau d' Orange. Breast Lump-No Palpable Breast Mass. Note: BRUISING LEFT BREAST  Cardiovascular Cardiovascular examination reveals -normal heart sounds, regular rate and rhythm with no murmurs and normal pedal pulses bilaterally.  Neurologic Neurologic evaluation reveals -alert and oriented x  3 with no impairment of recent or remote memory. Mental Status-Normal.  Musculoskeletal Normal Exam - Left-Upper Extremity Strength Normal and Lower Extremity Strength Normal.   Normal Exam - Right-Upper Extremity Strength Normal and Lower Extremity Strength Normal.  Lymphatic Head & Neck  General Head & Neck Lymphatics: Bilateral - Description - Normal. Axillary  General Axillary Region: Bilateral - Description - Normal. Tenderness - Non Tender.    Assessment & Plan (Jaquila Santelli A. Aftyn Nott MD; 09/30/2018 11:42 AM)  BREAST CANCER, LEFT (C50.912) Impression: GOOD LUMPECTOMY CANDIDATE BUT HER 2 NEU POSITIVE AND WILL NEED CHEMOTHERAPY  DISCUSSED CHEMOTHERAPY BEFORE AND AFTER SURGERY  SHE HAS OPTED FOR NEOADJUVANT CHEMOTHERAPY  PORT PLACEMENT FOLLOW BY BREAST CONSERVATION SURGERY Pt requires port placement for chemotherapy. Risk include bleeding, infection, pneumothorax, hemothorax, mediastinal injury, nerve injury , blood vessel injury, strke, blood clots, death, migration. embolization and need for additional procedures. Pt agrees to proceed.  Current Plans Use of a central venous catheter for intravenous therapy was discussed. Technique of catheter placement using ultrasound and fluoroscopy guidance was discussed. Risks such as bleeding, infection, pneumothorax, catheter occlusion, reoperation, and other risks were discussed. I noted a good likelihood this will help address the problem. Questions were answered. The patient expressed understanding & wishes to proceed. Pt Education - [facility] Free Text Education/Instructions: discussed with patient and provided information.

## 2018-09-30 NOTE — Progress Notes (Signed)
DISCONTINUE ON PATHWAY REGIMEN - Breast     A cycle is every 21 days:     Pertuzumab      Pertuzumab      Trastuzumab-xxxx      Trastuzumab-xxxx      Carboplatin      Docetaxel   **Always confirm dose/schedule in your pharmacy ordering system**  REASON: Other Reason PRIOR TREATMENT: [id number]: Docetaxel + Carboplatin + Trastuzumab + Pertuzumab (TCHP) q21 Days x 6 Cycles TREATMENT RESPONSE: Unable to Evaluate  START ON PATHWAY REGIMEN - Breast     A cycle is every 21 days:     Pertuzumab      Pertuzumab      Trastuzumab-xxxx      Trastuzumab-xxxx      Carboplatin      Docetaxel   **Always confirm dose/schedule in your pharmacy ordering system**  Patient Characteristics: Preoperative or Nonsurgical Candidate (Clinical Staging), Neoadjuvant Therapy followed by Surgery, Invasive Disease, Chemotherapy, HER2 Positive, ER Positive Therapeutic Status: Preoperative or Nonsurgical Candidate (Clinical Staging) AJCC M Category: cM0 AJCC Grade: G3 Breast Surgical Plan: Neoadjuvant Therapy followed by Surgery ER Status: Positive (+) AJCC 8 Stage Grouping: IA HER2 Status: Positive (+) AJCC T Category: cT1c AJCC N Category: cN0 PR Status: Positive (+) Intent of Therapy: Curative Intent, Discussed with Patient

## 2018-10-01 ENCOUNTER — Ambulatory Visit
Admission: RE | Admit: 2018-10-01 | Discharge: 2018-10-01 | Disposition: A | Discharge status: Home / Self Care | Payer: 59 | Source: Ambulatory Visit | Attending: Oncology | Admitting: Oncology

## 2018-10-01 DIAGNOSIS — C50912 Malignant neoplasm of unspecified site of left female breast: Secondary | ICD-10-CM | POA: Diagnosis not present

## 2018-10-01 DIAGNOSIS — Z170 Estrogen receptor positive status [ER+]: Principal | ICD-10-CM

## 2018-10-01 DIAGNOSIS — C50412 Malignant neoplasm of upper-outer quadrant of left female breast: Secondary | ICD-10-CM

## 2018-10-01 MED ORDER — GADOBUTROL 1 MMOL/ML IV SOLN
10.0000 mL | Freq: Once | INTRAVENOUS | Status: AC | PRN
  Administered 2018-10-01: 10 mL via INTRAVENOUS

## 2018-10-02 ENCOUNTER — Ambulatory Visit (HOSPITAL_COMMUNITY): Payer: 59

## 2018-10-05 ENCOUNTER — Other Ambulatory Visit: Payer: Self-pay | Admitting: Surgery

## 2018-10-05 ENCOUNTER — Telehealth: Payer: Self-pay

## 2018-10-05 DIAGNOSIS — R9389 Abnormal findings on diagnostic imaging of other specified body structures: Secondary | ICD-10-CM

## 2018-10-05 NOTE — Pre-Procedure Instructions (Signed)
Natasha Huffman  10/05/2018     Your procedure is scheduled on Thursday, February 27.  Report to [facility], Main Entrance or Entrance "A"  At 11:00 A.M.              Your surgery or procedure is scheduled for 1:00 PM   Call this number if you have problems the morning of surgery: [phone]  This is the number for the Pre- Surgical Desk               For any other questions, please call [phone], Monday - Friday 8 AM - 4 PM.   Remember:  Do not eat or drink after midnight Wednesday, February 26.   Take these medicines the morning of surgery with A SIP OF WATER: Take if needed: acetaminophen (TYLENOL) prochlorperazine (COMPAZINE)     STOP/ DO NOT Start taking Aspirin, Aspirin Products (Goody Powder, Excedrin Migraine), Ibuprofen (Advil), Naproxen (Aleve), Vitamins and Herbal Products (ie Fish Oil).  Special instructions:  [place]- Preparing For Surgery  Before surgery, you can play an important role. Because skin is not sterile, your skin needs to be as free of germs as possible. You can reduce the number of germs on your skin by washing with CHG (chlorahexidine gluconate) Soap before surgery.  CHG is an antiseptic cleaner which kills germs and bonds with the skin to continue killing germs even after washing.    Oral Hygiene is also important to reduce your risk of infection.  Remember - BRUSH YOUR TEETH THE MORNING OF SURGERY WITH YOUR REGULAR TOOTHPASTE  Please do not use if you have an allergy to CHG or antibacterial soaps. If your skin becomes reddened/irritated stop using the CHG.  Do not shave (including legs and underarms) for at least 48 hours prior to first CHG shower. It is OK to shave your face.  Please follow these instructions carefully.   1. Shower the NIGHT BEFORE SURGERY and the MORNING OF SURGERY with CHG.   2. If you chose to wash your hair, wash your hair first as usual with your normal shampoo.  3. After you shampoo, wash your face and  private area with the soap you use at home, then rinse your hair and body thoroughly to remove the shampoo and soap.  4. Use CHG as you would any other liquid soap. You can apply CHG directly to the skin and wash gently with a scrungie or a clean washcloth.   Apply the CHG Soap to your body ONLY FROM THE NECK DOWN.  Do not use on open wounds or open sores. Avoid contact with your eyes, ears, mouth and genitals (private parts).  5. Wash thoroughly, paying special attention to the area where your surgery will be performed.  6. Thoroughly rinse your body with warm water from the neck down.  7. DO NOT shower/wash with your normal soap after using and rinsing off the CHG Soap.  8. Pat yourself dry with a CLEAN TOWEL.  9. Wear CLEAN PAJAMAS to bed the night before surgery, wear comfortable clothes the morning of surgery  10. Place CLEAN SHEETS on your bed the night of your first shower and DO NOT SLEEP WITH PETS.   Day of Surgery: Shower as written above  Do not apply any deodorants/lotions.  Please wear clean clothes to the hospital/surgery center.   Remember to brush your teeth WITH YOUR REGULAR TOOTHPASTE.  Do not wear jewelry, make-up or nail polish.  Do not wear powders, or perfumes.  Do not shave 48 hours prior to surgery.  Men may shave face and neck.  Do not bring valuables to the hospital.  [facility] is not responsible for any belongings or valuables.  Contacts, dentures or bridgework may not be worn into surgery.  Leave your suitcase in the car.  After surgery it may be brought to your room.  For patients admitted to the hospital, discharge time will be determined by your treatment team.  Patients discharged the day of surgery will not be allowed to drive home.   Please read over the following fact sheets that you were given:  Managing Pain, Surgical Site Infection, Coughing and Deep Breathing

## 2018-10-05 NOTE — Telephone Encounter (Signed)
[facility] [id number] UPBEAT  Per previous conversation with patient, this RN called patient to follow up with her regarding UPBEAT study and to see if she had any questions. Pt was driving back from [place] through the [place] and had patchy service. Pt asked if this RN could call her back when there was better service. Pt asked this to call her back midmorning tomorrow 02/25. This RN agreed and wished her a safe drive. Thanked her for her time. Johney Maine RN, BSN Clinical Research  10/05/2018 9:38 AM

## 2018-10-06 ENCOUNTER — Encounter (HOSPITAL_COMMUNITY): Payer: Self-pay

## 2018-10-06 ENCOUNTER — Telehealth: Payer: Self-pay

## 2018-10-06 ENCOUNTER — Other Ambulatory Visit: Payer: Self-pay

## 2018-10-06 ENCOUNTER — Encounter (HOSPITAL_COMMUNITY)
Admission: RE | Admit: 2018-10-06 | Discharge: 2018-10-06 | Disposition: A | Discharge status: Home / Self Care | Payer: 59 | Source: Ambulatory Visit | Attending: Surgery | Admitting: Surgery

## 2018-10-06 DIAGNOSIS — Z01812 Encounter for preprocedural laboratory examination: Secondary | ICD-10-CM | POA: Insufficient documentation

## 2018-10-06 DIAGNOSIS — Z170 Estrogen receptor positive status [ER+]: Secondary | ICD-10-CM | POA: Diagnosis not present

## 2018-10-06 DIAGNOSIS — C50412 Malignant neoplasm of upper-outer quadrant of left female breast: Secondary | ICD-10-CM | POA: Diagnosis not present

## 2018-10-06 DIAGNOSIS — K219 Gastro-esophageal reflux disease without esophagitis: Secondary | ICD-10-CM | POA: Diagnosis not present

## 2018-10-06 HISTORY — DX: Malignant (primary) neoplasm, unspecified: C80.1

## 2018-10-06 LAB — COMPREHENSIVE METABOLIC PANEL
ALBUMIN: 4 g/dL (ref 3.5–5.0)
ALK PHOS: 106 U/L (ref 38–126)
ALT: 24 U/L (ref 0–44)
AST: 20 U/L (ref 15–41)
Anion gap: 7 (ref 5–15)
BUN: 11 mg/dL (ref 6–20)
CO2: 27 mmol/L (ref 22–32)
Calcium: 9.2 mg/dL (ref 8.9–10.3)
Chloride: 107 mmol/L (ref 98–111)
Creatinine, Ser: 0.68 mg/dL (ref 0.44–1.00)
GFR calc Af Amer: >60 mL/min (ref >60)
GFR calc non Af Amer: >60 mL/min (ref >60)
GLUCOSE: 114 mg/dL — AB (ref 70–99)
Potassium: 3.9 mmol/L (ref 3.5–5.1)
Sodium: 141 mmol/L (ref 135–145)
Total Bilirubin: 0.4 mg/dL (ref 0.3–1.2)
Total Protein: 7.4 g/dL (ref 6.5–8.1)

## 2018-10-06 LAB — CBC WITH DIFFERENTIAL/PLATELET
ABS IMMATURE GRANULOCYTES: 0.01 10*3/uL (ref 0.00–0.07)
Basophils Absolute: 0 10*3/uL (ref 0.0–0.1)
Basophils Relative: 1 %
Eosinophils Absolute: 0.1 10*3/uL (ref 0.0–0.5)
Eosinophils Relative: 2 %
HCT: 42 % (ref 36.0–46.0)
HEMOGLOBIN: 12.7 g/dL (ref 12.0–15.0)
Immature Granulocytes: 0 %
Lymphocytes Relative: 39 %
Lymphs Abs: 1.7 10*3/uL (ref 0.7–4.0)
MCH: 26.2 pg (ref 26.0–34.0)
MCHC: 30.2 g/dL (ref 30.0–36.0)
MCV: 86.8 fL (ref 80.0–100.0)
Monocytes Absolute: 0.4 10*3/uL (ref 0.1–1.0)
Monocytes Relative: 8 %
NEUTROS ABS: 2.3 10*3/uL (ref 1.7–7.7)
NEUTROS PCT: 50 %
Platelets: 275 10*3/uL (ref 150–400)
RBC: 4.84 MIL/uL (ref 3.87–5.11)
RDW: 13.4 % (ref 11.5–15.5)
WBC: 4.5 10*3/uL (ref 4.0–10.5)
nRBC: 0 % (ref 0.0–0.2)

## 2018-10-06 NOTE — Pre-Procedure Instructions (Signed)
Natasha Huffman  10/06/2018      [facility] at [place], [place] - [address] [place] [address] Phone: [phone] Fax: [phone]  [place], [place] - [address] [place] [address] Phone: [phone] Fax: [phone]    Your procedure is scheduled on 10/08/2018  Report to [facility] Admitting at 11:00 A.M.  Call this number if you have problems the morning of surgery:  [phone]   Remember:  Do not eat  Solid food after midnight.  You may drink clear liquids until 10:00 A.M.  Day of surgery: Clear liquids allowed are:                    Water, Juice (non-citric and without pulp), Carbonated beverages, Clear Tea, Black Coffee only, Plain Jell-O only, Gatorade and Plain Popsicles only    Take these medicines the morning of surgery with A SIP OF WATER : NOTHING    Do not wear jewelry, make-up or nail polish.  Do not wear lotions, powders, or perfumes, or deodorant.  Do not shave 48 hours prior to surgery.   Do not bring valuables to the hospital.  [facility] is not responsible for any belongings or valuables.  Contacts, dentures or bridgework may not be worn into surgery.  Leave your suitcase in the car.  After surgery it may be brought to your room.  For patients admitted to the hospital, discharge time will be determined by your treatment team.  Patients discharged the day of surgery will not be allowed to drive home.   Name and phone number of your driver:   Spouse  Special instructions:  Special Instructions: [place] - Preparing for Surgery  Before surgery, you can play an important role.  Because skin is not sterile, your skin needs to be as free of germs as possible.  You can reduce the number of germs on you skin by washing with CHG (chlorahexidine gluconate) soap before surgery.  CHG is an antiseptic cleaner  which kills germs and bonds with the skin to continue killing germs even after washing.  Please DO NOT use if you have an allergy to CHG or antibacterial soaps.  If your skin becomes reddened/irritated stop using the CHG and inform your nurse when you arrive at Short Stay.  Do not shave (including legs and underarms) for at least 48 hours prior to the first CHG shower.  You may shave your face.  Please follow these instructions carefully:   1.  Shower with CHG Soap the night before surgery and the  morning of Surgery.  2.  If you choose to wash your hair, wash your hair first as usual with your  normal shampoo.  3.  After you shampoo, rinse your hair and body thoroughly to remove the  Shampoo.  4.  Use CHG as you would any other liquid soap.  You can apply chg directly to the skin and wash gently with scrungie or a clean washcloth.  5.  Apply the CHG Soap to your body ONLY FROM THE NECK DOWN.    Do not use on open wounds or open sores.  Avoid contact with your eyes, ears, mouth and genitals (private parts).  Wash genitals (private parts)   with your normal soap.  6.  Wash thoroughly, paying special attention to the area where your surgery  will be performed.  7.  Thoroughly rinse your body with warm water from the neck down.  8.  DO NOT shower/wash with your normal soap after using and rinsing off   the CHG Soap.  9.  Pat yourself dry with a clean towel.            10.  Wear clean pajamas.            11.  Place clean sheets on your bed the night of your first shower and do not sleep with pets.  Day of Surgery  Do not apply any lotions/deodorants the morning of surgery.  Please wear clean clothes to the hospital/surgery center.  Please read over the following fact sheets that you were given. Pain Booklet, Coughing and Deep Breathing and Surgical Site Infection Prevention

## 2018-10-06 NOTE — Progress Notes (Signed)
PCP - R. Ehinger Cardiologist - n/a  Chest x-ray - n/a EKG - last 2018 Stress Test - n/a ECHO - scheduled for 10/07/2018 Cardiac Cath - none  Sleep Study - n/a CPAP - n/a  Fasting Blood Sugar - n/a Checks Blood Sugar _n/a____ times a day  Blood Thinner Instructions:n/a Aspirin Instructions:n/a  Anesthesia review: n/a  Patient denies shortness of breath, fever, cough and chest pain at PAT appointment   Patient verbalized understanding of instructions that were given to them at the PAT appointment. Patient was also instructed that they will need to review over the PAT instructions again at home before surgery.

## 2018-10-06 NOTE — Research (Signed)
DCP-001  Talked with pt for approximately 5 minutes. This RN asked if pt was okay for this RN to follow up with her in a couple weeks regarding the DCP-001 study, which is a one time data collection study. Pt stated that that was okay. Thanked patient for her time and interest. Johney Maine RN, BSN Clinical Research  10/06/18 11:39 AM

## 2018-10-06 NOTE — Telephone Encounter (Signed)
[facility] [id number] UPBEAT  This RN called back as patient requested at 1130am. Pt stated that at this time she just felt she "had a lot going on" and that doing the clinical trial was "too much." This RN thanked her considering the trial and for her time. Johney Maine RN, BSN Clinical Research  10/06/2018 11:35 AM

## 2018-10-06 NOTE — Telephone Encounter (Signed)
1021- Pt requested this RN call her back in an hour.

## 2018-10-07 ENCOUNTER — Ambulatory Visit (HOSPITAL_COMMUNITY)
Admission: RE | Admit: 2018-10-07 | Discharge: 2018-10-07 | Disposition: A | Discharge status: Home / Self Care | Payer: 59 | Source: Ambulatory Visit | Attending: Oncology | Admitting: Oncology

## 2018-10-07 ENCOUNTER — Inpatient Hospital Stay: Payer: 59

## 2018-10-07 DIAGNOSIS — Z170 Estrogen receptor positive status [ER+]: Secondary | ICD-10-CM | POA: Insufficient documentation

## 2018-10-07 DIAGNOSIS — C50412 Malignant neoplasm of upper-outer quadrant of left female breast: Secondary | ICD-10-CM | POA: Diagnosis not present

## 2018-10-07 DIAGNOSIS — K219 Gastro-esophageal reflux disease without esophagitis: Secondary | ICD-10-CM | POA: Diagnosis not present

## 2018-10-07 MED ORDER — CEFAZOLIN SODIUM-DEXTROSE 2-4 GM/100ML-% IV SOLN
2.0000 g | INTRAVENOUS | Status: AC
  Administered 2018-10-08: 2 g via INTRAVENOUS
  Filled 2018-10-07: qty 100

## 2018-10-07 NOTE — Progress Notes (Signed)
  Echocardiogram 2D Echocardiogram has been performed.  Mithcell Schumpert G Nicole Hafley 10/07/2018, 11:10 AM

## 2018-10-08 ENCOUNTER — Ambulatory Visit (HOSPITAL_COMMUNITY): Payer: 59 | Admitting: Anesthesiology

## 2018-10-08 ENCOUNTER — Encounter (HOSPITAL_COMMUNITY)
Admission: RE | Disposition: A | Discharge status: Home / Self Care | Payer: Self-pay | Source: Home / Self Care | Attending: Surgery

## 2018-10-08 ENCOUNTER — Ambulatory Visit (HOSPITAL_COMMUNITY): Payer: 59

## 2018-10-08 ENCOUNTER — Encounter (HOSPITAL_COMMUNITY): Payer: Self-pay

## 2018-10-08 ENCOUNTER — Ambulatory Visit (HOSPITAL_COMMUNITY)
Admission: RE | Admit: 2018-10-08 | Discharge: 2018-10-08 | Disposition: A | Discharge status: Home / Self Care | Payer: 59 | Attending: Surgery | Admitting: Surgery

## 2018-10-08 ENCOUNTER — Other Ambulatory Visit: Payer: Self-pay

## 2018-10-08 DIAGNOSIS — K219 Gastro-esophageal reflux disease without esophagitis: Secondary | ICD-10-CM | POA: Insufficient documentation

## 2018-10-08 DIAGNOSIS — Z800 Family history of malignant neoplasm of digestive organs: Secondary | ICD-10-CM | POA: Diagnosis not present

## 2018-10-08 DIAGNOSIS — Z9049 Acquired absence of other specified parts of digestive tract: Secondary | ICD-10-CM | POA: Insufficient documentation

## 2018-10-08 DIAGNOSIS — Z452 Encounter for adjustment and management of vascular access device: Secondary | ICD-10-CM | POA: Diagnosis not present

## 2018-10-08 DIAGNOSIS — Z8249 Family history of ischemic heart disease and other diseases of the circulatory system: Secondary | ICD-10-CM | POA: Diagnosis not present

## 2018-10-08 DIAGNOSIS — R918 Other nonspecific abnormal finding of lung field: Secondary | ICD-10-CM | POA: Diagnosis not present

## 2018-10-08 DIAGNOSIS — Z170 Estrogen receptor positive status [ER+]: Secondary | ICD-10-CM | POA: Diagnosis not present

## 2018-10-08 DIAGNOSIS — C50912 Malignant neoplasm of unspecified site of left female breast: Secondary | ICD-10-CM | POA: Diagnosis not present

## 2018-10-08 DIAGNOSIS — C50412 Malignant neoplasm of upper-outer quadrant of left female breast: Secondary | ICD-10-CM | POA: Insufficient documentation

## 2018-10-08 DIAGNOSIS — Z419 Encounter for procedure for purposes other than remedying health state, unspecified: Secondary | ICD-10-CM

## 2018-10-08 DIAGNOSIS — Z95828 Presence of other vascular implants and grafts: Secondary | ICD-10-CM

## 2018-10-08 HISTORY — PX: PORTACATH PLACEMENT: SHX2246

## 2018-10-08 SURGERY — INSERTION, TUNNELED CENTRAL VENOUS DEVICE, WITH PORT
Anesthesia: General | Site: Chest | Laterality: Right

## 2018-10-08 MED ORDER — HEPARIN SOD (PORK) LOCK FLUSH 100 UNIT/ML IV SOLN
INTRAVENOUS | Status: AC
  Filled 2018-10-08: qty 5

## 2018-10-08 MED ORDER — LACTATED RINGERS IV SOLN
INTRAVENOUS | Status: DC
  Administered 2018-10-08: 11:00:00 via INTRAVENOUS

## 2018-10-08 MED ORDER — SODIUM CHLORIDE 0.9 % IV SOLN
INTRAVENOUS | Status: DC | PRN
  Administered 2018-10-08: 500 mL

## 2018-10-08 MED ORDER — FENTANYL CITRATE (PF) 100 MCG/2ML IJ SOLN
25.0000 ug | INTRAMUSCULAR | Status: DC | PRN
  Administered 2018-10-08 (×2): 25 ug via INTRAVENOUS

## 2018-10-08 MED ORDER — BUPIVACAINE HCL (PF) 0.5 % IJ SOLN
INTRAMUSCULAR | Status: AC
  Filled 2018-10-08: qty 30

## 2018-10-08 MED ORDER — OXYCODONE HCL 5 MG PO TABS: 5.0000 mg | ORAL_TABLET | Freq: Four times a day (QID) | ORAL | 0 refills | Status: DC | PRN

## 2018-10-08 MED ORDER — CHLORHEXIDINE GLUCONATE CLOTH 2 % EX PADS: 6.0000 | MEDICATED_PAD | Freq: Once | CUTANEOUS | Status: DC

## 2018-10-08 MED ORDER — ONDANSETRON HCL 4 MG/2ML IJ SOLN
INTRAMUSCULAR | Status: DC | PRN
  Administered 2018-10-08: 4 mg via INTRAVENOUS

## 2018-10-08 MED ORDER — FENTANYL CITRATE (PF) 250 MCG/5ML IJ SOLN
INTRAMUSCULAR | Status: AC
  Filled 2018-10-08: qty 5

## 2018-10-08 MED ORDER — OXYCODONE HCL 5 MG PO TABS
ORAL_TABLET | ORAL | Status: AC
  Filled 2018-10-08: qty 1

## 2018-10-08 MED ORDER — 0.9 % SODIUM CHLORIDE (POUR BTL) OPTIME
TOPICAL | Status: DC | PRN
  Administered 2018-10-08: 1000 mL

## 2018-10-08 MED ORDER — LIDOCAINE HCL (CARDIAC) PF 100 MG/5ML IV SOSY
PREFILLED_SYRINGE | INTRAVENOUS | Status: DC | PRN
  Administered 2018-10-08: 100 mg via INTRAVENOUS

## 2018-10-08 MED ORDER — FENTANYL CITRATE (PF) 100 MCG/2ML IJ SOLN
INTRAMUSCULAR | Status: AC
  Filled 2018-10-08: qty 2

## 2018-10-08 MED ORDER — PROPOFOL 10 MG/ML IV BOLUS
INTRAVENOUS | Status: DC | PRN
  Administered 2018-10-08: 200 mg via INTRAVENOUS

## 2018-10-08 MED ORDER — MIDAZOLAM HCL 2 MG/2ML IJ SOLN
INTRAMUSCULAR | Status: AC
  Filled 2018-10-08: qty 2

## 2018-10-08 MED ORDER — GABAPENTIN 300 MG PO CAPS
300.0000 mg | ORAL_CAPSULE | ORAL | Status: AC
  Administered 2018-10-08: 300 mg via ORAL
  Filled 2018-10-08: qty 1

## 2018-10-08 MED ORDER — BUPIVACAINE HCL (PF) 0.25 % IJ SOLN
INTRAMUSCULAR | Status: DC | PRN
  Administered 2018-10-08: 18 mL

## 2018-10-08 MED ORDER — SODIUM CHLORIDE 0.9 % IV SOLN
INTRAVENOUS | Status: AC
  Filled 2018-10-08: qty 1.2

## 2018-10-08 MED ORDER — ACETAMINOPHEN 500 MG PO TABS
1000.0000 mg | ORAL_TABLET | ORAL | Status: AC
  Administered 2018-10-08: 1000 mg via ORAL
  Filled 2018-10-08: qty 2

## 2018-10-08 MED ORDER — FENTANYL CITRATE (PF) 250 MCG/5ML IJ SOLN
INTRAMUSCULAR | Status: DC | PRN
  Administered 2018-10-08: 25 ug via INTRAVENOUS
  Administered 2018-10-08 (×3): 50 ug via INTRAVENOUS
  Administered 2018-10-08: 25 ug via INTRAVENOUS

## 2018-10-08 MED ORDER — BUPIVACAINE HCL (PF) 0.25 % IJ SOLN
INTRAMUSCULAR | Status: AC
  Filled 2018-10-08: qty 30

## 2018-10-08 MED ORDER — CELECOXIB 200 MG PO CAPS
200.0000 mg | ORAL_CAPSULE | ORAL | Status: AC
  Administered 2018-10-08: 200 mg via ORAL
  Filled 2018-10-08: qty 1

## 2018-10-08 MED ORDER — PROPOFOL 10 MG/ML IV BOLUS
INTRAVENOUS | Status: AC
  Filled 2018-10-08: qty 20

## 2018-10-08 MED ORDER — MIDAZOLAM HCL 5 MG/5ML IJ SOLN
INTRAMUSCULAR | Status: DC | PRN
  Administered 2018-10-08: 2 mg via INTRAVENOUS

## 2018-10-08 MED ORDER — HEPARIN SOD (PORK) LOCK FLUSH 100 UNIT/ML IV SOLN
INTRAVENOUS | Status: DC | PRN
  Administered 2018-10-08: 400 [IU] via INTRAVENOUS

## 2018-10-08 MED ORDER — PHENYLEPHRINE 40 MCG/ML (10ML) SYRINGE FOR IV PUSH (FOR BLOOD PRESSURE SUPPORT)
PREFILLED_SYRINGE | INTRAVENOUS | Status: DC | PRN
  Administered 2018-10-08 (×2): 80 ug via INTRAVENOUS

## 2018-10-08 MED ORDER — DEXAMETHASONE SODIUM PHOSPHATE 10 MG/ML IJ SOLN
INTRAMUSCULAR | Status: DC | PRN
  Administered 2018-10-08: 10 mg via INTRAVENOUS

## 2018-10-08 MED ORDER — PROMETHAZINE HCL 25 MG/ML IJ SOLN: 6.2500 mg | INTRAMUSCULAR | Status: DC | PRN

## 2018-10-08 MED ORDER — OXYCODONE HCL 5 MG PO TABS
5.0000 mg | ORAL_TABLET | Freq: Once | ORAL | Status: AC
  Administered 2018-10-08: 5 mg via ORAL

## 2018-10-08 MED FILL — oxyCODONE HCL 5 MG TABS: 5 | 3 days supply | Qty: 12 | Fill #0

## 2018-10-08 SURGICAL SUPPLY — 39 items
BAG DECANTER FOR FLEXI CONT (MISCELLANEOUS) ×3 IMPLANT
CHLORAPREP W/TINT 26ML (MISCELLANEOUS) ×3 IMPLANT
COVER SURGICAL LIGHT HANDLE (MISCELLANEOUS) ×3 IMPLANT
COVER TRANSDUCER ULTRASND GEL (DRAPE) ×3 IMPLANT
COVER WAND RF STERILE (DRAPES) IMPLANT
CRADLE DONUT ADULT HEAD (MISCELLANEOUS) ×3 IMPLANT
DERMABOND ADVANCED (GAUZE/BANDAGES/DRESSINGS) ×2
DERMABOND ADVANCED .7 DNX12 (GAUZE/BANDAGES/DRESSINGS) ×1 IMPLANT
DRAPE C-ARM 42X72 X-RAY (DRAPES) ×3 IMPLANT
ELECT CAUTERY BLADE 6.4 (BLADE) ×3 IMPLANT
ELECT REM PT RETURN 9FT ADLT (ELECTROSURGICAL) ×3
ELECTRODE REM PT RTRN 9FT ADLT (ELECTROSURGICAL) ×1 IMPLANT
GAUZE 4X4 16PLY RFD (DISPOSABLE) ×3 IMPLANT
GEL ULTRASOUND 20GR AQUASONIC (MISCELLANEOUS) IMPLANT
GLOVE BIO SURGEON STRL SZ8 (GLOVE) ×3 IMPLANT
GLOVE BIOGEL PI IND STRL 8 (GLOVE) ×1 IMPLANT
GLOVE BIOGEL PI INDICATOR 8 (GLOVE) ×2
GOWN STRL REUS W/ TWL LRG LVL3 (GOWN DISPOSABLE) ×1 IMPLANT
GOWN STRL REUS W/ TWL XL LVL3 (GOWN DISPOSABLE) ×1 IMPLANT
GOWN STRL REUS W/TWL LRG LVL3 (GOWN DISPOSABLE) ×2
GOWN STRL REUS W/TWL XL LVL3 (GOWN DISPOSABLE) ×2
INTRODUCER COOK 11FR (CATHETERS) IMPLANT
KIT BASIN OR (CUSTOM PROCEDURE TRAY) ×3 IMPLANT
KIT PORT POWER 8FR ISP CVUE (Port) ×3 IMPLANT
KIT TURNOVER KIT B (KITS) ×3 IMPLANT
NS IRRIG 1000ML POUR BTL (IV SOLUTION) ×3 IMPLANT
PAD ARMBOARD 7.5X6 YLW CONV (MISCELLANEOUS) ×3 IMPLANT
PENCIL BUTTON HOLSTER BLD 10FT (ELECTRODE) ×3 IMPLANT
SET INTRODUCER 12FR PACEMAKER (INTRODUCER) IMPLANT
SET SHEATH INTRODUCER 10FR (MISCELLANEOUS) IMPLANT
SHEATH COOK PEEL AWAY SET 9F (SHEATH) IMPLANT
SUT MNCRL AB 4-0 PS2 18 (SUTURE) ×3 IMPLANT
SUT PROLENE 2 0 SH 30 (SUTURE) ×3 IMPLANT
SUT VIC AB 3-0 SH 27 (SUTURE) ×2
SUT VIC AB 3-0 SH 27X BRD (SUTURE) ×1 IMPLANT
SYR 5ML LUER SLIP (SYRINGE) ×3 IMPLANT
TOWEL OR 17X24 6PK STRL BLUE (TOWEL DISPOSABLE) ×3 IMPLANT
TOWEL OR 17X26 10 PK STRL BLUE (TOWEL DISPOSABLE) ×3 IMPLANT
TRAY LAPAROSCOPIC MC (CUSTOM PROCEDURE TRAY) ×3 IMPLANT

## 2018-10-08 NOTE — Transfer of Care (Signed)
Immediate Anesthesia Transfer of Care Note  Patient: Natasha Huffman  Procedure(s) Performed: INSERTION PORT-A-CATH WITH ULTRASOUND (Right Chest)  Patient Location: PACU  Anesthesia Type:General  Level of Consciousness: awake, alert , oriented and patient cooperative  Airway & Oxygen Therapy: Patient Spontanous Breathing and Patient connected to nasal cannula oxygen  Post-op Assessment: Report given to RN and Post -op Vital signs reviewed and stable  Post vital signs: Reviewed and stable  Last Vitals:  Vitals Value Taken Time  BP 126/80 10/08/2018  2:04 PM  Temp    Pulse 78 10/08/2018  2:07 PM  Resp 13 10/08/2018  2:07 PM  SpO2 98 % 10/08/2018  2:07 PM  Vitals shown include unvalidated device data.  Last Pain:  Vitals:   10/08/18 1124  TempSrc:   PainSc: 0-No pain      Patients Stated Pain Goal: 2 (18/34/37 3578)  Complications: No apparent anesthesia complications

## 2018-10-08 NOTE — Progress Notes (Signed)
FMLA successfully faxed to [employer] at [phone]. Mailed copy to patient address on file. 

## 2018-10-08 NOTE — Discharge Instructions (Signed)
    PORT-A-CATH: POST OP INSTRUCTIONS  Always review your discharge instruction sheet given to you by the facility where your surgery was performed.   1. A prescription for pain medication may be given to you upon discharge. Take your pain medication as prescribed, if needed. If narcotic pain medicine is not needed, then you make take acetaminophen (Tylenol) or ibuprofen (Advil) as needed.  2. Take your usually prescribed medications unless otherwise directed. 3. If you need a refill on your pain medication, please contact our office. All narcotic pain medicine now requires a paper prescription.  Phoned in and fax refills are no longer allowed by law.  Prescriptions will not be filled after 5 pm or on weekends.  4. You should follow a light diet for the remainder of the day after your procedure. 5. Most patients will experience some mild swelling and/or bruising in the area of the incision. It may take several days to resolve. 6. It is common to experience some constipation if taking pain medication after surgery. Increasing fluid intake and taking a stool softener (such as Colace) will usually help or prevent this problem from occurring. A mild laxative (Milk of Magnesia or Miralax) should be taken according to package directions if there are no bowel movements after 48 hours.  7. Unless discharge instructions indicate otherwise, you may remove your bandages 48 hours after surgery, and you may shower at that time. You may have steri-strips (small white skin tapes) in place directly over the incision.  These strips should be left on the skin for 7-10 days.  If your surgeon used Dermabond (skin glue) on the incision, you may shower in 24 hours.  The glue will flake off over the next 2-3 weeks.  8. If your port is left accessed at the end of surgery (needle left in port), the dressing cannot get wet and should only by changed by a healthcare professional. When the port is no longer accessed (when the  needle has been removed), follow step 7.   9. ACTIVITIES:  Limit activity involving your arms for the next 72 hours. Do no strenuous exercise or activity for 1 week. You may drive when you are no longer taking prescription pain medication, you can comfortably wear a seatbelt, and you can maneuver your car. 10.You may need to see your doctor in the office for a follow-up appointment.  Please       check with your doctor.  11.When you receive a new Port-a-Cath, you will get a product guide and        ID card.  Please keep them in case you need them.  WHEN TO CALL YOUR DOCTOR ([phone]): 1. Fever over 101.0 2. Chills 3. Continued bleeding from incision 4. Increased redness and tenderness at the site 5. Shortness of breath, difficulty breathing   The clinic staff is available to answer your questions during regular business hours. Please don't hesitate to call and ask to speak to one of the nurses or medical assistants for clinical concerns. If you have a medical emergency, go to the nearest emergency room or call 911.  A surgeon from [facility] is always on call at the hospital.     For further information, please visit [email or web address]      

## 2018-10-08 NOTE — Interval H&P Note (Signed)
History and Physical Interval Note:  10/08/2018 12:50 PM  Natasha Huffman  has presented today for surgery, with the diagnosis of BREAST CANCER  The various methods of treatment have been discussed with the patient and family. After consideration of risks, benefits and other options for treatment, the patient has consented to  Procedure(s): INSERTION PORT-A-CATH WITH ULTRASOUND (N/A) as a surgical intervention .  The patient's history has been reviewed, patient examined, no change in status, stable for surgery.  I have reviewed the patient's chart and labs.  Questions were answered to the patient's satisfaction.     [name]

## 2018-10-08 NOTE — Anesthesia Procedure Notes (Signed)
Procedure Name: LMA Insertion Date/Time: 10/08/2018 12:58 PM Performed by: Shirlyn Goltz, CRNA Pre-anesthesia Checklist: Patient identified, Emergency Drugs available, Suction available and Patient being monitored Patient Re-evaluated:Patient Re-evaluated prior to induction Oxygen Delivery Method: Circle system utilized Preoxygenation: Pre-oxygenation with 100% oxygen Induction Type: IV induction Ventilation: Mask ventilation without difficulty LMA: LMA inserted LMA Size: 4.0 Number of attempts: 1 Placement Confirmation: positive ETCO2 and breath sounds checked- equal and bilateral Tube secured with: Tape Dental Injury: Injury to lip

## 2018-10-08 NOTE — Anesthesia Preprocedure Evaluation (Signed)
Anesthesia Evaluation  Patient identified by MRN, date of birth, ID band Patient awake    Reviewed: Allergy & Precautions, NPO status , Patient's Chart, lab work & pertinent test results  Airway Mallampati: II  TM Distance: >3 FB Neck ROM: Full    Dental no notable dental hx.    Pulmonary neg pulmonary ROS,    Pulmonary exam normal breath sounds clear to auscultation       Cardiovascular negative cardio ROS Normal cardiovascular exam Rhythm:Regular Rate:Normal     Neuro/Psych negative neurological ROS  negative psych ROS   GI/Hepatic Neg liver ROS, GERD  ,  Endo/Other  negative endocrine ROS  Renal/GU negative Renal ROS  negative genitourinary   Musculoskeletal negative musculoskeletal ROS (+)   Abdominal   Peds negative pediatric ROS (+)  Hematology negative hematology ROS (+)   Anesthesia Other Findings   Reproductive/Obstetrics negative OB ROS                             Anesthesia Physical Anesthesia Plan  ASA: II  Anesthesia Plan: General   Post-op Pain Management:    Induction: Intravenous  PONV Risk Score and Plan: 3 and Ondansetron, Dexamethasone and Treatment may vary due to age or medical condition  Airway Management Planned: LMA  Additional Equipment:   Intra-op Plan:   Post-operative Plan: Extubation in OR  Informed Consent: I have reviewed the patients History and Physical, chart, labs and discussed the procedure including the risks, benefits and alternatives for the proposed anesthesia with the patient or authorized representative who has indicated his/her understanding and acceptance.     Dental advisory given  Plan Discussed with: CRNA and Surgeon  Anesthesia Plan Comments:         Anesthesia Quick Evaluation  

## 2018-10-08 NOTE — Anesthesia Postprocedure Evaluation (Signed)
Anesthesia Post Note  Patient: Natasha Huffman  Procedure(s) Performed: INSERTION PORT-A-CATH WITH ULTRASOUND (Right Chest)     Patient location during evaluation: PACU Anesthesia Type: General Level of consciousness: awake and alert Pain management: pain level controlled Vital Signs Assessment: post-procedure vital signs reviewed and stable Respiratory status: spontaneous breathing, nonlabored ventilation, respiratory function stable and patient connected to nasal cannula oxygen Cardiovascular status: blood pressure returned to baseline and stable Postop Assessment: no apparent nausea or vomiting Anesthetic complications: no    Last Vitals:  Vitals:   10/08/18 1434 10/08/18 1449  BP: 136/84 126/74  Pulse: 75 74  Resp: 20   Temp:    SpO2: 97% 100%    Last Pain:  Vitals:   10/08/18 1450  TempSrc:   PainSc: 0-No pain                 Esco Joslyn S

## 2018-10-08 NOTE — Op Note (Signed)
Preoperative diagnosis: PAC needed for chemotherapy   Postoperative diagnosis: Same  Procedure: Portacath Placement with U/S and C arm   Surgeon: Turner Daniels, MD, FACS  Anesthesia: General and 0.25 % marcaine with epinephrine  Clinical History and Indications: The patient is getting ready to begin chemotherapy for her cancer. She  needs a Port-A-Cath for venous access. Risk of bleeding, infection,  Collapse lung,  Death,  DVT,  Organ injury,  Mediastinal injury,  Injury to heart,  Injury to blood vessels,  Nerves,  Migration of catheter,  Embolization of catheter and the need for more surgery.  Description of Procedure: I have seen the patient in the holding area and confirmed the plans for the procedure as noted above. I reviewed the risks and complications again and the patient has no further questions. She wishes to proceed.   The patient was then taken to the operating room. After satisfactory general  anesthesia had been obtained the upper chest and lower neck were prepped and draped as a sterile field. The timeout was done.  The right internal jugular vein  was entered under U/S guidance  and the guidewire threaded into the superior vena cava right atrial area under fluoroscopic guidance. An incision was then made on the anterior chest wall and a subcutaneous pocket fashioned for the port reservoir.  The port tubing was then brought through a subcutaneous tunnel from the port site to the guidewire site.  The port and catheter were attached, locked  and flushed. The catheter was measured and cut to appropriate length.The dilator and peel-away sheath were then advanced over the guidewire while monitoring this with fluoroscopy. The guidewire and dilator were removed and the tubing threaded to approximately 22 cm. The peel-away sheath was then removed. The catheter aspirated and flushed easily. Using fluoroscopy the tip was in the superior vena cava right atrial junction area. It aspirated  and flushed easily. That aspirated and flushed easily.  The reservoir was secured to the fascia with 1 sutures of 2-0 Prolene. A final check with fluoroscopy was done to make sure we had no kinks and good positioning of the tip of the catheter. Everything appeared to be okay. The catheter was aspirated, flushed with dilute heparin and then concentrated aqueous heparin.  The incision was then closed with interrupted 3-0 Vicryl, and 4-0 Monocryl subcuticular with Dermabond on the skin.  There were no operative complications. Estimated blood loss was minimal. All counts were correct. The patient tolerated the procedure well.  Turner Daniels, MD, FACS

## 2018-10-09 ENCOUNTER — Encounter (HOSPITAL_COMMUNITY): Payer: Self-pay | Admitting: Surgery

## 2018-10-09 ENCOUNTER — Telehealth: Payer: Self-pay | Admitting: *Deleted

## 2018-10-09 NOTE — Telephone Encounter (Signed)
  Oncology Nurse Navigator Documentation  Navigator Location: [facility] (10/09/18 1100)   )Navigator Encounter Type: Telephone;[facility] Follow-up (10/09/18 1100) Telephone: Outgoing Call;Clinic/[facility] Follow-up (10/09/18 1100)                                                  Time Spent with Patient: 15 (10/09/18 1100)

## 2018-10-12 ENCOUNTER — Ambulatory Visit
Admission: RE | Admit: 2018-10-12 | Discharge: 2018-10-12 | Disposition: A | Discharge status: Home / Self Care | Payer: 59 | Source: Ambulatory Visit | Attending: Surgery | Admitting: Surgery

## 2018-10-12 DIAGNOSIS — R9389 Abnormal findings on diagnostic imaging of other specified body structures: Secondary | ICD-10-CM

## 2018-10-12 DIAGNOSIS — D242 Benign neoplasm of left breast: Secondary | ICD-10-CM | POA: Diagnosis not present

## 2018-10-12 MED ORDER — GADOBUTROL 1 MMOL/ML IV SOLN
10.0000 mL | Freq: Once | INTRAVENOUS | Status: AC | PRN
  Administered 2018-10-12: 10 mL via INTRAVENOUS

## 2018-10-14 ENCOUNTER — Telehealth: Payer: Self-pay | Admitting: Oncology

## 2018-10-14 ENCOUNTER — Inpatient Hospital Stay: Payer: 59 | Attending: Adult Health | Admitting: Adult Health

## 2018-10-14 ENCOUNTER — Encounter: Payer: Self-pay | Admitting: Adult Health

## 2018-10-14 VITALS — BP 144/71 | HR 78 | Temp 98.7°F | Resp 18 | Ht 62.0 in | Wt 207.3 lb

## 2018-10-14 DIAGNOSIS — Z5112 Encounter for antineoplastic immunotherapy: Secondary | ICD-10-CM | POA: Diagnosis not present

## 2018-10-14 DIAGNOSIS — R197 Diarrhea, unspecified: Secondary | ICD-10-CM | POA: Diagnosis not present

## 2018-10-14 DIAGNOSIS — C50412 Malignant neoplasm of upper-outer quadrant of left female breast: Secondary | ICD-10-CM | POA: Diagnosis not present

## 2018-10-14 DIAGNOSIS — Z79899 Other long term (current) drug therapy: Secondary | ICD-10-CM | POA: Diagnosis not present

## 2018-10-14 DIAGNOSIS — Z170 Estrogen receptor positive status [ER+]: Secondary | ICD-10-CM | POA: Diagnosis not present

## 2018-10-14 DIAGNOSIS — K219 Gastro-esophageal reflux disease without esophagitis: Secondary | ICD-10-CM | POA: Insufficient documentation

## 2018-10-14 DIAGNOSIS — Z800 Family history of malignant neoplasm of digestive organs: Secondary | ICD-10-CM | POA: Diagnosis not present

## 2018-10-14 DIAGNOSIS — Z5111 Encounter for antineoplastic chemotherapy: Secondary | ICD-10-CM | POA: Diagnosis not present

## 2018-10-14 DIAGNOSIS — Z780 Asymptomatic menopausal state: Secondary | ICD-10-CM | POA: Insufficient documentation

## 2018-10-14 NOTE — Progress Notes (Signed)
[place]  [phone] [phone]    ID: Natasha Huffman DOB: 1961-07-29  MR#: [id number]  HUT#:[id number]  Patient Care Team: [name], MD as PCP - General (Family Medicine) Rockwell Germany, RN as Oncology Nurse Navigator Mauro Kaufmann, RN as Oncology Nurse Navigator [name], MD as Consulting Physician (General Surgery) [name], [name], MD as Consulting Physician (Oncology) Kyung Rudd, MD as Consulting Physician (Radiation Oncology) OTHER MD:    CHIEF COMPLAINT: Triple positive breast cancer  CURRENT TREATMENT: Neoadjuvant chemotherapy   HISTORY OF CURRENT ILLNESS: Natasha Huffman had routine screening mammography on 09/23/2018 showing a possible abnormality in the left breast. She underwent unilateral left diagnostic mammography with tomography and left breast ultrasonography at [facility] on 09/23/2018 showing: Breast Density Category B. There is a mass with irregular margins in the upper-outer quadrant of the left breast. On physical exam, there is palpable focal thickening in the 2 o'clock location of the left breast. Sonographically, an irregular hypoechoic mass with internal vascularity in the 2 o'clock location of the left breast 10 cm from the nipple is seen. The mass measures 1.8 x 1.1 x 1.0 cm. There is associated posterior acoustic enhancement. Evaluation of the left axilla is negative for adenopathy.  Accordingly on 09/23/2018 she proceeded to biopsy of the left breast area in question. The pathology from this procedure showed ([id number]): invasive ductal carcinoma, grade III. Prognostic indicators significant for: estrogen receptor, 100% positive and progesterone receptor, 2% positive, both with strong staining intensity. Proliferation marker Ki67 at 40%. HER2 positive (3+) by immunohistochemistry.   The patient's subsequent history is as detailed below.   INTERVAL HISTORY: Natasha Huffman is here today for f/u and treatment  of her HER-2 positive breast cancer.  She is due to start neoadjuvant chemotherapy with Docetaxel, Carboplatin, Trastuzumab, beginning next week.  She will receive this on day 1 of a 21 day cycle with growth factor support with Udenyca on day 3.     REVIEW OF SYSTEMS: Natasha Huffman is doing well at baseline.  She underwent her port placement and is still sore from that, but is recovering well.  She has undergone her chemotherapy education class, and has picked up all of her anti nausea medications.  She notes an occasional difficulty sleeping, but otherwise is feeling well.    She has no unusual headaches or vision changes.  She is without fever, chills, nausea, vomiting, bowel/bladder changes.  She hasn't had any cough, shortness of breath, chest pain or palpitations.  A detailed ROS was otherwise non contributory.    PAST MEDICAL HISTORY: Past Medical History:  Diagnosis Date  . Cancer ([place]) 09/23/2018   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 ,   . Gallstones   . GERD (gastroesophageal reflux disease)    resolved      PAST SURGICAL HISTORY: Past Surgical History:  Procedure Laterality Date  . [place]; 1995  . CHOLECYSTECTOMY N/A 02/22/2015   Procedure: LAPAROSCOPIC CHOLECYSTECTOMY WITH INTRAOPERATIVE CHOLANGIOGRAM;  Surgeon: [name], MD;  Location: [place];  Service: General;  Laterality: N/A;  . LAPAROSCOPIC CHOLECYSTECTOMY  02/22/2015  . PORTACATH PLACEMENT Right 10/08/2018   Procedure: INSERTION PORT-A-CATH WITH ULTRASOUND;  Surgeon: [name], MD;  Location: [place];  Service: General;  Laterality: Right;  . TUBAL LIGATION  1995     FAMILY HISTORY: Family History  Problem Relation Age of Onset  . Hypertension Sister   . Colon cancer Maternal Aunt   . Breast cancer  Neg Hx    Norabelle's father died from sepsis at age 72. Patients' mother is 94 years old as of 09/2018. The patient has 3 brothers and 5 sisters. Patient denies anyone in her family having breast,  ovarian, prostate, or pancreatic cancer. Vassie's maternal aunt was diagnosed with colon cancer at age 76.    GYNECOLOGIC HISTORY:  No LMP recorded. Patient is postmenopausal. 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 Her husband, [name], works at a [employer] in [place]. 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 Natasha Huffman has two children, [name] and [name]. Natasha Huffman is 51, lives in [place], [place], and is a Animator. Natasha Huffman is 82, lives in [place], and works for the [employer]. Zuria has one grandchild. She attends the [employer] in [place]: Julius's husband, [name], is automatically her healthcare power of attorney.     HEALTH MAINTENANCE: Social History   Tobacco Use  . Smoking status: Never Smoker  . Smokeless tobacco: Never Used  Substance Use Topics  . Alcohol use: No  . Drug use: No    Colonoscopy: yes, 2015, [facility]  PAP: 09/2017  Bone density: no   No Known Allergies  Current Outpatient Medications  Medication Sig Dispense Refill  . acetaminophen (TYLENOL) 325 MG tablet Take 650 mg by mouth every 6 (six) hours as needed for moderate pain or headache.    . dexamethasone (DECADRON) 4 MG tablet Take 2 tablets (8 mg total) by mouth 2 (two) times daily. Start the day before Taxotere. Take once the day after, then 2 times a day x 2d. 30 tablet 1  . lidocaine-prilocaine (EMLA) cream Apply to affected area once 30 g 3  . LORazepam (ATIVAN) 0.5 MG tablet Take 1 tablet (0.5 mg total) by mouth at bedtime as needed (Nausea or vomiting). 20 tablet 0  . oxyCODONE (OXY IR/ROXICODONE) 5 MG immediate release tablet Take 1 tablet (5 mg total) by mouth every 6 (six) hours as needed for severe  pain. 12 tablet 0  . prochlorperazine (COMPAZINE) 10 MG tablet Take 1 tablet (10 mg total) by mouth every 6 (six) hours as needed (Nausea or vomiting). 30 tablet 1   No current facility-administered medications for this visit.      OBJECTIVE: Vitals:   10/14/18 1051  BP: (!) 144/71  Pulse: 78  Resp: 18  Temp: 98.7 F (37.1 C)  SpO2: 100%     Body mass index is 37.92 kg/m.   Wt Readings from Last 3 Encounters:  10/14/18 207 lb 4.8 oz (94 kg)  10/08/18 207 lb (93.9 kg)  10/06/18 207 lb 9.6 oz (94.2 kg)      ECOG FS:0 - Asymptomatic  GENERAL: Patient is a well appearing female in no acute distress HEENT:  Sclerae anicteric.  Oropharynx clear and moist. No ulcerations or evidence of oropharyngeal candidiasis. Neck is supple.  NODES:  No cervical, supraclavicular, or axillary lymphadenopathy palpated.  BREAST EXAM:  Deferred. Port in place and healing well, no sign of infection or swelling noted LUNGS:  Clear to auscultation bilaterally.  No wheezes or rhonchi. HEART:  Regular rate and rhythm. No murmur appreciated. ABDOMEN:  Soft, nontender.  Positive, normoactive bowel sounds. No organomegaly palpated. MSK:  No focal spinal tenderness to palpation. Full range of motion bilaterally in the upper extremities. EXTREMITIES:  No peripheral edema.   SKIN:  Clear with no obvious rashes or skin changes. No nail dyscrasia. NEURO:  Nonfocal. Well oriented.  Appropriate affect.     LAB RESULTS:  CMP     Component Value Date/Time   NA 141 10/06/2018 1008   K 3.9 10/06/2018 1008   CL 107 10/06/2018 1008   CO2 27 10/06/2018 1008   GLUCOSE 114 (H) 10/06/2018 1008   BUN 11 10/06/2018 1008   CREATININE 0.68 10/06/2018 1008   CALCIUM 9.2 10/06/2018 1008   PROT 7.4 10/06/2018 1008   ALBUMIN 4.0 10/06/2018 1008   AST 20 10/06/2018 1008   ALT 24 10/06/2018 1008   ALKPHOS 106 10/06/2018 1008   BILITOT 0.4 10/06/2018 1008   GFRNONAA >60 10/06/2018 1008   GFRAA >60 10/06/2018 1008     No results found for: TOTALPROTELP, ALBUMINELP, A1GS, A2GS, BETS, BETA2SER, GAMS, MSPIKE, SPEI  No results found for: KPAFRELGTCHN, LAMBDASER, KAPLAMBRATIO  Lab Results  Component Value Date   WBC 4.5 10/06/2018   NEUTROABS 2.3 10/06/2018   HGB 12.7 10/06/2018   HCT 42.0 10/06/2018   MCV 86.8 10/06/2018   PLT 275 10/06/2018    '@LASTCHEMISTRY'$ @  No results found for: LABCA2  No components found for: [id number]  No results for input(s): INR in the last 168 hours.  No results found for: LABCA2  No results found for: JJH417  No results found for: EYC144  No results found for: YJE563  No results found for: CA2729  No components found for: HGQUANT  No results found for: CEA1 / No results found for: CEA1   No results found for: AFPTUMOR  No results found for: CHROMOGRNA  No results found for: PSA1  No visits with results within 3 Day(s) from this visit.  Latest known visit with results is:  Hospital Outpatient Visit on 10/06/2018  Component Date Value Ref Range Status  . WBC 10/06/2018 4.5  4.0 - 10.5 K/uL Final  . RBC 10/06/2018 4.84  3.87 - 5.11 MIL/uL Final  . Hemoglobin 10/06/2018 12.7  12.0 - 15.0 g/dL Final  . HCT 10/06/2018 42.0  36.0 - 46.0 % Final  . MCV 10/06/2018 86.8  80.0 - 100.0 fL Final  . MCH 10/06/2018 26.2  26.0 - 34.0 pg Final  . MCHC 10/06/2018 30.2  30.0 - 36.0 g/dL Final  . RDW 10/06/2018 13.4  11.5 - 15.5 % Final  . Platelets 10/06/2018 275  150 - 400 K/uL Final  . nRBC 10/06/2018 0.0  0.0 - 0.2 % Final  . Neutrophils Relative % 10/06/2018 50  % Final  . Neutro Abs 10/06/2018 2.3  1.7 - 7.7 K/uL Final  . Lymphocytes Relative 10/06/2018 39  % Final  . Lymphs Abs 10/06/2018 1.7  0.7 - 4.0 K/uL Final  . Monocytes Relative 10/06/2018 8  % Final  . Monocytes Absolute 10/06/2018 0.4  0.1 - 1.0 K/uL Final  . Eosinophils Relative 10/06/2018 2  % Final  . Eosinophils Absolute 10/06/2018 0.1  0.0 - 0.5 K/uL Final  . Basophils Relative  10/06/2018 1  % Final  . Basophils Absolute 10/06/2018 0.0  0.0 - 0.1 K/uL Final  . Immature Granulocytes 10/06/2018 0  % Final  . Abs Immature Granulocytes 10/06/2018 0.01  0.00 - 0.07 K/uL Final   Performed at [facility], [place] [address] [place], [place] [address]  . Sodium 10/06/2018  141  135 - 145 mmol/L Final  . Potassium 10/06/2018 3.9  3.5 - 5.1 mmol/L Final  . Chloride 10/06/2018 107  98 - 111 mmol/L Final  . CO2 10/06/2018 27  22 - 32 mmol/L Final  . Glucose, Bld 10/06/2018 114* 70 - 99 mg/dL Final  . BUN 10/06/2018 11  6 - 20 mg/dL Final  . Creatinine, Ser 10/06/2018 0.68  0.44 - 1.00 mg/dL Final  . Calcium 10/06/2018 9.2  8.9 - 10.3 mg/dL Final  . Total Protein 10/06/2018 7.4  6.5 - 8.1 g/dL Final  . Albumin 10/06/2018 4.0  3.5 - 5.0 g/dL Final  . AST 10/06/2018 20  15 - 41 U/L Final  . ALT 10/06/2018 24  0 - 44 U/L Final  . Alkaline Phosphatase 10/06/2018 106  38 - 126 U/L Final  . Total Bilirubin 10/06/2018 0.4  0.3 - 1.2 mg/dL Final  . GFR calc non Af Amer 10/06/2018 >60  >60 mL/min Final  . GFR calc Af Amer 10/06/2018 >60  >60 mL/min Final  . Anion gap 10/06/2018 7  5 - 15 Final   Performed at [facility], [place] [address] [place], [place] [address]    (this displays the last labs from the last 3 days)  No results found for: TOTALPROTELP, ALBUMINELP, A1GS, A2GS, BETS, BETA2SER, GAMS, MSPIKE, SPEI (this displays SPEP labs)  No results found for: KPAFRELGTCHN, LAMBDASER, KAPLAMBRATIO (kappa/lambda light chains)  No results found for: HGBA, HGBA2QUANT, HGBFQUANT, HGBSQUAN (Hemoglobinopathy evaluation)   No results found for: LDH  No results found for: IRON, TIBC, IRONPCTSAT (Iron and TIBC)  No results found for: FERRITIN  Urinalysis    Component Value Date/Time   COLORURINE YELLOW 02/22/2015 0118   APPEARANCEUR CLOUDY (A) 02/22/2015 0118   LABSPEC 1.019 02/22/2015 0118   PHURINE 7.0 02/22/2015 0118   GLUCOSEU NEGATIVE 02/22/2015 0118   HGBUR  NEGATIVE 02/22/2015 0118   BILIRUBINUR NEGATIVE 02/22/2015 0118   KETONESUR NEGATIVE 02/22/2015 0118   PROTEINUR NEGATIVE 02/22/2015 0118   UROBILINOGEN 1.0 02/22/2015 0118   NITRITE NEGATIVE 02/22/2015 0118   LEUKOCYTESUR NEGATIVE 02/22/2015 0118     STUDIES:  Mr Breast Bilateral W Wo Contrast Inc Cad  Result Date: 10/01/2018 CLINICAL DATA:  Known left-sided breast cancer. LABS:  None provided EXAM: BILATERAL BREAST MRI WITH AND WITHOUT CONTRAST TECHNIQUE: Multiplanar, multisequence MR images of both breasts were obtained prior to and following the intravenous administration of 10 ml of Gadavist Three-dimensional MR images were rendered by post-processing of the original MR data on an independent workstation. The three-dimensional MR images were interpreted, and findings are reported in the following complete MRI report for this study. Three dimensional images were evaluated at the independent DynaCad workstation COMPARISON:  Previous exam(s). FINDINGS: Breast composition: b. Scattered fibroglandular tissue. Background parenchymal enhancement: Mild Right breast: No mass or abnormal enhancement. Left breast: The patient's known malignancy is identified in the upper outer quadrant of the left breast at a posterior depth. The biopsied mass measures 19 by 16 by 12 mm in AP, transverse, and craniocaudal dimension. There are several small satellite lesions anterior and posterior to the inferior aspect of the known malignancy as seen on series 6, image 75. The satellite lesions are nearly contiguous with the main mass. The maximum AP dimension counting the small satellite lesions is 4.2 cm. There is a small mass at a mid depth in the left breast, posterior to the nipple as seen on series 6, image 104 measuring 6.7 mm  in greatest diameter with washout kinetics. Multiple foci of enhancement are seen in both breasts but no other suspicious abnormalities. Lymph nodes: No abnormal appearing lymph nodes. Ancillary  findings:  None. IMPRESSION: 1. The patient's biopsied malignancy measures 19 x 16 x 12 mm as above. There are several small satellite lesions anterior and posterior to the inferior aspect of the main mass with a total AP dimension of 4.2 cm. 2. There is a 6.7 mm indeterminate mass in the left breast posterior to the nipple as seen on series 6, image 104 with washout kinetics. 3. No axillary adenopathy. RECOMMENDATION: 1. Recommend MRI guided biopsy of the 6.7 mm mass posterior to the nipple in the left breast. This mass is seen on series 6, image 104. BI-RADS CATEGORY  4: Suspicious. Electronically Signed   By: Dorise Bullion III M.D   On: 10/01/2018 10:25   Dg Chest Port 1 View  Result Date: 10/08/2018 CLINICAL DATA:  Port catheter placement. EXAM: PORTABLE CHEST 1 VIEW COMPARISON:  October 29, 2016 FINDINGS: The heart size and mediastinal contours are within normal limits. Right central venous line is identified with distal tip in the superior vena cava. There is no pneumothorax. Mild linear opacity of the medial left lung base is identified which may represent atelectasis. There is no focal pneumonia or pleural effusion. Skeletal structures are unremarkable. IMPRESSION: Right central venous line with distal tip in the superior vena cava. There is no pneumothorax. Probable linear atelectasis of medial left lung base. Electronically Signed   By: Abelardo Diesel M.D.   On: 10/08/2018 14:49   Dg Fluoro Guide Cv Line-no Report  Result Date: 10/08/2018 Fluoroscopy was utilized by the requesting physician.  No radiographic interpretation.   US Breast Ltd Uni Left Inc Axilla  Addendum Date: 09/24/2018   ADDENDUM REPORT: 09/24/2018 12:39 ADDENDUM: Correction: Comparison with prior study screening study on 09/23/2018 and multiple earlier exams Electronically Signed   By: Nolon Nations M.D.   On: 09/24/2018 12:39   Result Date: 09/24/2018 CLINICAL DATA:  Patient returns after screening study for evaluation of  a possible LEFT breast mass. EXAM: DIGITAL DIAGNOSTIC LEFT MAMMOGRAM WITH CAD AND TOMO ULTRASOUND LEFT  BREAST COMPARISON:  To 16073 and earlier ACR Breast Density Category b: There are scattered areas of fibroglandular density. FINDINGS: Additional 2-D and 3-D images are performed. These views confirm presence of an irregular mass with irregular margins in the UPPER-OUTER QUADRANT of the LEFT breast. Mammographic images were processed with CAD. On physical exam, I palpate focal thickening in the 2 o'clock location of the LEFT breast. Targeted ultrasound is performed, showing an irregular hypoechoic mass with internal vascularity in the 2 o'clock location of the LEFT breast 10 centimeters from the nipple. Mass measures 1.8 x 1.1 x 1.0 centimeters. There is associated posterior acoustic enhancement. Evaluation of the LEFT axilla is negative for adenopathy. IMPRESSION: Suspicious mass in the 2 o'clock location of the LEFT breast. Tissue diagnosis is recommended. RECOMMENDATION: Ultrasound-guided core biopsy of the LEFT breast. This will be performed later today. I have discussed the findings and recommendations with the patient. Results were also provided in writing at the conclusion of the visit. If applicable, a reminder letter will be sent to the patient regarding the next appointment. BI-RADS CATEGORY  5: Highly suggestive of malignancy. Electronically Signed: By: Nolon Nations M.D. On: 09/23/2018 15:00   Mm Diag Breast Tomo Uni Left  Addendum Date: 09/24/2018   ADDENDUM REPORT: 09/24/2018 12:39 ADDENDUM: Correction: Comparison with  prior study screening study on 09/23/2018 and multiple earlier exams Electronically Signed   By: Nolon Nations M.D.   On: 09/24/2018 12:39   Result Date: 09/24/2018 CLINICAL DATA:  Patient returns after screening study for evaluation of a possible LEFT breast mass. EXAM: DIGITAL DIAGNOSTIC LEFT MAMMOGRAM WITH CAD AND TOMO ULTRASOUND LEFT  BREAST COMPARISON:  To 884[address] and  earlier ACR Breast Density Category b: There are scattered areas of fibroglandular density. FINDINGS: Additional 2-D and 3-D images are performed. These views confirm presence of an irregular mass with irregular margins in the UPPER-OUTER QUADRANT of the LEFT breast. Mammographic images were processed with CAD. On physical exam, I palpate focal thickening in the 2 o'clock location of the LEFT breast. Targeted ultrasound is performed, showing an irregular hypoechoic mass with internal vascularity in the 2 o'clock location of the LEFT breast 10 centimeters from the nipple. Mass measures 1.8 x 1.1 x 1.0 centimeters. There is associated posterior acoustic enhancement. Evaluation of the LEFT axilla is negative for adenopathy. IMPRESSION: Suspicious mass in the 2 o'clock location of the LEFT breast. Tissue diagnosis is recommended. RECOMMENDATION: Ultrasound-guided core biopsy of the LEFT breast. This will be performed later today. I have discussed the findings and recommendations with the patient. Results were also provided in writing at the conclusion of the visit. If applicable, a reminder letter will be sent to the patient regarding the next appointment. BI-RADS CATEGORY  5: Highly suggestive of malignancy. Electronically Signed: By: Nolon Nations M.D. On: 09/23/2018 15:00   Mm 3d Screen Breast Bilateral  Result Date: 09/23/2018 CLINICAL DATA:  Screening. EXAM: DIGITAL SCREENING BILATERAL MAMMOGRAM WITH TOMO AND CAD COMPARISON:  Previous exam(s). ACR Breast Density Category b: There are scattered areas of fibroglandular density. FINDINGS: In the left breast, a possible mass warrants further evaluation. In the right breast, no findings suspicious for malignancy. Images were processed with CAD. IMPRESSION: Further evaluation is suggested for possible mass in the left breast. RECOMMENDATION: Diagnostic mammogram and possibly ultrasound of the left breast. (Code:[id number]) The patient will be contacted regarding the  findings, and additional imaging will be scheduled. BI-RADS CATEGORY  0: Incomplete. Need additional imaging evaluation and/or prior mammograms for comparison. Electronically Signed   By: Nolon Nations M.D.   On: 09/23/2018 14:33   Mm Clip Placement Left  Result Date: 10/12/2018 CLINICAL DATA:  Post biopsy mammogram the left breast for clip placement. EXAM: DIAGNOSTIC LEFT MAMMOGRAM POST MRI BIOPSY COMPARISON:  Previous exam(s). FINDINGS: Mammographic images were obtained following MRI guided biopsy of a mass in the medial left breast. The dumbbell-shaped biopsy marking clip is well positioned at the site of the biopsied mass in the medial left breast. IMPRESSION: Appropriate positioning of the dumbbell-shaped biopsy marking clip in the medial left breast. Final Assessment: Post Procedure Mammograms for Marker Placement Electronically Signed   By: Ammie Ferrier M.D.   On: 10/12/2018 09:35   Mm Clip Placement Left  Result Date: 09/23/2018 CLINICAL DATA:  Status post ultrasound-guided core biopsy of mass in the 2 o'clock location of the LEFT breast. EXAM: DIAGNOSTIC LEFT MAMMOGRAM POST ULTRASOUND BIOPSY COMPARISON:  Previous exam(s). FINDINGS: Mammographic images were obtained following ultrasound guided biopsy of mass in the 2 o'clock location of the LEFT breast. A ribbon shaped clip is identified in the mass in the UPPER-OUTER QUADRANT of the LEFT breast as expected following biopsy. IMPRESSION: Tissue marker clip in the expected location following biopsy. Final Assessment: Post Procedure Mammograms for Marker Placement Electronically Signed  By: Nolon Nations M.D.   On: 09/23/2018 15:36   [place] Lt Breast Bx W Loc Dev 1st Lesion Img Bx Spec US Guide  Addendum Date: 09/24/2018   ADDENDUM REPORT: 09/24/2018 12:43 ADDENDUM: PATHOLOGY ADDENDUM: Pathology LEFT breast 2 o'clock: Invasive ductal carcinoma, likely grade 3. Pathology concordance with imaging findings: Yes Recommendation: Consultation for  treatment plan. The patient is scheduled for the O clinic on 09/30/2018 At the request of the patient, I spoke with her by telephone on 09/24/2018 at 9:53 a.m. She reports doing well after the biopsy . Electronically Signed   By: Nolon Nations M.D.   On: 09/24/2018 12:43   Result Date: 09/24/2018 CLINICAL DATA:  Mass in the 2 o'clock location of the LEFT breast. EXAM: ULTRASOUND GUIDED LEFT BREAST CORE NEEDLE BIOPSY COMPARISON:  Previous exam(s). FINDINGS: I met with the patient and we discussed the procedure of ultrasound-guided biopsy, including benefits and alternatives. We discussed the high likelihood of a successful procedure. We discussed the risks of the procedure, including infection, bleeding, tissue injury, clip migration, and inadequate sampling. Informed written consent was given. The usual time-out protocol was performed immediately prior to the procedure. Lesion quadrant: Upper outer quadrant UPPER-OUTER QUADRANT LEFT breast Using sterile technique and 1% Lidocaine as local anesthetic, under direct ultrasound visualization, a 12 gauge spring-loaded device was used to perform biopsy of mass in the 2 o'clock location of the LEFT breast using a this LATERAL to MEDIAL approach. At the conclusion of the procedure a ribbon shaped tissue marker clip was deployed into the biopsy cavity. Follow up 2 view mammogram was performed and dictated separately. IMPRESSION: Ultrasound guided biopsy of LEFT breast mass. No apparent complications. Electronically Signed: By: Nolon Nations M.D. On: 09/23/2018 15:35   Mr Aundra Millet Breast Bx Johnella Moloney Dev 1st Lesion Image Bx Spec Mr Guide  Addendum Date: 10/13/2018   ADDENDUM REPORT: 10/13/2018 15:21 ADDENDUM: Pathology revealed DUCTAL PAPILLOMA of the Left breast, medial. This was found to be concordant by Dr. Ammie Ferrier, with excision recommended. Pathology results were discussed with the patient by telephone. The patient reported doing well after the biopsy with  tenderness at the site. Post biopsy instructions and care were reviewed and questions were answered. The patient was encouraged to call [facility] for any additional concerns. The patient has a recent diagnosis of left breast cancer and should follow her outlined treatment plan. Dr. [name] and Dr. Gunnar Bulla [name] were notified of biopsy results via [employer] message on October 13, 2018. Pathology results reported by Terie Purser, RN on 10/13/2018. Electronically Signed   By: Ammie Ferrier M.D.   On: 10/13/2018 15:21   Result Date: 10/13/2018 CLINICAL DATA:  58 year old female presenting for MRI guided biopsy of a left breast mass. EXAM: MRI GUIDED CORE NEEDLE BIOPSY OF THE LEFT BREAST TECHNIQUE: Multiplanar, multisequence MR imaging of the left breast was performed both before and after administration of intravenous contrast. CONTRAST:  10 mL of Gadavist. COMPARISON:  Previous exams. FINDINGS: I met with the patient, and we discussed the procedure of MRI guided biopsy, including risks, benefits, and alternatives. Specifically, we discussed the risks of infection, bleeding, tissue injury, clip migration, and inadequate sampling. Informed, written consent was given. The usual time out protocol was performed immediately prior to the procedure. Using sterile technique, 1% Lidocaine, MRI guidance, and a 9 gauge vacuum assisted device, biopsy was performed of a mass medial right breast using a lateral approach. At the conclusion of  the procedure, a dumbbell-shaped tissue marker clip was deployed into the biopsy cavity. Follow-up 2-view mammogram was performed and dictated separately. IMPRESSION: MRI guided biopsy of a medial left breast mass. No apparent complications. Electronically Signed: By: Ammie Ferrier M.D. On: 10/12/2018 09:32     ELIGIBLE FOR AVAILABLE RESEARCH PROTOCOL: UPBEAT   ASSESSMENT: 58 y.o. [name], [place] woman status post left breast upper outer quadrant biopsy  09/23/2018 for a clinical T1c N0, stage IA invasive ductal carcinoma, grade 3, triple positive, with an MIB-1 of 40%  (1) neoadjuvant chemotherapy will consist of carboplatin, docetaxel, and trastuzumab to start 10/22/2018, repeated every 21 days x 6  (a) echocardiogram on 10/07/2018 shows a LVEF of 60-65%.  (2) definitive surgery to follow  (3) adjuvant radiation  (4) antiestrogens to start at the completion of local treatment    PLAN: Lucill is doing well today.  She and I reviewed the risks and benefits of her chemotherapy and immunotherapy and she is agreeable to proceed.  She has her anti nausea medication and understands how to take the regimen after her chemotherapy.  She knows how to get in touch with [place] if she needs [place].    We reviewed her regimen and the need for her to see Dr. Haroldine Laws or Dr. Aundra Dubin with her echocardiograms every 3 months.  I placed a referral today.   We also reviewed her appointments, and I put in requests for her upcoming treatment room appointments since she has such a long treatment time and requires an early AM appointment.    Charlean will return on 3/111/2020 for labs, and on 3/12 for chemotherapy.  She knows to call with any problems that may develop before her next visit here.  A total of (20) minutes of face-to-face time was spent with this patient with greater than 50% of that time in counseling and care-coordination.    Wilber Bihari, NP  10/14/18 12:54 PM Medical Oncology and Hematology [facility] [address] [place],  [address] Tel. [phone]    Fax. [phone]    .

## 2018-10-14 NOTE — Telephone Encounter (Signed)
Gave patient avs report and appointments for march thru May. Per 3/4 los appointments requested from March thru July. Appointments were scheduled as requested for March thru May. Additional appointments can be requested/scheduled at April/May f/u visit.

## 2018-10-21 ENCOUNTER — Other Ambulatory Visit: Payer: Self-pay

## 2018-10-21 ENCOUNTER — Inpatient Hospital Stay: Payer: 59

## 2018-10-21 DIAGNOSIS — Z780 Asymptomatic menopausal state: Secondary | ICD-10-CM | POA: Diagnosis not present

## 2018-10-21 DIAGNOSIS — Z5112 Encounter for antineoplastic immunotherapy: Secondary | ICD-10-CM | POA: Diagnosis not present

## 2018-10-21 DIAGNOSIS — Z5111 Encounter for antineoplastic chemotherapy: Secondary | ICD-10-CM | POA: Diagnosis not present

## 2018-10-21 DIAGNOSIS — Z170 Estrogen receptor positive status [ER+]: Secondary | ICD-10-CM | POA: Diagnosis not present

## 2018-10-21 DIAGNOSIS — R197 Diarrhea, unspecified: Secondary | ICD-10-CM | POA: Diagnosis not present

## 2018-10-21 DIAGNOSIS — K219 Gastro-esophageal reflux disease without esophagitis: Secondary | ICD-10-CM | POA: Diagnosis not present

## 2018-10-21 DIAGNOSIS — C50412 Malignant neoplasm of upper-outer quadrant of left female breast: Secondary | ICD-10-CM

## 2018-10-21 DIAGNOSIS — Z800 Family history of malignant neoplasm of digestive organs: Secondary | ICD-10-CM | POA: Diagnosis not present

## 2018-10-21 DIAGNOSIS — Z79899 Other long term (current) drug therapy: Secondary | ICD-10-CM | POA: Diagnosis not present

## 2018-10-21 LAB — CBC WITH DIFFERENTIAL (CANCER CENTER ONLY)
Abs Immature Granulocytes: 0.02 10*3/uL (ref 0.00–0.07)
Basophils Absolute: 0 10*3/uL (ref 0.0–0.1)
Basophils Relative: 1 %
Eosinophils Absolute: 0.2 10*3/uL (ref 0.0–0.5)
Eosinophils Relative: 4 %
HCT: 40 % (ref 36.0–46.0)
Hemoglobin: 12.2 g/dL (ref 12.0–15.0)
IMMATURE GRANULOCYTES: 0 %
Lymphocytes Relative: 33 %
Lymphs Abs: 1.6 10*3/uL (ref 0.7–4.0)
MCH: 26.3 pg (ref 26.0–34.0)
MCHC: 30.5 g/dL (ref 30.0–36.0)
MCV: 86.4 fL (ref 80.0–100.0)
MONOS PCT: 9 %
Monocytes Absolute: 0.5 10*3/uL (ref 0.1–1.0)
Neutro Abs: 2.6 10*3/uL (ref 1.7–7.7)
Neutrophils Relative %: 53 %
Platelet Count: 288 10*3/uL (ref 150–400)
RBC: 4.63 MIL/uL (ref 3.87–5.11)
RDW: 13.6 % (ref 11.5–15.5)
WBC Count: 4.9 10*3/uL (ref 4.0–10.5)
nRBC: 0 % (ref 0.0–0.2)

## 2018-10-21 LAB — CMP (CANCER CENTER ONLY)
ALT: 42 U/L (ref 0–44)
AST: 22 U/L (ref 15–41)
Albumin: 3.8 g/dL (ref 3.5–5.0)
Alkaline Phosphatase: 139 U/L — ABNORMAL HIGH (ref 38–126)
Anion gap: 10 (ref 5–15)
BUN: 10 mg/dL (ref 6–20)
CO2: 23 mmol/L (ref 22–32)
Calcium: 9.1 mg/dL (ref 8.9–10.3)
Chloride: 107 mmol/L (ref 98–111)
Creatinine: 0.8 mg/dL (ref 0.44–1.00)
GFR, Est AFR Am: >60 mL/min (ref >60)
GFR, Estimated: >60 mL/min (ref >60)
GLUCOSE: 108 mg/dL — AB (ref 70–99)
Potassium: 4 mmol/L (ref 3.5–5.1)
Sodium: 140 mmol/L (ref 135–145)
Total Bilirubin: 0.4 mg/dL (ref 0.3–1.2)
Total Protein: 7.7 g/dL (ref 6.5–8.1)

## 2018-10-22 ENCOUNTER — Inpatient Hospital Stay: Payer: 59

## 2018-10-22 VITALS — BP 119/77 | HR 78 | Temp 98.1°F | Resp 16

## 2018-10-22 DIAGNOSIS — K219 Gastro-esophageal reflux disease without esophagitis: Secondary | ICD-10-CM | POA: Diagnosis not present

## 2018-10-22 DIAGNOSIS — Z170 Estrogen receptor positive status [ER+]: Principal | ICD-10-CM

## 2018-10-22 DIAGNOSIS — Z79899 Other long term (current) drug therapy: Secondary | ICD-10-CM | POA: Diagnosis not present

## 2018-10-22 DIAGNOSIS — Z5112 Encounter for antineoplastic immunotherapy: Secondary | ICD-10-CM | POA: Diagnosis not present

## 2018-10-22 DIAGNOSIS — C50412 Malignant neoplasm of upper-outer quadrant of left female breast: Secondary | ICD-10-CM

## 2018-10-22 DIAGNOSIS — R197 Diarrhea, unspecified: Secondary | ICD-10-CM | POA: Diagnosis not present

## 2018-10-22 DIAGNOSIS — Z780 Asymptomatic menopausal state: Secondary | ICD-10-CM | POA: Diagnosis not present

## 2018-10-22 DIAGNOSIS — Z5111 Encounter for antineoplastic chemotherapy: Secondary | ICD-10-CM | POA: Diagnosis not present

## 2018-10-22 DIAGNOSIS — Z800 Family history of malignant neoplasm of digestive organs: Secondary | ICD-10-CM | POA: Diagnosis not present

## 2018-10-22 MED ORDER — ACETAMINOPHEN 325 MG PO TABS
ORAL_TABLET | ORAL | Status: AC
  Filled 2018-10-22: qty 2

## 2018-10-22 MED ORDER — SODIUM CHLORIDE 0.9 % IV SOLN
575.0000 mg | Freq: Once | INTRAVENOUS | Status: AC
  Administered 2018-10-22: 580 mg via INTRAVENOUS
  Filled 2018-10-22: qty 58

## 2018-10-22 MED ORDER — PEGFILGRASTIM 6 MG/0.6ML ~~LOC~~ PSKT
6.0000 mg | PREFILLED_SYRINGE | Freq: Once | SUBCUTANEOUS | Status: AC
  Administered 2018-10-22: 6 mg via SUBCUTANEOUS

## 2018-10-22 MED ORDER — PALONOSETRON HCL INJECTION 0.25 MG/5ML
INTRAVENOUS | Status: AC
  Filled 2018-10-22: qty 5

## 2018-10-22 MED ORDER — PEGFILGRASTIM 6 MG/0.6ML ~~LOC~~ PSKT
PREFILLED_SYRINGE | SUBCUTANEOUS | Status: AC
  Filled 2018-10-22: qty 0.6

## 2018-10-22 MED ORDER — DIPHENHYDRAMINE HCL 25 MG PO CAPS
ORAL_CAPSULE | ORAL | Status: AC
  Filled 2018-10-22: qty 1

## 2018-10-22 MED ORDER — SODIUM CHLORIDE 0.9 % IV SOLN
75.0000 mg/m2 | Freq: Once | INTRAVENOUS | Status: AC
  Administered 2018-10-22: 150 mg via INTRAVENOUS
  Filled 2018-10-22: qty 15

## 2018-10-22 MED ORDER — ACETAMINOPHEN 325 MG PO TABS
650.0000 mg | ORAL_TABLET | Freq: Once | ORAL | Status: AC
  Administered 2018-10-22: 650 mg via ORAL

## 2018-10-22 MED ORDER — SODIUM CHLORIDE 0.9% FLUSH
10.0000 mL | INTRAVENOUS | Status: DC | PRN
  Administered 2018-10-22: 10 mL
  Filled 2018-10-22: qty 10

## 2018-10-22 MED ORDER — HEPARIN SOD (PORK) LOCK FLUSH 100 UNIT/ML IV SOLN
500.0000 [IU] | Freq: Once | INTRAVENOUS | Status: AC | PRN
  Administered 2018-10-22: 500 [IU]
  Filled 2018-10-22: qty 5

## 2018-10-22 MED ORDER — DIPHENHYDRAMINE HCL 25 MG PO CAPS
25.0000 mg | ORAL_CAPSULE | Freq: Once | ORAL | Status: AC
  Administered 2018-10-22: 25 mg via ORAL

## 2018-10-22 MED ORDER — TRASTUZUMAB CHEMO 150 MG IV SOLR
750.0000 mg | Freq: Once | INTRAVENOUS | Status: AC
  Administered 2018-10-22: 750 mg via INTRAVENOUS
  Filled 2018-10-22: qty 35.72

## 2018-10-22 MED ORDER — PALONOSETRON HCL INJECTION 0.25 MG/5ML
0.2500 mg | Freq: Once | INTRAVENOUS | Status: AC
  Administered 2018-10-22: 0.25 mg via INTRAVENOUS

## 2018-10-22 MED ORDER — SODIUM CHLORIDE 0.9 % IV SOLN
Freq: Once | INTRAVENOUS | Status: AC
  Administered 2018-10-22: 10:00:00 via INTRAVENOUS
  Filled 2018-10-22: qty 250

## 2018-10-22 MED ORDER — SODIUM CHLORIDE 0.9 % IV SOLN
Freq: Once | INTRAVENOUS | Status: AC
  Administered 2018-10-22: 13:00:00 via INTRAVENOUS
  Filled 2018-10-22: qty 5

## 2018-10-22 NOTE — Patient Instructions (Signed)
[facility] Discharge Instructions for Patients Receiving Chemotherapy  Today you received the following chemotherapy agents:  Herceptin, Taxotere, and Carboplatin.  To help prevent nausea and vomiting after your treatment, we encourage you to take your nausea medication as directed.   If you develop nausea and vomiting that is not controlled by your nausea medication, call the clinic.   BELOW ARE SYMPTOMS THAT SHOULD BE REPORTED IMMEDIATELY:  *FEVER GREATER THAN 100.5 F  *CHILLS WITH OR WITHOUT FEVER  NAUSEA AND VOMITING THAT IS NOT CONTROLLED WITH YOUR NAUSEA MEDICATION  *UNUSUAL SHORTNESS OF BREATH  *UNUSUAL BRUISING OR BLEEDING  TENDERNESS IN MOUTH AND THROAT WITH OR WITHOUT PRESENCE OF ULCERS  *URINARY PROBLEMS  *BOWEL PROBLEMS  UNUSUAL RASH Items with * indicate a potential emergency and should be followed up as soon as possible.  Feel free to call the clinic should you have any questions or concerns. The clinic phone number is [phone].  Please show the [place] at check-in to the Emergency Department and triage nurse.  Trastuzumab injection for infusion What is this medicine? TRASTUZUMAB (tras TOO zoo mab) is a monoclonal antibody. It is used to treat breast cancer and stomach cancer. This medicine may be used for other purposes; ask your health care provider or pharmacist if you have questions. COMMON BRAND NAME(S): Herceptin, Calla Kicks, OGIVRI What should I tell my health care provider before I take this medicine? They need to know if you have any of these conditions: -heart disease -heart failure -lung or breathing disease, like asthma -an unusual or allergic reaction to trastuzumab, benzyl alcohol, or other medications, foods, dyes, or preservatives -pregnant or trying to get pregnant -breast-feeding How should I use this medicine? This drug is given as an infusion into a vein. It is administered in a hospital or clinic by a  specially trained health care professional. Talk to your pediatrician regarding the use of this medicine in children. This medicine is not approved for use in children. Overdosage: If you think you have taken too much of this medicine contact a poison control center or emergency room at once. NOTE: This medicine is only for you. Do not share this medicine with others. What if I miss a dose? It is important not to miss a dose. Call your doctor or health care professional if you are unable to keep an appointment. What may interact with this medicine? This medicine may interact with the following medications: -certain types of chemotherapy, such as daunorubicin, doxorubicin, epirubicin, and idarubicin This list may not describe all possible interactions. Give your health care provider a list of all the medicines, herbs, non-prescription drugs, or dietary supplements you use. Also tell them if you smoke, drink alcohol, or use illegal drugs. Some items may interact with your medicine. What should I watch for while using this medicine? Visit your doctor for checks on your progress. Report any side effects. Continue your course of treatment even though you feel ill unless your doctor tells you to stop. Call your doctor or health care professional for advice if you get a fever, chills or sore throat, or other symptoms of a cold or flu. Do not treat yourself. Try to avoid being around people who are sick. You may experience fever, chills and shaking during your first infusion. These effects are usually mild and can be treated with other medicines. Report any side effects during the infusion to your health care professional. Fever and chills usually do not happen with later  infusions. Do not become pregnant while taking this medicine or for 7 months after stopping it. Women should inform their doctor if they wish to become pregnant or think they might be pregnant. Women of child-bearing potential will need to  have a negative pregnancy test before starting this medicine. There is a potential for serious side effects to an unborn child. Talk to your health care professional or pharmacist for more information. Do not breast-feed an infant while taking this medicine or for 7 months after stopping it. Women must use effective birth control with this medicine. What side effects may I notice from receiving this medicine? Side effects that you should report to your doctor or health care professional as soon as possible: -allergic reactions like skin rash, itching or hives, swelling of the face, lips, or tongue -chest pain or palpitations -cough -dizziness -feeling faint or lightheaded, falls -fever -general ill feeling or flu-like symptoms -signs of worsening heart failure like breathing problems; swelling in your legs and feet -unusually weak or tired Side effects that usually do not require medical attention (report to your doctor or health care professional if they continue or are bothersome): -bone pain -changes in taste -diarrhea -joint pain -nausea/vomiting -weight loss This list may not describe all possible side effects. Call your doctor for medical advice about side effects. You may report side effects to FDA at 1-800-FDA-1088. Where should I keep my medicine? This drug is given in a hospital or clinic and will not be stored at home. NOTE: This sheet is a summary. It may not cover all possible information. If you have questions about this medicine, talk to your doctor, pharmacist, or health care provider.  2019 Elsevier/Gold Standard (2016-07-23 14:37:52)  Docetaxel injection What is this medicine? DOCETAXEL (doe se TAX el) is a chemotherapy drug. It targets fast dividing cells, like cancer cells, and causes these cells to die. This medicine is used to treat many types of cancers like breast cancer, certain stomach cancers, head and neck cancer, lung cancer, and prostate cancer. This  medicine may be used for other purposes; ask your health care provider or pharmacist if you have questions. COMMON BRAND NAME(S): Docefrez, Taxotere What should I tell my health care provider before I take this medicine? They need to know if you have any of these conditions: -infection (especially a virus infection such as chickenpox, cold sores, or herpes) -liver disease -low blood counts, like low white cell, platelet, or red cell counts -an unusual or allergic reaction to docetaxel, polysorbate 80, other chemotherapy agents, other medicines, foods, dyes, or preservatives -pregnant or trying to get pregnant -breast-feeding How should I use this medicine? This drug is given as an infusion into a vein. It is administered in a hospital or clinic by a specially trained health care professional. Talk to your pediatrician regarding the use of this medicine in children. Special care may be needed. Overdosage: If you think you have taken too much of this medicine contact a poison control center or emergency room at once. NOTE: This medicine is only for you. Do not share this medicine with others. What if I miss a dose? It is important not to miss your dose. Call your doctor or health care professional if you are unable to keep an appointment. What may interact with this medicine? -cyclosporine -erythromycin -ketoconazole -medicines to increase blood counts like filgrastim, pegfilgrastim, sargramostim -vaccines Talk to your doctor or health care professional before taking any of these medicines: -acetaminophen -aspirin -ibuprofen -ketoprofen -naproxen  This list may not describe all possible interactions. Give your health care provider a list of all the medicines, herbs, non-prescription drugs, or dietary supplements you use. Also tell them if you smoke, drink alcohol, or use illegal drugs. Some items may interact with your medicine. What should I watch for while using this medicine? Your  condition will be monitored carefully while you are receiving this medicine. You will need important blood work done while you are taking this medicine. This drug may make you feel generally unwell. This is not uncommon, as chemotherapy can affect healthy cells as well as cancer cells. Report any side effects. Continue your course of treatment even though you feel ill unless your doctor tells you to stop. In some cases, you may be given additional medicines to help with side effects. Follow all directions for their use. Call your doctor or health care professional for advice if you get a fever, chills or sore throat, or other symptoms of a cold or flu. Do not treat yourself. This drug decreases your body's ability to fight infections. Try to avoid being around people who are sick. This medicine may increase your risk to bruise or bleed. Call your doctor or health care professional if you notice any unusual bleeding. This medicine may contain alcohol in the product. You may get drowsy or dizzy. Do not drive, use machinery, or do anything that needs mental alertness until you know how this medicine affects you. Do not stand or sit up quickly, especially if you are an older patient. This reduces the risk of dizzy or fainting spells. Avoid alcoholic drinks. Do not become pregnant while taking this medicine or for 6 months after stopping it. Women should inform their doctor if they wish to become pregnant or think they might be pregnant. Men should not father a child while taking this medicine and for 3 months after stopping it. There is a potential for serious side effects to an unborn child. Talk to your health care professional or pharmacist for more information. Do not breast-feed an infant while taking this medicine or for 2 weeks after stopping it. This may interfere with the ability to father a child. You should talk to your doctor or health care professional if you are concerned about your fertility. What  side effects may I notice from receiving this medicine? Side effects that you should report to your doctor or health care professional as soon as possible: -allergic reactions like skin rash, itching or hives, swelling of the face, lips, or tongue -low blood counts - This drug may decrease the number of white blood cells, red blood cells and platelets. You may be at increased risk for infections and bleeding. -signs of infection - fever or chills, cough, sore throat, pain or difficulty passing urine -signs of decreased platelets or bleeding - bruising, pinpoint red spots on the skin, black, tarry stools, nosebleeds -signs of decreased red blood cells - unusually weak or tired, fainting spells, lightheadedness -breathing problems -fast or irregular heartbeat -low blood pressure -mouth sores -nausea and vomiting -pain, swelling, redness or irritation at the injection site -pain, tingling, numbness in the hands or feet -swelling of the ankle, feet, hands -weight gain Side effects that usually do not require medical attention (report to your doctor or health care professional if they continue or are bothersome): -bone pain -complete hair loss including hair on your head, underarms, pubic hair, eyebrows, and eyelashes -diarrhea -excessive tearing -changes in the color of fingernails -loosening of the  fingernails -nausea -muscle pain -red flush to skin -sweating -weak or tired This list may not describe all possible side effects. Call your doctor for medical advice about side effects. You may report side effects to FDA at 1-800-FDA-1088. Where should I keep my medicine? This drug is given in a hospital or clinic and will not be stored at home. NOTE: This sheet is a summary. It may not cover all possible information. If you have questions about this medicine, talk to your doctor, pharmacist, or health care provider.  2019 Elsevier/Gold Standard (2017-08-25 12:07:21)  Carboplatin  injection What is this medicine? CARBOPLATIN (KAR boe pla tin) is a chemotherapy drug. It targets fast dividing cells, like cancer cells, and causes these cells to die. This medicine is used to treat ovarian cancer and many other cancers. This medicine may be used for other purposes; ask your health care provider or pharmacist if you have questions. COMMON BRAND NAME(S): Paraplatin What should I tell my health care provider before I take this medicine? They need to know if you have any of these conditions: -blood disorders -hearing problems -kidney disease -recent or ongoing radiation therapy -an unusual or allergic reaction to carboplatin, cisplatin, other chemotherapy, other medicines, foods, dyes, or preservatives -pregnant or trying to get pregnant -breast-feeding How should I use this medicine? This drug is usually given as an infusion into a vein. It is administered in a hospital or clinic by a specially trained health care professional. Talk to your pediatrician regarding the use of this medicine in children. Special care may be needed. Overdosage: If you think you have taken too much of this medicine contact a poison control center or emergency room at once. NOTE: This medicine is only for you. Do not share this medicine with others. What if I miss a dose? It is important not to miss a dose. Call your doctor or health care professional if you are unable to keep an appointment. What may interact with this medicine? -medicines for seizures -medicines to increase blood counts like filgrastim, pegfilgrastim, sargramostim -some antibiotics like amikacin, gentamicin, neomycin, streptomycin, tobramycin -vaccines Talk to your doctor or health care professional before taking any of these medicines: -acetaminophen -aspirin -ibuprofen -ketoprofen -naproxen This list may not describe all possible interactions. Give your health care provider a list of all the medicines, herbs,  non-prescription drugs, or dietary supplements you use. Also tell them if you smoke, drink alcohol, or use illegal drugs. Some items may interact with your medicine. What should I watch for while using this medicine? Your condition will be monitored carefully while you are receiving this medicine. You will need important blood work done while you are taking this medicine. This drug may make you feel generally unwell. This is not uncommon, as chemotherapy can affect healthy cells as well as cancer cells. Report any side effects. Continue your course of treatment even though you feel ill unless your doctor tells you to stop. In some cases, you may be given additional medicines to help with side effects. Follow all directions for their use. Call your doctor or health care professional for advice if you get a fever, chills or sore throat, or other symptoms of a cold or flu. Do not treat yourself. This drug decreases your body's ability to fight infections. Try to avoid being around people who are sick. This medicine may increase your risk to bruise or bleed. Call your doctor or health care professional if you notice any unusual bleeding. Be careful brushing and  flossing your teeth or using a toothpick because you may get an infection or bleed more easily. If you have any dental work done, tell your dentist you are receiving this medicine. Avoid taking products that contain aspirin, acetaminophen, ibuprofen, naproxen, or ketoprofen unless instructed by your doctor. These medicines may hide a fever. Do not become pregnant while taking this medicine. Women should inform their doctor if they wish to become pregnant or think they might be pregnant. There is a potential for serious side effects to an unborn child. Talk to your health care professional or pharmacist for more information. Do not breast-feed an infant while taking this medicine. What side effects may I notice from receiving this medicine? Side effects  that you should report to your doctor or health care professional as soon as possible: -allergic reactions like skin rash, itching or hives, swelling of the face, lips, or tongue -signs of infection - fever or chills, cough, sore throat, pain or difficulty passing urine -signs of decreased platelets or bleeding - bruising, pinpoint red spots on the skin, black, tarry stools, nosebleeds -signs of decreased red blood cells - unusually weak or tired, fainting spells, lightheadedness -breathing problems -changes in hearing -changes in vision -chest pain -high blood pressure -low blood counts - This drug may decrease the number of white blood cells, red blood cells and platelets. You may be at increased risk for infections and bleeding. -nausea and vomiting -pain, swelling, redness or irritation at the injection site -pain, tingling, numbness in the hands or feet -problems with balance, talking, walking -trouble passing urine or change in the amount of urine Side effects that usually do not require medical attention (report to your doctor or health care professional if they continue or are bothersome): -hair loss -loss of appetite -metallic taste in the mouth or changes in taste This list may not describe all possible side effects. Call your doctor for medical advice about side effects. You may report side effects to FDA at 1-800-FDA-1088. Where should I keep my medicine? This drug is given in a hospital or clinic and will not be stored at home. NOTE: This sheet is a summary. It may not cover all possible information. If you have questions about this medicine, talk to your doctor, pharmacist, or health care provider.  2019 Elsevier/Gold Standard (2007-11-03 14:38:05)

## 2018-10-22 NOTE — Progress Notes (Signed)
DCP-001  Met with patient and her husband for approximately 10 minutes. Pt interested in hearing about DCP-001 per previous discussion. The consent and authorization forms were with patient in their entirety and patient is aware that this involves one-time consent and the collection of demographic variables. Pt is also aware that no patient identifiers are being reported via the screening tool. When all of her questions were answered to her satisfaction, the patient signed and dated the consent and authorization forms. Copies of the signed documents were given to the patient. Patient meets eligibility and will be enrolled in the DCP-001 study. Patient denied any questions or concerns. Pt was thanked for her time and participation. Johney Maine RN, BSN Clinical Research  10/22/18 11:07 AM

## 2018-10-22 NOTE — Progress Notes (Signed)
MD would like Carbo dose = 580 mg as ordered; he used CrCl = 90 mL/min. MD will adjust back up toward 700 mg if she tolerates. Kennith Center, Pharm.D., CPP 10/22/2018@10 :09 AM

## 2018-10-24 ENCOUNTER — Inpatient Hospital Stay: Payer: 59

## 2018-10-24 NOTE — Progress Notes (Signed)
Pt stopped by stating she had taken 8mg  total of decadron on 10/23/18.  Pt states she had talked to poison control and they recommeded she followup with her physician.  Dr. Walden Field (oncall physician) notified and asked that the patient hold her dose of decadron for today (10/24/18) and start back up with her regular dose of decadron on 10/25/18. Instructions relayed to patient, and pt verbalized understanding.

## 2018-10-27 ENCOUNTER — Other Ambulatory Visit: Payer: Self-pay | Admitting: *Deleted

## 2018-10-27 DIAGNOSIS — Z170 Estrogen receptor positive status [ER+]: Principal | ICD-10-CM

## 2018-10-27 DIAGNOSIS — C50412 Malignant neoplasm of upper-outer quadrant of left female breast: Secondary | ICD-10-CM

## 2018-10-28 ENCOUNTER — Telehealth: Payer: Self-pay | Admitting: *Deleted

## 2018-10-28 MED ORDER — FLUCONAZOLE 100 MG PO TABS: 100.0000 mg | ORAL_TABLET | Freq: Every day | ORAL | 1 refills | Status: DC

## 2018-10-28 MED FILL — FLUCONAZOLE 100 MG TABLET: 100 | 21 days supply | Qty: 21 | Fill #0 | Status: TO

## 2018-10-28 NOTE — Telephone Encounter (Signed)
This RN spoke with pt per her call stating onset of rash with itching " on the skin outside of my vagina and between my upper thighs ".  Natasha Huffman denies any history of vaginal rashes including denial of herpes simplex history.  Rash is itchy - but no noted discharge.  Per above and review with MD - order sent for diflucan to verified pharmacy.

## 2018-10-28 NOTE — Telephone Encounter (Signed)
Called Natasha Huffman for chemotherapy F/U.  Patient is doing well.  Denies n/v.  Denies any new side effects or symptoms beyond yeast infection.  Bowel and bladder functioning well.  Eating and drinking well.  Instructed to drink 64 oz minimum daily or at least the day before, of and after treatment.  Reports pain as stomach ache last two nights.  Ate full meal ten minutes before lying down.  Advised to eat and drink at least two hours before lying down.  Denies further questions or needs at this time.  Encouraged to call [phone] Mon -Fri 8:00 am - 4:30 pm or after hours to reach [place] after hours for changes or event.

## 2018-10-28 NOTE — Telephone Encounter (Signed)
-----   Message from Sinda Du, RN sent at 10/22/2018  5:03 PM EDT ----- Regarding: Dr. Jana Hakim - 1st chemo f/u 1st chemo f/u

## 2018-10-29 ENCOUNTER — Inpatient Hospital Stay: Payer: 59

## 2018-10-29 ENCOUNTER — Encounter: Payer: Self-pay | Admitting: Adult Health

## 2018-10-29 ENCOUNTER — Other Ambulatory Visit: Payer: Self-pay

## 2018-10-29 ENCOUNTER — Inpatient Hospital Stay (HOSPITAL_BASED_OUTPATIENT_CLINIC_OR_DEPARTMENT_OTHER): Payer: 59 | Admitting: Adult Health

## 2018-10-29 VITALS — BP 127/77 | HR 101 | Temp 99.2°F | Resp 18 | Ht 62.0 in | Wt 202.3 lb

## 2018-10-29 DIAGNOSIS — Z800 Family history of malignant neoplasm of digestive organs: Secondary | ICD-10-CM

## 2018-10-29 DIAGNOSIS — Z170 Estrogen receptor positive status [ER+]: Secondary | ICD-10-CM | POA: Diagnosis not present

## 2018-10-29 DIAGNOSIS — C50412 Malignant neoplasm of upper-outer quadrant of left female breast: Secondary | ICD-10-CM | POA: Diagnosis not present

## 2018-10-29 DIAGNOSIS — K219 Gastro-esophageal reflux disease without esophagitis: Secondary | ICD-10-CM

## 2018-10-29 DIAGNOSIS — Z79899 Other long term (current) drug therapy: Secondary | ICD-10-CM

## 2018-10-29 DIAGNOSIS — Z5112 Encounter for antineoplastic immunotherapy: Secondary | ICD-10-CM | POA: Diagnosis not present

## 2018-10-29 DIAGNOSIS — Z5111 Encounter for antineoplastic chemotherapy: Secondary | ICD-10-CM | POA: Diagnosis not present

## 2018-10-29 DIAGNOSIS — R197 Diarrhea, unspecified: Secondary | ICD-10-CM | POA: Diagnosis not present

## 2018-10-29 DIAGNOSIS — Z780 Asymptomatic menopausal state: Secondary | ICD-10-CM

## 2018-10-29 LAB — CMP (CANCER CENTER ONLY)
ALT: 51 U/L — ABNORMAL HIGH (ref 0–44)
AST: 25 U/L (ref 15–41)
Albumin: 3.7 g/dL (ref 3.5–5.0)
Alkaline Phosphatase: 107 U/L (ref 38–126)
Anion gap: 10 (ref 5–15)
BUN: 11 mg/dL (ref 6–20)
CO2: 25 mmol/L (ref 22–32)
Calcium: 8.7 mg/dL — ABNORMAL LOW (ref 8.9–10.3)
Chloride: 104 mmol/L (ref 98–111)
Creatinine: 0.82 mg/dL (ref 0.44–1.00)
GFR, Est AFR Am: >60 mL/min (ref >60)
Glucose, Bld: 109 mg/dL — ABNORMAL HIGH (ref 70–99)
Potassium: 4.2 mmol/L (ref 3.5–5.1)
Sodium: 139 mmol/L (ref 135–145)
Total Bilirubin: 0.6 mg/dL (ref 0.3–1.2)
Total Protein: 7.1 g/dL (ref 6.5–8.1)

## 2018-10-29 LAB — CBC WITH DIFFERENTIAL (CANCER CENTER ONLY)
Abs Immature Granulocytes: 0 10*3/uL (ref 0.00–0.07)
Basophils Absolute: 0 10*3/uL (ref 0.0–0.1)
Basophils Relative: 1 %
EOS PCT: 2 %
Eosinophils Absolute: 0 10*3/uL (ref 0.0–0.5)
HCT: 39.3 % (ref 36.0–46.0)
Hemoglobin: 12.4 g/dL (ref 12.0–15.0)
Immature Granulocytes: 0 %
Lymphocytes Relative: 67 %
Lymphs Abs: 1.2 10*3/uL (ref 0.7–4.0)
MCH: 26.6 pg (ref 26.0–34.0)
MCHC: 31.6 g/dL (ref 30.0–36.0)
MCV: 84.3 fL (ref 80.0–100.0)
Monocytes Absolute: 0.1 10*3/uL (ref 0.1–1.0)
Monocytes Relative: 3 %
Neutro Abs: 0.5 10*3/uL — ABNORMAL LOW (ref 1.7–7.7)
Neutrophils Relative %: 27 %
PLATELETS: 216 10*3/uL (ref 150–400)
RBC: 4.66 MIL/uL (ref 3.87–5.11)
RDW: 12.9 % (ref 11.5–15.5)
WBC Count: 1.8 10*3/uL — ABNORMAL LOW (ref 4.0–10.5)
nRBC: 0 % (ref 0.0–0.2)

## 2018-10-29 MED ORDER — CIPROFLOXACIN HCL 500 MG PO TABS: 500.0000 mg | ORAL_TABLET | Freq: Two times a day (BID) | ORAL | 0 refills | Status: DC

## 2018-10-29 MED FILL — CIPROFLOXACIN HCL 500 MG TA: 500 | 7 days supply | Qty: 14 | Fill #0

## 2018-10-29 NOTE — Progress Notes (Signed)
[place]  Telephone:([phone]) [phone] Fax:(336) [phone]    ID: Natasha Huffman DOB: 11-Mar-1961  MR#: [id number]  JYN#:[id number]  Patient Care Team: Natasha Arabian, MD as PCP - General (Family Medicine) Rockwell Germany, RN as Oncology Nurse Navigator Mauro Kaufmann, RN as Oncology Nurse Navigator [name], MD as Consulting Physician (General Surgery) [name], [name], MD as Consulting Physician (Oncology) Kyung Rudd, MD as Consulting Physician (Radiation Oncology) OTHER MD:    CHIEF COMPLAINT: Triple positive breast cancer  CURRENT TREATMENT: Neoadjuvant chemotherapy   HISTORY OF CURRENT ILLNESS: Natasha Huffman had routine screening mammography on 09/23/2018 showing a possible abnormality in the left breast. She underwent unilateral left diagnostic mammography with tomography and left breast ultrasonography at [facility] on 09/23/2018 showing: Breast Density Category B. There is a mass with irregular margins in the upper-outer quadrant of the left breast. On physical exam, there is palpable focal thickening in the 2 o'clock location of the left breast. Sonographically, an irregular hypoechoic mass with internal vascularity in the 2 o'clock location of the left breast 10 cm from the nipple is seen. The mass measures 1.8 x 1.1 x 1.0 cm. There is associated posterior acoustic enhancement. Evaluation of the left axilla is negative for adenopathy.  Accordingly on 09/23/2018 she proceeded to biopsy of the left breast area in question. The pathology from this procedure showed ([id number]): invasive ductal carcinoma, grade III. Prognostic indicators significant for: estrogen receptor, 100% positive and progesterone receptor, 2% positive, both with strong staining intensity. Proliferation marker Ki67 at 40%. HER2 positive (3+) by immunohistochemistry.   The patient's subsequent history is as detailed below.   INTERVAL HISTORY: Natasha Huffman is here today for f/u and treatment  of her HER-2 positive breast cancer.  She is receiving neoadjuvant chemotherapy with Docetaxel, Carboplatin, Trastuzumab given on day 1 of a 21 day cycle with growth factor support with Udenyca on day 3.  Today is cycle 1 day 8.   REVIEW OF SYSTEMS: Natasha Huffman tolerated chemotherapy moderately well.  She did have two episodes of loose bowel movements yesterday, on this AM.  She hasn't taken any imodium yet.  She has one small mouth sore in the corner of her lips.  She denies nausea, vomiting, bowel/bladder changes, fever, chills, headaches, shortness of breath, cough, palpitations or any other concerns.  She is eating and drinking well.  She has been staying at home.    PAST MEDICAL HISTORY: Past Medical History:  Diagnosis Date   Cancer ([place]) 09/23/2018   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 ,    Gallstones    GERD (gastroesophageal reflux disease)    resolved      PAST SURGICAL HISTORY: Past Surgical History:  Procedure Laterality Date   CESAREAN SECTION  1990; 1995   CHOLECYSTECTOMY N/A 02/22/2015   Procedure: LAPAROSCOPIC CHOLECYSTECTOMY WITH INTRAOPERATIVE CHOLANGIOGRAM;  Surgeon: [name], MD;  Location: [facility];  Service: General;  Laterality: N/A;   LAPAROSCOPIC CHOLECYSTECTOMY  02/22/2015   PORTACATH PLACEMENT Right 10/08/2018   Procedure: INSERTION PORT-A-CATH WITH ULTRASOUND;  Surgeon: [name], MD;  Location: [facility];  Service: General;  Laterality: Right;   TUBAL LIGATION  1995     FAMILY HISTORY: Family History  Problem Relation Age of Onset   Hypertension Sister    Colon cancer Maternal Aunt    Breast cancer Neg Hx    [name]'s father died from sepsis at age 20. Patients' mother is 72 years old as of 09/2018. The patient has 3  brothers and 5 sisters. Patient denies anyone in her family having breast, ovarian, prostate, or pancreatic cancer. [name]'s maternal aunt was diagnosed with colon cancer at age 36.    GYNECOLOGIC HISTORY:  No LMP recorded.  Patient is postmenopausal. 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 Her husband, [name], works at a [employer] in [employer]. 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 Natasha Huffman has two children, Natasha Huffman and Natasha Huffman. Natasha Huffman is 66, lives in [place], [place], and is a Animator. Natasha Huffman is 56, lives in [place], and works for the [employer]. [name] has one grandchild. She attends the [employer] in [place]: Natasha Huffman's husband, Natasha Huffman, is automatically her healthcare power of attorney.     HEALTH MAINTENANCE: Social History   Tobacco Use   Smoking status: Never Smoker   Smokeless tobacco: Never Used  Substance Use Topics   Alcohol use: No   Drug use: No    Colonoscopy: yes, 2015, [facility]  PAP: 09/2017  Bone density: no   No Known Allergies  Current Outpatient Medications  Medication Sig Dispense Refill   acetaminophen (TYLENOL) 325 MG tablet Take 650 mg by mouth every 6 (six) hours as needed for moderate pain or headache.     dexamethasone (DECADRON) 4 MG tablet Take 2 tablets (8 mg total) by mouth 2 (two) times daily. Start the day before Taxotere. Take once the day after, then 2 times a day x 2d. 30 tablet 1   fluconazole (DIFLUCAN) 100 MG tablet Take 1 tablet (100 mg total) by mouth daily. 21 tablet 1   lidocaine-prilocaine (EMLA) cream Apply to affected area once 30 g 3   LORazepam (ATIVAN) 0.5 MG tablet Take 1 tablet (0.5 mg total) by mouth at bedtime as needed (Nausea or vomiting). 20 tablet 0   oxyCODONE (OXY IR/ROXICODONE) 5 MG immediate release tablet Take 1 tablet (5 mg total) by mouth every 6 (six) hours as needed for severe pain. 12 tablet 0   prochlorperazine (COMPAZINE) 10 MG  tablet Take 1 tablet (10 mg total) by mouth every 6 (six) hours as needed (Nausea or vomiting). 30 tablet 1   No current facility-administered medications for this visit.      OBJECTIVE: Vitals:   10/29/18 1025  BP: 127/77  Pulse: (!) 101  Resp: 18  Temp: 99.2 F (37.3 C)  SpO2: 98%     Body mass index is 37 kg/m.   Wt Readings from Last 3 Encounters:  10/29/18 202 lb 4.8 oz (91.8 kg)  10/14/18 207 lb 4.8 oz (94 kg)  10/08/18 207 lb (93.9 kg)      ECOG FS:0 - Asymptomatic  GENERAL: Patient is a well appearing female in no acute distress HEENT:  Sclerae anicteric.  Oropharynx clear and moist. No ulcerations or evidence of oropharyngeal candidiasis. Neck is supple.  NODES:  No cervical, supraclavicular, or axillary lymphadenopathy palpated.  BREAST EXAM:  Deferred. Port in place and healing well, no sign of infection or swelling noted LUNGS:  Clear to auscultation bilaterally.  No wheezes or rhonchi. HEART:  Regular rate and rhythm. No murmur appreciated. ABDOMEN:  Soft, nontender.  Positive, normoactive bowel sounds. No organomegaly palpated. MSK:  No focal spinal tenderness to palpation. Full  range of motion bilaterally in the upper extremities. EXTREMITIES:  No peripheral edema.   SKIN:  Clear with no obvious rashes or skin changes. No nail dyscrasia. NEURO:  Nonfocal. Well oriented.  Appropriate affect.     LAB RESULTS:  CMP     Component Value Date/Time   NA 140 10/21/2018 1104   K 4.0 10/21/2018 1104   CL 107 10/21/2018 1104   CO2 23 10/21/2018 1104   GLUCOSE 108 (H) 10/21/2018 1104   BUN 10 10/21/2018 1104   CREATININE 0.80 10/21/2018 1104   CALCIUM 9.1 10/21/2018 1104   PROT 7.7 10/21/2018 1104   ALBUMIN 3.8 10/21/2018 1104   AST 22 10/21/2018 1104   ALT 42 10/21/2018 1104   ALKPHOS 139 (H) 10/21/2018 1104   BILITOT 0.4 10/21/2018 1104   GFRNONAA >60 10/21/2018 1104   GFRAA >60 10/21/2018 1104    No results found for: TOTALPROTELP, ALBUMINELP,  A1GS, A2GS, BETS, BETA2SER, GAMS, MSPIKE, SPEI  No results found for: KPAFRELGTCHN, LAMBDASER, KAPLAMBRATIO  Lab Results  Component Value Date   WBC 1.8 (L) 10/29/2018   NEUTROABS 0.5 (L) 10/29/2018   HGB 12.4 10/29/2018   HCT 39.3 10/29/2018   MCV 84.3 10/29/2018   PLT 216 10/29/2018    '@LASTCHEMISTRY'$ @  No results found for: LABCA2  No components found for: [id number]  No results for input(s): INR in the last 168 hours.  No results found for: LABCA2  No results found for: WCB762  No results found for: GBT517  No results found for: OHY073  No results found for: CA2729  No components found for: HGQUANT  No results found for: CEA1 / No results found for: CEA1   No results found for: AFPTUMOR  No results found for: CHROMOGRNA  No results found for: PSA1  Appointment on 10/29/2018  Component Date Value Ref Range Status   WBC Count 10/29/2018 1.8* 4.0 - 10.5 K/uL Final   RBC 10/29/2018 4.66  3.87 - 5.11 MIL/uL Final   Hemoglobin 10/29/2018 12.4  12.0 - 15.0 g/dL Final   HCT 10/29/2018 39.3  36.0 - 46.0 % Final   MCV 10/29/2018 84.3  80.0 - 100.0 fL Final   MCH 10/29/2018 26.6  26.0 - 34.0 pg Final   MCHC 10/29/2018 31.6  30.0 - 36.0 g/dL Final   RDW 10/29/2018 12.9  11.5 - 15.5 % Final   Platelet Count 10/29/2018 216  150 - 400 K/uL Final   nRBC 10/29/2018 0.0  0.0 - 0.2 % Final   Neutrophils Relative % 10/29/2018 27  % Final   Neutro Abs 10/29/2018 0.5* 1.7 - 7.7 K/uL Final   Lymphocytes Relative 10/29/2018 67  % Final   Lymphs Abs 10/29/2018 1.2  0.7 - 4.0 K/uL Final   Monocytes Relative 10/29/2018 3  % Final   Monocytes Absolute 10/29/2018 0.1  0.1 - 1.0 K/uL Final   Eosinophils Relative 10/29/2018 2  % Final   Eosinophils Absolute 10/29/2018 0.0  0.0 - 0.5 K/uL Final   Basophils Relative 10/29/2018 1  % Final   Basophils Absolute 10/29/2018 0.0  0.0 - 0.1 K/uL Final   Immature Granulocytes 10/29/2018 0  % Final   Abs Immature  Granulocytes 10/29/2018 0.00  0.00 - 0.07 K/uL Final   Performed at [facility], [place] [address] [place], [place] [address]    (this displays the last labs from the last 3 days)  No results found for: TOTALPROTELP, ALBUMINELP, A1GS, A2GS, BETS, BETA2SER, GAMS, MSPIKE, SPEI (  this displays SPEP labs)  No results found for: KPAFRELGTCHN, LAMBDASER, KAPLAMBRATIO (kappa/lambda light chains)  No results found for: HGBA, HGBA2QUANT, HGBFQUANT, HGBSQUAN (Hemoglobinopathy evaluation)   No results found for: LDH  No results found for: IRON, TIBC, IRONPCTSAT (Iron and TIBC)  No results found for: FERRITIN  Urinalysis    Component Value Date/Time   COLORURINE YELLOW 02/22/2015 0118   APPEARANCEUR CLOUDY (A) 02/22/2015 0118   LABSPEC 1.019 02/22/2015 0118   PHURINE 7.0 02/22/2015 0118   GLUCOSEU NEGATIVE 02/22/2015 0118   HGBUR NEGATIVE 02/22/2015 0118   BILIRUBINUR NEGATIVE 02/22/2015 0118   KETONESUR NEGATIVE 02/22/2015 0118   PROTEINUR NEGATIVE 02/22/2015 0118   UROBILINOGEN 1.0 02/22/2015 0118   NITRITE NEGATIVE 02/22/2015 0118   LEUKOCYTESUR NEGATIVE 02/22/2015 0118     STUDIES:  Mr Breast Bilateral W Wo Contrast Inc Cad  Result Date: 10/01/2018 CLINICAL DATA:  Known left-sided breast cancer. LABS:  None provided EXAM: BILATERAL BREAST MRI WITH AND WITHOUT CONTRAST TECHNIQUE: Multiplanar, multisequence MR images of both breasts were obtained prior to and following the intravenous administration of 10 ml of Gadavist Three-dimensional MR images were rendered by post-processing of the original MR data on an independent workstation. The three-dimensional MR images were interpreted, and findings are reported in the following complete MRI report for this study. Three dimensional images were evaluated at the independent DynaCad workstation COMPARISON:  Previous exam(s). FINDINGS: Breast composition: b. Scattered fibroglandular tissue. Background parenchymal  enhancement: Mild Right breast: No mass or abnormal enhancement. Left breast: The patient's known malignancy is identified in the upper outer quadrant of the left breast at a posterior depth. The biopsied mass measures 19 by 16 by 12 mm in AP, transverse, and craniocaudal dimension. There are several small satellite lesions anterior and posterior to the inferior aspect of the known malignancy as seen on series 6, image 75. The satellite lesions are nearly contiguous with the main mass. The maximum AP dimension counting the small satellite lesions is 4.2 cm. There is a small mass at a mid depth in the left breast, posterior to the nipple as seen on series 6, image 104 measuring 6.7 mm in greatest diameter with washout kinetics. Multiple foci of enhancement are seen in both breasts but no other suspicious abnormalities. Lymph nodes: No abnormal appearing lymph nodes. Ancillary findings:  None. IMPRESSION: 1. The patient's biopsied malignancy measures 19 x 16 x 12 mm as above. There are several small satellite lesions anterior and posterior to the inferior aspect of the main mass with a total AP dimension of 4.2 cm. 2. There is a 6.7 mm indeterminate mass in the left breast posterior to the nipple as seen on series 6, image 104 with washout kinetics. 3. No axillary adenopathy. RECOMMENDATION: 1. Recommend MRI guided biopsy of the 6.7 mm mass posterior to the nipple in the left breast. This mass is seen on series 6, image 104. BI-RADS CATEGORY  4: Suspicious. Electronically Signed   By: Dorise Bullion III M.D   On: 10/01/2018 10:25   Dg Chest Port 1 View  Result Date: 10/08/2018 CLINICAL DATA:  Port catheter placement. EXAM: PORTABLE CHEST 1 VIEW COMPARISON:  October 29, 2016 FINDINGS: The heart size and mediastinal contours are within normal limits. Right central venous line is identified with distal tip in the superior vena cava. There is no pneumothorax. Mild linear opacity of the medial left lung base is  identified which may represent atelectasis. There is no focal pneumonia or pleural effusion. Skeletal structures are  unremarkable. IMPRESSION: Right central venous line with distal tip in the superior vena cava. There is no pneumothorax. Probable linear atelectasis of medial left lung base. Electronically Signed   By: Abelardo Diesel M.D.   On: 10/08/2018 14:49   Dg Fluoro Guide Cv Line-no Report  Result Date: 10/08/2018 Fluoroscopy was utilized by the requesting physician.  No radiographic interpretation.   Mm Clip Placement Left  Result Date: 10/12/2018 CLINICAL DATA:  Post biopsy mammogram the left breast for clip placement. EXAM: DIAGNOSTIC LEFT MAMMOGRAM POST MRI BIOPSY COMPARISON:  Previous exam(s). FINDINGS: Mammographic images were obtained following MRI guided biopsy of a mass in the medial left breast. The dumbbell-shaped biopsy marking clip is well positioned at the site of the biopsied mass in the medial left breast. IMPRESSION: Appropriate positioning of the dumbbell-shaped biopsy marking clip in the medial left breast. Final Assessment: Post Procedure Mammograms for Marker Placement Electronically Signed   By: Ammie Ferrier M.D.   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 This was found to be concordant by Dr. Ammie Ferrier, with excision recommended. Pathology results were discussed with the patient by telephone. The patient reported doing well after the biopsy with tenderness at the site. Post biopsy instructions and care were reviewed and questions were answered. The patient was encouraged to call [facility] for any additional concerns. The patient has a recent diagnosis of left breast cancer and should follow her outlined treatment plan. Dr. [name] and Dr. Gunnar Bulla [name]  were notified of biopsy results via EPIC message on October 13, 2018. Pathology results reported by Terie Purser, RN on 10/13/2018. Electronically Signed   By: Ammie Ferrier M.D.   On: 10/13/2018 15:21   Result Date: 10/13/2018 CLINICAL DATA:  58 year old female presenting for MRI guided biopsy of a left breast mass. EXAM: MRI GUIDED CORE NEEDLE BIOPSY OF THE LEFT BREAST TECHNIQUE: Multiplanar, multisequence MR imaging of the left breast was performed both before and after administration of intravenous contrast. CONTRAST:  10 mL of Gadavist. COMPARISON:  Previous exams. FINDINGS: I met with the patient, and we discussed the procedure of MRI guided biopsy, including risks, benefits, and alternatives. Specifically, we discussed the risks of infection, bleeding, tissue injury, clip migration, and inadequate sampling. Informed, written consent was given. The usual time out protocol was performed immediately prior to the procedure. Using sterile technique, 1% Lidocaine, MRI guidance, and a 9 gauge vacuum assisted device, biopsy was performed of a mass medial right breast using a lateral approach. At the conclusion of the procedure, a dumbbell-shaped tissue marker clip was deployed into the biopsy cavity. Follow-up 2-view mammogram was performed and dictated separately. IMPRESSION: MRI guided biopsy of a medial left breast mass. No apparent complications. Electronically Signed: By: Ammie Ferrier M.D. On: 10/12/2018 09:32     ELIGIBLE FOR AVAILABLE RESEARCH PROTOCOL: UPBEAT   ASSESSMENT: 58 y.o. [name], [place] woman status post left breast upper outer quadrant biopsy 09/23/2018 for a clinical T1c N0, stage IA invasive ductal carcinoma, grade 3, triple positive, with an MIB-1 of 40%  (1) neoadjuvant chemotherapy will consist of carboplatin, docetaxel, and trastuzumab to start 10/22/2018, repeated every 21 days x 6  (a) echocardiogram on 10/07/2018 shows a LVEF of 60-65%.  (2) definitive surgery to  follow  (3) adjuvant  radiation  (4) antiestrogens to start at the completion of local treatment    PLAN: Stasha is doing well today.  She tolerated chemotherapy relatively well.  She has slight neutropenia today.  I reviewed neutropenic precautions with her in detail.  I also sent in Cipro for her to take BID.  We also discussed limiting her time in public, and limiting outside visitors in her home considering her neutropenia and the current COVID-19 pandemic.    ZLDJTT and I reviewed her loose bowel movements and how to take Imodium if they continue.  Noya will return in 2 weeks for labs, f/u with Dr. Jana Hakim, and her next treatment.  A total of (20) minutes of face-to-face time was spent with this patient with greater than 50% of that time in counseling and care-coordination.  Wilber Bihari, NP  10/29/18 10:39 AM Medical Oncology and Hematology [facility] [address] [place], [place] [address] Tel. [phone]    Fax. [phone]    .

## 2018-11-06 NOTE — Progress Notes (Signed)
Natasha Huffman  [phone] [phone]    ID: Natasha Huffman DOB: 20-Jan-1961  MR#: [id number]  IWO#:[id number]  Patient Care Team: Natasha Arabian, MD as PCP - General (Family Medicine) Natasha Germany, RN as Oncology Nurse Navigator Natasha Kaufmann, RN as Oncology Nurse Navigator Natasha Luna, MD as Consulting Physician (General Surgery) [facility], [name], MD as Consulting Physician (Oncology) Natasha Rudd, MD as Consulting Physician (Radiation Oncology) OTHER MD:    CHIEF COMPLAINT: Triple positive breast cancer  CURRENT TREATMENT: Neoadjuvant chemotherapy   INTERVAL HISTORY: Natasha Huffman is here today for f/u and treatment of her HER-2 positive breast cancer.    She is receiving neoadjuvant chemotherapy with Docetaxel, Carboplatin, Trastuzumab given on day 1 of a 21 day cycle with growth factor support with Udenyca on day 3. She reports she tolerates this very well. She denies any nausea or other side effects from this.   Since her last visit, she underwent bilateral breast MRI on 10/01/2018, showing: Breast density category B.  The previously biopsied left breast malignancy measured 1.9 cm with several small satellite lesions with a total AP dimension of 4.2 cm; there was also an indeterminate 6.7 mm mass in the left breast posterior to the nipple; no axillary adenopathy.   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  Echocardiogram on 10/07/2018 showed an ejection fraction of 60-65%.   REVIEW OF SYSTEMS: Natasha Huffman reports she is working from home, and has been since 10/21/2018. She is home with her husband, 1 adopted son, and 2 foster sons. She reports doing well overall. The patient denies unusual headaches, visual changes, nausea, vomiting, stiff neck, dizziness, or gait imbalance. There has been no cough, phlegm production, or pleurisy, no chest pain or  pressure, and no change in bowel or bladder habits. The patient denies fever, rash, bleeding, unexplained fatigue or unexplained weight loss. A detailed review of systems was otherwise entirely negative.    HISTORY OF CURRENT ILLNESS: From the original intake note:  Natasha Huffman had routine screening mammography on 09/23/2018 showing a possible abnormality in the left breast. She underwent unilateral left diagnostic mammography with tomography and left breast ultrasonography at [facility] on 09/23/2018 showing: Breast Density Category B. There is a mass with irregular margins in the upper-outer quadrant of the left breast. On physical exam, there is palpable focal thickening in the 2 o'clock location of the left breast. Sonographically, an irregular hypoechoic mass with internal vascularity in the 2 o'clock location of the left breast 10 cm from the nipple is seen. The mass measures 1.8 x 1.1 x 1.0 cm. There is associated posterior acoustic enhancement. Evaluation of the left axilla is negative for adenopathy.  Accordingly on 09/23/2018 she proceeded to biopsy of the left breast area in question. The pathology from this procedure showed ([id number]): invasive ductal carcinoma, grade III. Prognostic indicators significant for: estrogen receptor, 100% positive and progesterone receptor, 2% positive, both with strong staining intensity. Proliferation marker Ki67 at 40%. HER2 positive (3+) by immunohistochemistry.   The patient's subsequent history is as detailed above.   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 ,    Gallstones    GERD (gastroesophageal reflux disease)    resolved      PAST SURGICAL HISTORY: Past Surgical History:  Procedure Laterality Date   CESAREAN SECTION  1990; 1995   CHOLECYSTECTOMY N/A 02/22/2015   Procedure: LAPAROSCOPIC CHOLECYSTECTOMY WITH INTRAOPERATIVE CHOLANGIOGRAM;  Surgeon:  [name], MD;  Location: [facility];  Service: General;  Laterality: N/A;   LAPAROSCOPIC CHOLECYSTECTOMY  02/22/2015   PORTACATH PLACEMENT Right 10/08/2018   Procedure: INSERTION PORT-A-CATH WITH ULTRASOUND;  Surgeon: Natasha Luna, MD;  Location[facility];  Service: General;  Laterality: Right;   TUBAL LIGATION  1995     FAMILY HISTORY: Family History  Problem Relation Age of Onset   Hypertension Sister    Colon cancer Maternal Aunt    Breast cancer Neg Hx    Natasha Huffman's father died from sepsis at age 3. Patients' mother is 33 years old as of 09/2018. The patient has 3 brothers and 5 sisters. Patient denies anyone in her family having breast, ovarian, prostate, or pancreatic cancer. Natasha Huffman's maternal aunt was diagnosed with colon cancer at age 36.    GYNECOLOGIC HISTORY:  No LMP recorded. Patient is postmenopausal. 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 Her husband, Natasha Huffman, works at a [employer] in [place]. 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 Natasha Huffman has two children, Natasha Huffman and Natasha Huffman. Natasha Huffman is 71, lives in [place], [place], and is a Animator. Natasha Huffman is 71, lives in [place], and works for the [employer]. Natasha Huffman has one grandchild. She attends the [employer] in [place]: Natasha Huffman's husband, Natasha Huffman, is automatically her healthcare power of attorney.     HEALTH MAINTENANCE: Social History   Tobacco Use   Smoking status: Never Smoker   Smokeless tobacco: Never Used  Substance Use Topics   Alcohol use: No   Drug use: No    Colonoscopy: yes, 2015, [facility]  PAP: 09/2017  Bone density: no   No Known Allergies  Current Outpatient Medications  Medication Sig Dispense  Refill   acetaminophen (TYLENOL) 325 MG tablet Take 650 mg by mouth every 6 (six) hours as needed for moderate pain or headache.     ciprofloxacin (CIPRO) 500 MG tablet Take 1 tablet (500 mg total) by mouth 2 (two) times daily. 14 tablet 0   dexamethasone (DECADRON) 4 MG tablet Take 2 tablets (8 mg total) by mouth 2 (two) times daily. Start the day before Taxotere. Take once the day after, then 2 times a day x 2d. 30 tablet 1   fluconazole (DIFLUCAN) 100 MG tablet Take 1 tablet (100 mg total) by mouth daily. 21 tablet 1   lidocaine-prilocaine (EMLA) cream Apply to affected area once 30 g 3   LORazepam (ATIVAN) 0.5 MG tablet Take 1 tablet (0.5 mg total) by mouth at bedtime as needed (Nausea or vomiting). 20 tablet 0   oxyCODONE (OXY IR/ROXICODONE) 5 MG immediate release tablet Take 1 tablet (5 mg total) by mouth every 6 (six) hours as needed for severe pain. 12 tablet 0   prochlorperazine (COMPAZINE) 10 MG tablet Take 1 tablet (10 mg total) by mouth every 6 (six) hours as needed (Nausea or vomiting). 30 tablet 1   No current facility-administered medications for this visit.      OBJECTIVE: Middle-aged African-American woman who appears well  Vitals:   11/11/18 1002  BP: 134/88  Pulse: 87  Resp: 20  Temp: 98.7 F (  37.1 C)  SpO2: 100%     Body mass index is 37.99 kg/m.   Wt Readings from Last 3 Encounters:  11/11/18 207 lb 11.2 oz (94.2 kg)  10/29/18 202 lb 4.8 oz (91.8 kg)  10/14/18 207 lb 4.8 oz (94 kg)      ECOG FS:0 - Asymptomatic  Sclerae unicteric, EOMs intact No cervical or supraclavicular adenopathy Lungs no rales or rhonchi Heart regular rate and rhythm Abd soft, nontender, positive bowel sounds MSK no focal spinal tenderness, no upper extremity lymphedema Neuro: nonfocal, well oriented, appropriate affect Breasts: I do not palpate a well-defined mass in the left breast today.  Both axillae are benign.   LAB RESULTS:  CMP     Component Value Date/Time    NA 139 10/29/2018 1012   K 4.2 10/29/2018 1012   CL 104 10/29/2018 1012   CO2 25 10/29/2018 1012   GLUCOSE 109 (H) 10/29/2018 1012   BUN 11 10/29/2018 1012   CREATININE 0.82 10/29/2018 1012   CALCIUM 8.7 (L) 10/29/2018 1012   PROT 7.1 10/29/2018 1012   ALBUMIN 3.7 10/29/2018 1012   AST 25 10/29/2018 1012   ALT 51 (H) 10/29/2018 1012   ALKPHOS 107 10/29/2018 1012   BILITOT 0.6 10/29/2018 1012   GFRNONAA >60 10/29/2018 1012   GFRAA >60 10/29/2018 1012    No results found for: TOTALPROTELP, ALBUMINELP, A1GS, A2GS, BETS, BETA2SER, GAMS, MSPIKE, SPEI  No results found for: KPAFRELGTCHN, LAMBDASER, KAPLAMBRATIO  Lab Results  Component Value Date   WBC 6.6 11/11/2018   NEUTROABS 3.9 11/11/2018   HGB 11.2 (L) 11/11/2018   HCT 35.8 (L) 11/11/2018   MCV 86.1 11/11/2018   PLT 229 11/11/2018    '@LASTCHEMISTRY'$ @  No results found for: LABCA2  No components found for: [id number]  No results for input(s): INR in the last 168 hours.  No results found for: LABCA2  No results found for: DGU440  No results found for: HKV425  No results found for: ZDG387  No results found for: CA2729  No components found for: HGQUANT  No results found for: CEA1 / No results found for: CEA1   No results found for: AFPTUMOR  No results found for: CHROMOGRNA  No results found for: PSA1  Appointment on 11/11/2018  Component Date Value Ref Range Status   WBC Count 11/11/2018 6.6  4.0 - 10.5 K/uL Final   RBC 11/11/2018 4.16  3.87 - 5.11 MIL/uL Final   Hemoglobin 11/11/2018 11.2* 12.0 - 15.0 g/dL Final   HCT 11/11/2018 35.8* 36.0 - 46.0 % Final   MCV 11/11/2018 86.1  80.0 - 100.0 fL Final   MCH 11/11/2018 26.9  26.0 - 34.0 pg Final   MCHC 11/11/2018 31.3  30.0 - 36.0 g/dL Final   RDW 11/11/2018 13.4  11.5 - 15.5 % Final   Platelet Count 11/11/2018 229  150 - 400 K/uL Final   nRBC 11/11/2018 0.0  0.0 - 0.2 % Final   Neutrophils Relative % 11/11/2018 59  % Final   Neutro Abs  11/11/2018 3.9  1.7 - 7.7 K/uL Final   Lymphocytes Relative 11/11/2018 31  % Final   Lymphs Abs 11/11/2018 2.1  0.7 - 4.0 K/uL Final   Monocytes Relative 11/11/2018 6  % Final   Monocytes Absolute 11/11/2018 0.4  0.1 - 1.0 K/uL Final   Eosinophils Relative 11/11/2018 2  % Final   Eosinophils Absolute 11/11/2018 0.1  0.0 - 0.5 K/uL Final   Basophils Relative 11/11/2018 1  %  Final   Basophils Absolute 11/11/2018 0.1  0.0 - 0.1 K/uL Final   Immature Granulocytes 11/11/2018 1  % Final   Abs Immature Granulocytes 11/11/2018 0.05  0.00 - 0.07 K/uL Final   Performed at [facility], [address] [place], [place] [address]    (this displays the last labs from the last 3 days)  No results found for: TOTALPROTELP, ALBUMINELP, A1GS, A2GS, BETS, BETA2SER, GAMS, MSPIKE, SPEI (this displays SPEP labs)  No results found for: KPAFRELGTCHN, LAMBDASER, KAPLAMBRATIO (kappa/lambda light chains)  No results found for: HGBA, HGBA2QUANT, HGBFQUANT, HGBSQUAN (Hemoglobinopathy evaluation)   No results found for: LDH  No results found for: IRON, TIBC, IRONPCTSAT (Iron and TIBC)  No results found for: FERRITIN  Urinalysis    Component Value Date/Time   COLORURINE YELLOW 02/22/2015 0118   APPEARANCEUR CLOUDY (A) 02/22/2015 0118   LABSPEC 1.019 02/22/2015 0118   PHURINE 7.0 02/22/2015 0118   GLUCOSEU NEGATIVE 02/22/2015 0118   HGBUR NEGATIVE 02/22/2015 0118   BILIRUBINUR NEGATIVE 02/22/2015 0118   KETONESUR NEGATIVE 02/22/2015 0118   PROTEINUR NEGATIVE 02/22/2015 0118   UROBILINOGEN 1.0 02/22/2015 0118   NITRITE NEGATIVE 02/22/2015 0118   LEUKOCYTESUR NEGATIVE 02/22/2015 0118     STUDIES:  No results found.   ELIGIBLE FOR AVAILABLE RESEARCH PROTOCOL: no   ASSESSMENT: 58 y.o. [name], [place] woman status post left breast upper outer quadrant biopsy 09/23/2018 for a clinical T1c N0, stage IA invasive ductal carcinoma, grade 3, triple positive, with an  MIB-1 of 40%  (1) neoadjuvant chemotherapy consisting of carboplatin, docetaxel, and trastuzumab started 10/22/2018, to be repeated every 21 days x 6  (a) echocardiogram on 10/07/2018 shows a LVEF of 60-65%.  (2) definitive surgery to follow  (3) adjuvant radiation  (4) antiestrogens to start at the completion of local treatment    PLAN: Almee did remarkably well with her first cycle of chemotherapy.  Her nadir count was 0.5 and she will likely need ciprofloxacin prophylaxis after every treatment.  I have changed her day 8 appointment to WebEx and in the future she may not need day 8 appointments as she is doing so well  Because of the current pandemic concerns, her treatment which was scheduled for tomorrow, will be done today.  We are also moving her treatments from Wednesdays, which is the clinic today, to Tuesdays in the future.  The plan is still to proceed to 6 cycles of this chemotherapy, then repeat a breast MRI, and proceed to definitive surgery and radiation  We reviewed pandemic precautions and she and her family appear to be isolating themselves appropriately.  She knows to call for any other issue that may develop before the next visit.  [name]. Stephenia Vogan, MD  11/11/18 10:21 AM Medical Oncology and Hematology [facility] [address] [place], [place] [address] Tel. [phone]    Fax. [phone]    I, Wilburn Mylar, am acting as scribe for Dr. [name]. Tyechia Allmendinger.  I, Lurline Del MD, have reviewed the above documentation for accuracy and completeness, and I agree with the above.

## 2018-11-11 ENCOUNTER — Inpatient Hospital Stay (HOSPITAL_BASED_OUTPATIENT_CLINIC_OR_DEPARTMENT_OTHER): Payer: 59 | Admitting: Oncology

## 2018-11-11 ENCOUNTER — Inpatient Hospital Stay: Payer: 59 | Attending: Oncology

## 2018-11-11 ENCOUNTER — Other Ambulatory Visit: Payer: Self-pay

## 2018-11-11 ENCOUNTER — Inpatient Hospital Stay: Payer: 59

## 2018-11-11 ENCOUNTER — Telehealth: Payer: Self-pay | Admitting: Oncology

## 2018-11-11 VITALS — BP 134/88 | HR 87 | Temp 98.7°F | Resp 20 | Ht 62.0 in | Wt 207.7 lb

## 2018-11-11 DIAGNOSIS — Z5111 Encounter for antineoplastic chemotherapy: Secondary | ICD-10-CM | POA: Diagnosis not present

## 2018-11-11 DIAGNOSIS — Z79899 Other long term (current) drug therapy: Secondary | ICD-10-CM

## 2018-11-11 DIAGNOSIS — Z780 Asymptomatic menopausal state: Secondary | ICD-10-CM | POA: Insufficient documentation

## 2018-11-11 DIAGNOSIS — C50412 Malignant neoplasm of upper-outer quadrant of left female breast: Secondary | ICD-10-CM

## 2018-11-11 DIAGNOSIS — K5900 Constipation, unspecified: Secondary | ICD-10-CM | POA: Insufficient documentation

## 2018-11-11 DIAGNOSIS — K219 Gastro-esophageal reflux disease without esophagitis: Secondary | ICD-10-CM | POA: Insufficient documentation

## 2018-11-11 DIAGNOSIS — R110 Nausea: Secondary | ICD-10-CM | POA: Insufficient documentation

## 2018-11-11 DIAGNOSIS — R5383 Other fatigue: Secondary | ICD-10-CM | POA: Insufficient documentation

## 2018-11-11 DIAGNOSIS — Z7689 Persons encountering health services in other specified circumstances: Secondary | ICD-10-CM | POA: Diagnosis not present

## 2018-11-11 DIAGNOSIS — Z170 Estrogen receptor positive status [ER+]: Secondary | ICD-10-CM | POA: Diagnosis not present

## 2018-11-11 DIAGNOSIS — Z5112 Encounter for antineoplastic immunotherapy: Secondary | ICD-10-CM | POA: Insufficient documentation

## 2018-11-11 DIAGNOSIS — R439 Unspecified disturbances of smell and taste: Secondary | ICD-10-CM | POA: Diagnosis not present

## 2018-11-11 DIAGNOSIS — Z800 Family history of malignant neoplasm of digestive organs: Secondary | ICD-10-CM | POA: Diagnosis not present

## 2018-11-11 LAB — CBC WITH DIFFERENTIAL (CANCER CENTER ONLY)
Abs Immature Granulocytes: 0.05 10*3/uL (ref 0.00–0.07)
Basophils Absolute: 0.1 10*3/uL (ref 0.0–0.1)
Basophils Relative: 1 %
Eosinophils Absolute: 0.1 10*3/uL (ref 0.0–0.5)
Eosinophils Relative: 2 %
HCT: 35.8 % — ABNORMAL LOW (ref 36.0–46.0)
Hemoglobin: 11.2 g/dL — ABNORMAL LOW (ref 12.0–15.0)
Immature Granulocytes: 1 %
Lymphocytes Relative: 31 %
Lymphs Abs: 2.1 10*3/uL (ref 0.7–4.0)
MCH: 26.9 pg (ref 26.0–34.0)
MCHC: 31.3 g/dL (ref 30.0–36.0)
MCV: 86.1 fL (ref 80.0–100.0)
Monocytes Absolute: 0.4 10*3/uL (ref 0.1–1.0)
Monocytes Relative: 6 %
Neutro Abs: 3.9 10*3/uL (ref 1.7–7.7)
Neutrophils Relative %: 59 %
Platelet Count: 229 10*3/uL (ref 150–400)
RBC: 4.16 MIL/uL (ref 3.87–5.11)
RDW: 13.4 % (ref 11.5–15.5)
WBC Count: 6.6 10*3/uL (ref 4.0–10.5)
nRBC: 0 % (ref 0.0–0.2)

## 2018-11-11 LAB — CMP (CANCER CENTER ONLY)
ALT: 51 U/L — ABNORMAL HIGH (ref 0–44)
AST: 20 U/L (ref 15–41)
Albumin: 3.8 g/dL (ref 3.5–5.0)
Alkaline Phosphatase: 132 U/L — ABNORMAL HIGH (ref 38–126)
Anion gap: 9 (ref 5–15)
BUN: 13 mg/dL (ref 6–20)
CO2: 26 mmol/L (ref 22–32)
Calcium: 9.1 mg/dL (ref 8.9–10.3)
Chloride: 108 mmol/L (ref 98–111)
Creatinine: 0.78 mg/dL (ref 0.44–1.00)
GFR, Est AFR Am: >60 mL/min (ref >60)
GFR, Estimated: >60 mL/min (ref >60)
Glucose, Bld: 103 mg/dL — ABNORMAL HIGH (ref 70–99)
Potassium: 4.1 mmol/L (ref 3.5–5.1)
Sodium: 143 mmol/L (ref 135–145)
Total Bilirubin: 0.3 mg/dL (ref 0.3–1.2)
Total Protein: 7.2 g/dL (ref 6.5–8.1)

## 2018-11-11 MED ORDER — ACETAMINOPHEN 325 MG PO TABS
650.0000 mg | ORAL_TABLET | Freq: Once | ORAL | Status: AC
  Administered 2018-11-11: 650 mg via ORAL

## 2018-11-11 MED ORDER — TRASTUZUMAB CHEMO 150 MG IV SOLR
600.0000 mg | Freq: Once | INTRAVENOUS | Status: AC
  Administered 2018-11-11: 600 mg via INTRAVENOUS
  Filled 2018-11-11: qty 28.57

## 2018-11-11 MED ORDER — ACETAMINOPHEN 325 MG PO TABS
ORAL_TABLET | ORAL | Status: AC
  Filled 2018-11-11: qty 2

## 2018-11-11 MED ORDER — SODIUM CHLORIDE 0.9% FLUSH
10.0000 mL | INTRAVENOUS | Status: DC | PRN
  Administered 2018-11-11: 10 mL
  Filled 2018-11-11: qty 10

## 2018-11-11 MED ORDER — PALONOSETRON HCL INJECTION 0.25 MG/5ML
0.2500 mg | Freq: Once | INTRAVENOUS | Status: AC
  Administered 2018-11-11: 12:00:00 0.25 mg via INTRAVENOUS

## 2018-11-11 MED ORDER — PEGFILGRASTIM 6 MG/0.6ML ~~LOC~~ PSKT
PREFILLED_SYRINGE | SUBCUTANEOUS | Status: AC
  Filled 2018-11-11: qty 0.6

## 2018-11-11 MED ORDER — DIPHENHYDRAMINE HCL 25 MG PO CAPS
25.0000 mg | ORAL_CAPSULE | Freq: Once | ORAL | Status: AC
  Administered 2018-11-11: 25 mg via ORAL

## 2018-11-11 MED ORDER — SODIUM CHLORIDE 0.9 % IV SOLN
575.0000 mg | Freq: Once | INTRAVENOUS | Status: AC
  Administered 2018-11-11: 14:00:00 580 mg via INTRAVENOUS
  Filled 2018-11-11: qty 58

## 2018-11-11 MED ORDER — SODIUM CHLORIDE 0.9 % IV SOLN
75.0000 mg/m2 | Freq: Once | INTRAVENOUS | Status: AC
  Administered 2018-11-11: 150 mg via INTRAVENOUS
  Filled 2018-11-11: qty 15

## 2018-11-11 MED ORDER — SODIUM CHLORIDE 0.9 % IV SOLN
Freq: Once | INTRAVENOUS | Status: AC
  Administered 2018-11-11: 12:00:00 via INTRAVENOUS
  Filled 2018-11-11: qty 250

## 2018-11-11 MED ORDER — DIPHENHYDRAMINE HCL 25 MG PO CAPS
ORAL_CAPSULE | ORAL | Status: AC
  Filled 2018-11-11: qty 1

## 2018-11-11 MED ORDER — PALONOSETRON HCL INJECTION 0.25 MG/5ML
INTRAVENOUS | Status: AC
  Filled 2018-11-11: qty 5

## 2018-11-11 MED ORDER — PEGFILGRASTIM 6 MG/0.6ML ~~LOC~~ PSKT
6.0000 mg | PREFILLED_SYRINGE | Freq: Once | SUBCUTANEOUS | Status: AC
  Administered 2018-11-11: 15:00:00 6 mg via SUBCUTANEOUS

## 2018-11-11 MED ORDER — HEPARIN SOD (PORK) LOCK FLUSH 100 UNIT/ML IV SOLN
500.0000 [IU] | Freq: Once | INTRAVENOUS | Status: AC | PRN
  Administered 2018-11-11: 15:00:00 500 [IU]
  Filled 2018-11-11: qty 5

## 2018-11-11 MED ORDER — SODIUM CHLORIDE 0.9 % IV SOLN
Freq: Once | INTRAVENOUS | Status: AC
  Administered 2018-11-11: 12:00:00 via INTRAVENOUS
  Filled 2018-11-11: qty 5

## 2018-11-11 NOTE — Progress Notes (Signed)
Per MD keep Carbo dose at 580 mg using CrCl 90

## 2018-11-11 NOTE — Telephone Encounter (Signed)
No 4/1 los °

## 2018-11-11 NOTE — Patient Instructions (Signed)
Coronavirus (COVID-19) Are you at risk?  Are you at risk for the Coronavirus (COVID-19)?  To be considered HIGH RISK for Coronavirus (COVID-19), you have to meet the following criteria:  . Traveled to [place], [place], [place], [place] or [place]; or in the [place] to [place], [place], [place], or [place]; and have fever, cough, and shortness of breath within the last 2 weeks of travel OR . Been in close contact with a person diagnosed with COVID-19 within the last 2 weeks and have fever, cough, and shortness of breath . IF YOU DO NOT MEET THESE CRITERIA, YOU ARE CONSIDERED LOW RISK FOR COVID-19.  What to do if you are HIGH RISK for COVID-19?  . If you are having a medical emergency, call 911. . Seek medical care right away. Before you go to a doctor's office, urgent care or emergency department, call ahead and tell them about your recent travel, contact with someone diagnosed with COVID-19, and your symptoms. You should receive instructions from your physician's office regarding next steps of care.  . When you arrive at healthcare provider, tell the healthcare staff immediately you have returned from visiting [place], [place], [place], [place] or [place]; or traveled in the [place] to [place], [place], [place], or [place]; in the last two weeks or you have been in close contact with a person diagnosed with COVID-19 in the last 2 weeks.   . Tell the health care staff about your symptoms: fever, cough and shortness of breath. . After you have been seen by a medical provider, you will be either: o Tested for (COVID-19) and discharged home on quarantine except to seek medical care if symptoms worsen, and asked to  - Stay home and avoid contact with others until you get your results (4-5 days)  - Avoid travel on public transportation if possible (such as bus, train, or airplane) or o Sent to the Emergency Department by EMS for evaluation, COVID-19 testing, and possible  admission depending on your condition and test results.  What to do if you are LOW RISK for COVID-19?  Reduce your risk of any infection by using the same precautions used for avoiding the common cold or flu:  . Wash your hands often with soap and warm water for at least 20 seconds.  If soap and water are not readily available, use an alcohol-based hand sanitizer with at least 60% alcohol.  . If coughing or sneezing, cover your mouth and nose by coughing or sneezing into the elbow areas of your shirt or coat, into a tissue or into your sleeve (not your hands). . Avoid shaking hands with others and consider head nods or verbal greetings only. . Avoid touching your eyes, nose, or mouth with unwashed hands.  . Avoid close contact with people who are sick. . Avoid places or events with large numbers of people in one location, like concerts or sporting events. . Carefully consider travel plans you have or are making. . If you are planning any travel outside or inside the [place], visit the CDC's Travelers' Health webpage for the latest health notices. . If you have some symptoms but not all symptoms, continue to monitor at home and seek medical attention if your symptoms worsen. . If you are having a medical emergency, call 911.   ADDITIONAL HEALTHCARE OPTIONS FOR PATIENTS  [facility] / e-Visit: [email or web address]         [facility]: [phone]  Onset   Urgent Care: [facility]: [place] Discharge Instructions for Patients Receiving Chemotherapy  Today you received the following chemotherapy agents Herceptin, Taxotere and Carboplatin   To help prevent nausea and vomiting after your treatment, we encourage you to take your nausea medication as directed. No Zofran for 3 days, take Compazine instead.    If you develop nausea and vomiting that is  not controlled by your nausea medication, call the clinic.   BELOW ARE SYMPTOMS THAT SHOULD BE REPORTED IMMEDIATELY:  *FEVER GREATER THAN 100.5 F  *CHILLS WITH OR WITHOUT FEVER  NAUSEA AND VOMITING THAT IS NOT CONTROLLED WITH YOUR NAUSEA MEDICATION  *UNUSUAL SHORTNESS OF BREATH  *UNUSUAL BRUISING OR BLEEDING  TENDERNESS IN MOUTH AND THROAT WITH OR WITHOUT PRESENCE OF ULCERS  *URINARY PROBLEMS  *BOWEL PROBLEMS  UNUSUAL RASH Items with * indicate a potential emergency and should be followed up as soon as possible.  Feel free to call the clinic should you have any questions or concerns. The clinic phone number is [phone].  Please show the [facility] at check-in to the Emergency Department and triage nurse.

## 2018-11-12 ENCOUNTER — Inpatient Hospital Stay: Payer: 59

## 2018-11-14 ENCOUNTER — Ambulatory Visit: Payer: 59

## 2018-11-17 ENCOUNTER — Telehealth: Payer: Self-pay | Admitting: Adult Health

## 2018-11-17 NOTE — Telephone Encounter (Signed)
Spoke with patient and sent her join link to [email or web address]. Informed patient to not come in for labs.

## 2018-11-19 ENCOUNTER — Encounter: Payer: Self-pay | Admitting: Adult Health

## 2018-11-19 ENCOUNTER — Inpatient Hospital Stay: Payer: 59

## 2018-11-19 ENCOUNTER — Inpatient Hospital Stay (HOSPITAL_BASED_OUTPATIENT_CLINIC_OR_DEPARTMENT_OTHER): Payer: 59 | Admitting: Adult Health

## 2018-11-19 DIAGNOSIS — Z170 Estrogen receptor positive status [ER+]: Secondary | ICD-10-CM

## 2018-11-19 DIAGNOSIS — C50412 Malignant neoplasm of upper-outer quadrant of left female breast: Secondary | ICD-10-CM | POA: Diagnosis not present

## 2018-11-19 MED ORDER — CIPROFLOXACIN HCL 500 MG PO TABS: 500.0000 mg | ORAL_TABLET | Freq: Two times a day (BID) | ORAL | 4 refills | Status: DC

## 2018-11-19 NOTE — Progress Notes (Signed)
[facility]  [phone] Fax:([phone]) [phone]    ID: Natasha Huffman DOB: [birth year]  MR#: [id number]  XNA#:[id number]  Patient Care Team: [name], MD as PCP - General (Family Medicine) Rockwell Germany, RN as Oncology Nurse Navigator Mauro Kaufmann, RN as Oncology Nurse Navigator [name], MD as Consulting Physician (General Surgery) [name], [name], MD as Consulting Physician (Oncology) Kyung Rudd, MD as Consulting Physician (Radiation Oncology) OTHER MD:    Virtual Visit via Video Note  I connected with [phone] Natasha Huffman on 11/19/18 at  3:00 PM EDT by a video enabled telemedicine application and verified that I am speaking with the correct person using two identifiers.   I discussed the limitations of evaluation and management by telemedicine and the availability of in person appointments. The patient expressed understanding and agreed to proceed.  CHIEF COMPLAINT: Triple positive breast cancer  CURRENT TREATMENT: Neoadjuvant chemotherapy   INTERVAL HISTORY: Natasha Huffman is here today for f/u and treatment of her HER-2 positive breast cancer.    She is receiving neoadjuvant chemotherapy with Docetaxel, Carboplatin, Trastuzumab given on day 1 of a 21 day cycle with growth factor support with Onpro. Today is cycle 2 day 8.    Echocardiogram on 10/07/2018 showed an ejection fraction of 60-65%.   REVIEW OF SYSTEMS: Natasha Huffman reports she is working from home, and has been since 10/21/2018. She is home with her husband, 1 adopted son, and 2 foster sons. She has been riding in the car placed, but not going in, which has helped her to get out of her house, while avoiding others who may possibly be sick.    She notes that chemotherapy went relatively well.  She says that Sunday and Monday she was fatigued.  She experienced mild nausea that was controlled with her anti emetics, and was also mildly constipated.  She says her feet feel different, and  thinks that she has some mild skin changes on the soles and dryness.  She denies any numbness or tingling.  She denies any balance changes.  She has been taking claritin.    Natasha Huffman denies cough, shortness of breath, chest pain, palpitations, fevers, chills.  She hasn't noted bladder concerns, unusual headaches, vision changes, vomiting.  A detailed ROS was otherwise non contributory today.     HISTORY OF CURRENT ILLNESS: From the original intake note:  Natasha Huffman had routine screening mammography on 09/23/2018 showing a possible abnormality in the left breast. She underwent unilateral left diagnostic mammography with tomography and left breast ultrasonography at [facility] on 09/23/2018 showing: Breast Density Category B. There is a mass with irregular margins in the upper-outer quadrant of the left breast. On physical exam, there is palpable focal thickening in the 2 o'clock location of the left breast. Sonographically, an irregular hypoechoic mass with internal vascularity in the 2 o'clock location of the left breast 10 cm from the nipple is seen. The mass measures 1.8 x 1.1 x 1.0 cm. There is associated posterior acoustic enhancement. Evaluation of the left axilla is negative for adenopathy.  Accordingly on 09/23/2018 she proceeded to biopsy of the left breast area in question. The pathology from this procedure showed ([id number]): invasive ductal carcinoma, grade III. Prognostic indicators significant for: estrogen receptor, 100% positive and progesterone receptor, 2% positive, both with strong staining intensity. Proliferation marker Ki67 at 40%. HER2 positive (3+) by immunohistochemistry.   The patient's subsequent history is as detailed above.   PAST MEDICAL HISTORY: Past Medical History:  Diagnosis Date  . Cancer ([place]) 09/23/2018   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 ,   . Gallstones   . GERD (gastroesophageal reflux disease)    resolved      PAST SURGICAL HISTORY: Past Surgical  History:  Procedure Laterality Date  . [place]; 1995  . CHOLECYSTECTOMY N/A 02/22/2015   Procedure: LAPAROSCOPIC CHOLECYSTECTOMY WITH INTRAOPERATIVE CHOLANGIOGRAM;  Surgeon: [name], MD;  Location: [facility];  Service: General;  Laterality: N/A;  . LAPAROSCOPIC CHOLECYSTECTOMY  02/22/2015  . PORTACATH PLACEMENT Right 10/08/2018   Procedure: INSERTION PORT-A-CATH WITH ULTRASOUND;  Surgeon: [name], MD;  Location: [facility];  Service: General;  Laterality: Right;  . TUBAL LIGATION  1995     FAMILY HISTORY: Family History  Problem Relation Age of Onset  . Hypertension Sister   . Colon cancer Maternal Aunt   . Breast cancer Neg Hx    Natasha Huffman's father died from sepsis at age 11. Patients' mother is 14 years old as of 09/2018. The patient has 3 brothers and 5 sisters. Patient denies anyone in her family having breast, ovarian, prostate, or pancreatic cancer. Natasha Huffman's maternal aunt was diagnosed with colon cancer at age 70.    GYNECOLOGIC HISTORY:  No LMP recorded. Patient is postmenopausal. 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 Her husband, [name], works at a [employer] in [place]. 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 Natasha Huffman has two children, Natasha Huffman and Natasha Huffman. Natasha Huffman is 43, lives in [place], [place], and is a Animator. Natasha Huffman is 67, lives in [place], and works for the [employer]. Natasha Huffman has one grandchild. She attends the [employer] in [place]: Natasha Huffman's husband, Natasha Huffman, is automatically her healthcare power of attorney.     HEALTH MAINTENANCE: Social History   Tobacco Use  . Smoking status: Never Smoker  . Smokeless tobacco: Never Used  Substance Use Topics   . Alcohol use: No  . Drug use: No    Colonoscopy: yes, 2015, [facility]  PAP: 09/2017  Bone density: no   No Known Allergies  Current Outpatient Medications  Medication Sig Dispense Refill  . acetaminophen (TYLENOL) 325 MG tablet Take 650 mg by mouth every 6 (six) hours as needed for moderate pain or headache.    . ciprofloxacin (CIPRO) 500 MG tablet Take 1 tablet (500 mg total) by mouth 2 (two) times daily. 14 tablet 4  . dexamethasone (DECADRON) 4 MG tablet Take 2 tablets (8 mg total) by mouth 2 (two) times daily. Start the day before Taxotere. Take once the day after, then 2 times a day x 2d. 30 tablet 1  . fluconazole (DIFLUCAN) 100 MG tablet Take 1 tablet (100 mg total) by mouth daily. 21 tablet 1  . lidocaine-prilocaine (EMLA) cream Apply to affected area once 30 g 3  . LORazepam (ATIVAN) 0.5 MG tablet Take 1 tablet (0.5 mg total) by mouth at bedtime as needed (Nausea or vomiting). 20 tablet 0  . oxyCODONE (OXY IR/ROXICODONE) 5 MG immediate release tablet Take 1 tablet (5 mg total) by mouth every 6 (six) hours as needed for severe pain. 12 tablet 0  . prochlorperazine (COMPAZINE) 10 MG tablet Take 1 tablet (10 mg total) by mouth every  6 (six) hours as needed (Nausea or vomiting). 30 tablet 1   No current facility-administered medications for this visit.      OBJECTIVE:       Wt Readings from Last 3 Encounters:  11/11/18 207 lb 11.2 oz (94.2 kg)  10/29/18 202 lb 4.8 oz (91.8 kg)  10/14/18 207 lb 4.8 oz (94 kg)      ECOG FS: 1 Visit done over [facility].  Patient is well appearing, no acute distress noted.  No skin rash noted.  Breathing is non labored and appears normal.  Sounds well.  Behavior and discussion appropriate, speech non rushed or labored.  Neuro focally intact.     LAB RESULTS:  CMP     Component Value Date/Time   NA 143 11/11/2018 0950   K 4.1 11/11/2018 0950   CL 108 11/11/2018 0950   CO2 26 11/11/2018 0950   GLUCOSE 103 (H) 11/11/2018 0950   BUN 13 11/11/2018  0950   CREATININE 0.78 11/11/2018 0950   CALCIUM 9.1 11/11/2018 0950   PROT 7.2 11/11/2018 0950   ALBUMIN 3.8 11/11/2018 0950   AST 20 11/11/2018 0950   ALT 51 (H) 11/11/2018 0950   ALKPHOS 132 (H) 11/11/2018 0950   BILITOT 0.3 11/11/2018 0950   GFRNONAA >60 11/11/2018 0950   GFRAA >60 11/11/2018 0950    No results found for: TOTALPROTELP, ALBUMINELP, A1GS, A2GS, BETS, BETA2SER, GAMS, MSPIKE, SPEI  No results found for: KPAFRELGTCHN, LAMBDASER, KAPLAMBRATIO  Lab Results  Component Value Date   WBC 6.6 11/11/2018   NEUTROABS 3.9 11/11/2018   HGB 11.2 (L) 11/11/2018   HCT 35.8 (L) 11/11/2018   MCV 86.1 11/11/2018   PLT 229 11/11/2018    '@LASTCHEMISTRY'$ @  No results found for: LABCA2  No components found for: [id number]  No results for input(s): INR in the last 168 hours.  No results found for: LABCA2  No results found for: EQA834  No results found for: HDQ222  No results found for: LNL892  No results found for: CA2729  No components found for: HGQUANT  No results found for: CEA1 / No results found for: CEA1   No results found for: AFPTUMOR  No results found for: CHROMOGRNA  No results found for: PSA1  No visits with results within 3 Day(s) from this visit.  Latest known visit with results is:  Appointment on 11/11/2018  Component Date Value Ref Range Status  . Sodium 11/11/2018 143  135 - 145 mmol/L Final  . Potassium 11/11/2018 4.1  3.5 - 5.1 mmol/L Final  . Chloride 11/11/2018 108  98 - 111 mmol/L Final  . CO2 11/11/2018 26  22 - 32 mmol/L Final  . Glucose, Bld 11/11/2018 103* 70 - 99 mg/dL Final  . BUN 11/11/2018 13  6 - 20 mg/dL Final  . Creatinine 11/11/2018 0.78  0.44 - 1.00 mg/dL Final  . Calcium 11/11/2018 9.1  8.9 - 10.3 mg/dL Final  . Total Protein 11/11/2018 7.2  6.5 - 8.1 g/dL Final  . Albumin 11/11/2018 3.8  3.5 - 5.0 g/dL Final  . AST 11/11/2018 20  15 - 41 U/L Final  . ALT 11/11/2018 51* 0 - 44 U/L Final  . Alkaline Phosphatase  11/11/2018 132* 38 - 126 U/L Final  . Total Bilirubin 11/11/2018 0.3  0.3 - 1.2 mg/dL Final  . GFR, Est Non Af Am 11/11/2018 >60  >60 mL/min Final  . GFR, Est AFR Am 11/11/2018 >60  >60 mL/min Final  . Anion gap 11/11/2018  9  5 - 15 Final   Performed at [facility], [place] [address], [place], [place] [address]  . WBC Count 11/11/2018 6.6  4.0 - 10.5 K/uL Final  . RBC 11/11/2018 4.16  3.87 - 5.11 MIL/uL Final  . Hemoglobin 11/11/2018 11.2* 12.0 - 15.0 g/dL Final  . HCT 11/11/2018 35.8* 36.0 - 46.0 % Final  . MCV 11/11/2018 86.1  80.0 - 100.0 fL Final  . MCH 11/11/2018 26.9  26.0 - 34.0 pg Final  . MCHC 11/11/2018 31.3  30.0 - 36.0 g/dL Final  . RDW 11/11/2018 13.4  11.5 - 15.5 % Final  . Platelet Count 11/11/2018 229  150 - 400 K/uL Final  . nRBC 11/11/2018 0.0  0.0 - 0.2 % Final  . Neutrophils Relative % 11/11/2018 59  % Final  . Neutro Abs 11/11/2018 3.9  1.7 - 7.7 K/uL Final  . Lymphocytes Relative 11/11/2018 31  % Final  . Lymphs Abs 11/11/2018 2.1  0.7 - 4.0 K/uL Final  . Monocytes Relative 11/11/2018 6  % Final  . Monocytes Absolute 11/11/2018 0.4  0.1 - 1.0 K/uL Final  . Eosinophils Relative 11/11/2018 2  % Final  . Eosinophils Absolute 11/11/2018 0.1  0.0 - 0.5 K/uL Final  . Basophils Relative 11/11/2018 1  % Final  . Basophils Absolute 11/11/2018 0.1  0.0 - 0.1 K/uL Final  . Immature Granulocytes 11/11/2018 1  % Final  . Abs Immature Granulocytes 11/11/2018 0.05  0.00 - 0.07 K/uL Final   Performed at [facility], [address] [place], [place] [address]    (this displays the last labs from the last 3 days)  No results found for: TOTALPROTELP, ALBUMINELP, A1GS, A2GS, BETS, BETA2SER, GAMS, MSPIKE, SPEI (this displays SPEP labs)  No results found for: KPAFRELGTCHN, LAMBDASER, KAPLAMBRATIO (kappa/lambda light chains)  No results found for: HGBA, HGBA2QUANT, HGBFQUANT, HGBSQUAN (Hemoglobinopathy evaluation)   No results  found for: LDH  No results found for: IRON, TIBC, IRONPCTSAT (Iron and TIBC)  No results found for: FERRITIN  Urinalysis    Component Value Date/Time   COLORURINE YELLOW 02/22/2015 0118   APPEARANCEUR CLOUDY (A) 02/22/2015 0118   LABSPEC 1.019 02/22/2015 0118   PHURINE 7.0 02/22/2015 0118   GLUCOSEU NEGATIVE 02/22/2015 0118   HGBUR NEGATIVE 02/22/2015 0118   BILIRUBINUR NEGATIVE 02/22/2015 0118   KETONESUR NEGATIVE 02/22/2015 0118   PROTEINUR NEGATIVE 02/22/2015 0118   UROBILINOGEN 1.0 02/22/2015 0118   NITRITE NEGATIVE 02/22/2015 0118   LEUKOCYTESUR NEGATIVE 02/22/2015 0118     STUDIES:  No results found.   ELIGIBLE FOR AVAILABLE RESEARCH PROTOCOL: no   ASSESSMENT: 58 y.o. [name], [place] woman status post left breast upper outer quadrant biopsy 09/23/2018 for a clinical T1c N0, stage IA invasive ductal carcinoma, grade 3, triple positive, with an MIB-1 of 40%  (1) neoadjuvant chemotherapy consisting of carboplatin, docetaxel, and trastuzumab started 10/22/2018, to be repeated every 21 days x 6  (a) echocardiogram on 10/07/2018 shows a LVEF of 60-65%.  (2) definitive surgery to follow  (3) adjuvant radiation  (4) antiestrogens to start at the completion of local treatment    PLAN: Jakyrah tolerated her second cycle of neoadjuvant chemotherapy with Docetaxel, Carboplatin, and Trastuzumab well.  Her nausea has been controlled with her anti emetics.  She has had mild constipation.  Was recommended stool softeners and Miralax if needed.    The issue with her feet sensation does not sound like peripheral neuropathy.  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  She was recommended to apply moisturizer to her feet and hands daily, that is cream based.    Since her nadir check after her first cycle of chemotherapy showed neutropenia, she was prescribed Cipro BID and tolerated it well.  I recommended that she go ahead and restart the Cipro,  as she is likely neutropenic again.  She will do this.    We will see her back in 2 weeks for labs, f/u with myself or Dr. Jana Hakim, and her third cycle of neoadjuvant chemotherapy.    We reviewed pandemic precautions and she and her family appear to be isolating themselves appropriately.  We reviewed in detail that any concerns she has to please call us so that we can evaluate.  We can certainly see her sooner if the need should arise.    Time spent on video visit: 15 minutes  Wilber Bihari, NP 11/25/18 8:49 AM Medical Oncology and Hematology [facility] [address] [place], [place] [address] Tel. [phone]    Fax. [phone]

## 2018-11-26 ENCOUNTER — Other Ambulatory Visit: Payer: Self-pay | Admitting: *Deleted

## 2018-11-26 DIAGNOSIS — Z170 Estrogen receptor positive status [ER+]: Principal | ICD-10-CM

## 2018-11-26 DIAGNOSIS — C50412 Malignant neoplasm of upper-outer quadrant of left female breast: Secondary | ICD-10-CM

## 2018-11-26 MED ORDER — LORAZEPAM 0.5 MG PO TABS: 0.5000 mg | ORAL_TABLET | Freq: Every evening | ORAL | 0 refills | Status: DC | PRN

## 2018-11-27 ENCOUNTER — Other Ambulatory Visit: Payer: Self-pay | Admitting: *Deleted

## 2018-11-27 DIAGNOSIS — C50412 Malignant neoplasm of upper-outer quadrant of left female breast: Secondary | ICD-10-CM

## 2018-11-27 DIAGNOSIS — Z170 Estrogen receptor positive status [ER+]: Principal | ICD-10-CM

## 2018-11-27 MED FILL — LORazepam 0.5 MG TABS: 0.5 | 20 days supply | Qty: 20 | Fill #0

## 2018-11-30 NOTE — Progress Notes (Signed)
[facility]  [phone]) [phone] [phone]    ID: Natasha Huffman DOB: 28-Oct-1960  MR#: [id number]  TSV#:[id number]  Patient Care Team: [name], MD as PCP - General (Family Medicine) Rockwell Germany, RN as Oncology Nurse Navigator Mauro Kaufmann, RN as Oncology Nurse Navigator [name], MD as Consulting Physician (General Surgery) Ramesses Crampton, [name], MD as Consulting Physician (Oncology) Kyung Rudd, MD as Consulting Physician (Radiation Oncology) OTHER MD:    CHIEF COMPLAINT: Triple positive breast cancer  CURRENT TREATMENT: Neoadjuvant chemotherapy   INTERVAL HISTORY: Natasha Huffman returns today for follow-up and treatment of her HER-2 positive breast cancer.   She continues on She is receiving neoadjuvant chemotherapy with Docetaxel, Carboplatin, Trastuzumab given on day 1 of a 21 day cycle with growth factor support with Onpro. Today is day 1 cycle 3. She has some constipation and has noticed some blood on her tissue; she was able to make a normal bowel movement this morning, however. Her energy has been good. She has altered taste; she likes a lot of fruit, particularly citrus foods. She denies any neuropathy symptoms. Her mouth sores from treatment one have resolved and have not returned since.   Since her last visit here, she has not undergone any additional studies.     REVIEW OF SYSTEMS: Natasha Huffman continues to cook and do housework. For exercise, she generally walks for about a mile and a half. As part of COVID-19 concerns, her husband is doing the grocery shopping and taking appropriate precautions.  The patient denies unusual headaches, visual changes, nausea, vomiting, or dizziness. There has been no unusual cough, phlegm production, or pleurisy. This been no change in bladder habits. The patient denies unexplained fatigue or unexplained weight loss, bleeding, rash, or fever. A detailed review of systems was otherwise noncontributory.    HISTORY OF CURRENT ILLNESS: From the original intake note:  Natasha Huffman had routine screening mammography on 09/23/2018 showing a possible abnormality in the left breast. She underwent unilateral left diagnostic mammography with tomography and left breast ultrasonography at [facility] on 09/23/2018 showing: Breast Density Category B. There is a mass with irregular margins in the upper-outer quadrant of the left breast. On physical exam, there is palpable focal thickening in the 2 o'clock location of the left breast. Sonographically, an irregular hypoechoic mass with internal vascularity in the 2 o'clock location of the left breast 10 cm from the nipple is seen. The mass measures 1.8 x 1.1 x 1.0 cm. There is associated posterior acoustic enhancement. Evaluation of the left axilla is negative for adenopathy.  Accordingly on 09/23/2018 she proceeded to biopsy of the left breast area in question. The pathology from this procedure showed ([id number]): invasive ductal carcinoma, grade III. Prognostic indicators significant for: estrogen receptor, 100% positive and progesterone receptor, 2% positive, both with strong staining intensity. Proliferation marker Ki67 at 40%. HER2 positive (3+) by immunohistochemistry.   The patient's subsequent history is as detailed above.   PAST MEDICAL HISTORY: Past Medical History:  Diagnosis Date  . Cancer ([facility]) 09/23/2018   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 ,   . Gallstones   . GERD (gastroesophageal reflux disease)    resolved      PAST SURGICAL HISTORY: Past Surgical History:  Procedure Laterality Date  . [place]; 1995  . CHOLECYSTECTOMY N/A 02/22/2015   Procedure: LAPAROSCOPIC CHOLECYSTECTOMY WITH INTRAOPERATIVE CHOLANGIOGRAM;  Surgeon: [name], MD;  Location: [facility];  Service: General;  Laterality: N/A;  .  LAPAROSCOPIC CHOLECYSTECTOMY  02/22/2015  . PORTACATH PLACEMENT Right 10/08/2018   Procedure: INSERTION PORT-A-CATH WITH  ULTRASOUND;  Surgeon: [name], MD;  Location: [facility];  Service: General;  Laterality: Right;  . TUBAL LIGATION  1995     FAMILY HISTORY: Family History  Problem Relation Age of Onset  . Hypertension Sister   . Colon cancer Maternal Aunt   . Breast cancer Neg Hx    Natasha Huffman's father died from sepsis at age 83. Patients' mother is 61 years old as of 09/2018. The patient has 3 brothers and 5 sisters. Patient denies anyone in her family having breast, ovarian, prostate, or pancreatic cancer. [place]'s maternal aunt was diagnosed with colon cancer at age 66.    GYNECOLOGIC HISTORY:  No LMP recorded. Patient is postmenopausal. 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 Her husband, [name], works at a [employer] in [place]. 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 Natasha Huffman has two children, Natasha Huffman and Natasha Huffman. Natasha Huffman is 74, lives in [place], [place], and is a Animator. Natasha Huffman is 55, lives in [place], and works for the [employer]. Augustine has one grandchild. She attends the [employer] in [place]: Dewana's husband, Natasha Huffman, is automatically her healthcare power of attorney.     HEALTH MAINTENANCE: Social History   Tobacco Use  . Smoking status: Never Smoker  . Smokeless tobacco: Never Used  Substance Use Topics  . Alcohol use: No  . Drug use: No    Colonoscopy: yes, 2015, [facility]  PAP: 09/2017  Bone density: no   No Known Allergies  Current Outpatient Medications  Medication Sig Dispense Refill  . acetaminophen (TYLENOL) 325 MG tablet Take 650 mg by mouth every 6 (six) hours as needed for moderate pain or headache.    . ciprofloxacin (CIPRO) 500 MG tablet Take 1 tablet (500 mg total) by  mouth 2 (two) times daily. 14 tablet 4  . dexamethasone (DECADRON) 4 MG tablet Take 2 tablets (8 mg total) by mouth 2 (two) times daily. Start the day before Taxotere. Take once the day after, then 2 times a day x 2d. 30 tablet 1  . fluconazole (DIFLUCAN) 100 MG tablet Take 1 tablet (100 mg total) by mouth daily. 21 tablet 1  . lidocaine-prilocaine (EMLA) cream Apply to affected area once 30 g 3  . LORazepam (ATIVAN) 0.5 MG tablet Take 1 tablet (0.5 mg total) by mouth at bedtime as needed (Nausea or vomiting). 20 tablet 0  . oxyCODONE (OXY IR/ROXICODONE) 5 MG immediate release tablet Take 1 tablet (5 mg total) by mouth every 6 (six) hours as needed for severe pain. 12 tablet 0  . prochlorperazine (COMPAZINE) 10 MG tablet Take 1 tablet (10 mg total) by mouth every 6 (six) hours as needed (Nausea or vomiting). 30 tablet 1   No current facility-administered medications for this visit.      OBJECTIVE:      Middle-aged African-American woman in no acute distress Wt Readings from Last 3 Encounters:  12/01/18 205 lb 6.4 oz (93.2 kg)  11/11/18 207 lb 11.2 oz (94.2 kg)  10/29/18 202 lb 4.8 oz (91.8 kg)      ECOG FS: 1 Sclerae unicteric, EOMs intact Wearing a mask No  cervical or supraclavicular adenopathy Lungs no rales or rhonchi Heart regular rate and rhythm Abd soft, nontender, positive bowel sounds MSK no focal spinal tenderness, no upper extremity lymphedema Neuro: nonfocal, well oriented, appropriate affect Breasts: The right breast is unremarkable.  I do not palpate a mass in the left breast.  Both axillae are benign.   LAB RESULTS:  CMP     Component Value Date/Time   NA 143 12/01/2018 0822   K 4.1 12/01/2018 0822   CL 111 12/01/2018 0822   CO2 21 (L) 12/01/2018 0822   GLUCOSE 114 (H) 12/01/2018 0822   BUN 13 12/01/2018 0822   CREATININE 0.77 12/01/2018 0822   CALCIUM 9.3 12/01/2018 0822   PROT 7.1 12/01/2018 0822   ALBUMIN 3.7 12/01/2018 0822   AST 14 (L) 12/01/2018 0822    ALT 24 12/01/2018 0822   ALKPHOS 110 12/01/2018 0822   BILITOT 0.3 12/01/2018 0822   GFRNONAA >60 12/01/2018 0822   GFRAA >60 12/01/2018 0822    No results found for: TOTALPROTELP, ALBUMINELP, A1GS, A2GS, BETS, BETA2SER, GAMS, MSPIKE, SPEI  No results found for: KPAFRELGTCHN, LAMBDASER, KAPLAMBRATIO  Lab Results  Component Value Date   WBC 8.3 12/01/2018   NEUTROABS 6.6 12/01/2018   HGB 10.8 (L) 12/01/2018   HCT 34.2 (L) 12/01/2018   MCV 85.9 12/01/2018   PLT 302 12/01/2018    '@LASTCHEMISTRY'$ @  No results found for: LABCA2  No components found for: POEUMP536  No results for input(s): INR in the last 168 hours.  No results found for: LABCA2  No results found for: RWE315  No results found for: QMG867  No results found for: YPP509  No results found for: CA2729  No components found for: HGQUANT  No results found for: CEA1 / No results found for: CEA1   No results found for: AFPTUMOR  No results found for: CHROMOGRNA  No results found for: PSA1  Appointment on 12/01/2018  Component Date Value Ref Range Status  . WBC Count 12/01/2018 8.3  4.0 - 10.5 K/uL Final  . RBC 12/01/2018 3.98  3.87 - 5.11 MIL/uL Final  . Hemoglobin 12/01/2018 10.8* 12.0 - 15.0 g/dL Final  . HCT 12/01/2018 34.2* 36.0 - 46.0 % Final  . MCV 12/01/2018 85.9  80.0 - 100.0 fL Final  . MCH 12/01/2018 27.1  26.0 - 34.0 pg Final  . MCHC 12/01/2018 31.6  30.0 - 36.0 g/dL Final  . RDW 12/01/2018 15.4  11.5 - 15.5 % Final  . Platelet Count 12/01/2018 302  150 - 400 K/uL Final  . nRBC 12/01/2018 0.0  0.0 - 0.2 % Final  . Neutrophils Relative % 12/01/2018 80  % Final  . Neutro Abs 12/01/2018 6.6  1.7 - 7.7 K/uL Final  . Lymphocytes Relative 12/01/2018 13  % Final  . Lymphs Abs 12/01/2018 1.1  0.7 - 4.0 K/uL Final  . Monocytes Relative 12/01/2018 7  % Final  . Monocytes Absolute 12/01/2018 0.6  0.1 - 1.0 K/uL Final  . Eosinophils Relative 12/01/2018 0  % Final  . Eosinophils Absolute 12/01/2018  0.0  0.0 - 0.5 K/uL Final  . Basophils Relative 12/01/2018 0  % Final  . Basophils Absolute 12/01/2018 0.0  0.0 - 0.1 K/uL Final  . Immature Granulocytes 12/01/2018 0  % Final  . Abs Immature Granulocytes 12/01/2018 0.03  0.00 - 0.07 K/uL Final   Performed at [facility], [place] [address] [place], [place] [address]  . Sodium 12/01/2018 143  135 -  145 mmol/L Final  . Potassium 12/01/2018 4.1  3.5 - 5.1 mmol/L Final  . Chloride 12/01/2018 111  98 - 111 mmol/L Final  . CO2 12/01/2018 21* 22 - 32 mmol/L Final  . Glucose, Bld 12/01/2018 114* 70 - 99 mg/dL Final  . BUN 12/01/2018 13  6 - 20 mg/dL Final  . Creatinine 12/01/2018 0.77  0.44 - 1.00 mg/dL Final  . Calcium 12/01/2018 9.3  8.9 - 10.3 mg/dL Final  . Total Protein 12/01/2018 7.1  6.5 - 8.1 g/dL Final  . Albumin 12/01/2018 3.7  3.5 - 5.0 g/dL Final  . AST 12/01/2018 14* 15 - 41 U/L Final  . ALT 12/01/2018 24  0 - 44 U/L Final  . Alkaline Phosphatase 12/01/2018 110  38 - 126 U/L Final  . Total Bilirubin 12/01/2018 0.3  0.3 - 1.2 mg/dL Final  . GFR, Est Non Af Am 12/01/2018 >60  >60 mL/min Final  . GFR, Est AFR Am 12/01/2018 >60  >60 mL/min Final  . Anion gap 12/01/2018 11  5 - 15 Final   Performed at [facility], [address] [place], [place] [address]    (this displays the last labs from the last 3 days)  No results found for: TOTALPROTELP, ALBUMINELP, A1GS, A2GS, BETS, BETA2SER, GAMS, MSPIKE, SPEI (this displays SPEP labs)  No results found for: KPAFRELGTCHN, LAMBDASER, KAPLAMBRATIO (kappa/lambda light chains)  No results found for: HGBA, HGBA2QUANT, HGBFQUANT, HGBSQUAN (Hemoglobinopathy evaluation)   No results found for: LDH  No results found for: IRON, TIBC, IRONPCTSAT (Iron and TIBC)  No results found for: FERRITIN  Urinalysis    Component Value Date/Time   COLORURINE YELLOW 02/22/2015 0118   APPEARANCEUR CLOUDY (A) 02/22/2015 0118   LABSPEC 1.019 02/22/2015  0118   PHURINE 7.0 02/22/2015 0118   GLUCOSEU NEGATIVE 02/22/2015 0118   HGBUR NEGATIVE 02/22/2015 0118   BILIRUBINUR NEGATIVE 02/22/2015 0118   KETONESUR NEGATIVE 02/22/2015 0118   PROTEINUR NEGATIVE 02/22/2015 0118   UROBILINOGEN 1.0 02/22/2015 0118   NITRITE NEGATIVE 02/22/2015 0118   LEUKOCYTESUR NEGATIVE 02/22/2015 0118     STUDIES:  No results found.   ELIGIBLE FOR AVAILABLE RESEARCH PROTOCOL: no   ASSESSMENT: 58 y.o. [name], [place] woman status post left breast upper outer quadrant biopsy 09/23/2018 for a clinical T1c N0, stage IA invasive ductal carcinoma, grade 3, triple positive, with an MIB-1 of 40%  (1) neoadjuvant chemotherapy consisting of carboplatin, docetaxel, and trastuzumab started 10/22/2018, to be repeated every 21 days x 6  (a) echocardiogram on 10/07/2018 shows a LVEF of 60-65%.  (2) definitive surgery to follow  (3) adjuvant radiation  (4) antiestrogens to start at the completion of local treatment    PLAN: Sakura is doing remarkably well with her chemotherapy and we are proceeding to cycle 3 of 6 planned today.  I am going to continue to leave off the perjeta for now, but will consider adding it back on cycle 4  She has cancel her Hajj and I believe this has been canceled and generally this year by [place].  If she needs a letter from me so she can recoup her flight costs she will let me know  I am canceling her visit a week from now since that would only expose her to the pandemic unnecessarily.  She knows to start ciprofloxacin on day 7.  And she also knows to call us for any other symptoms that may develop before the next visit.  She is going  to try to eat more acidic foods such as tomatoes and fruit, and she can consider adding a few drops of lemon juice or even vinegar to other foods to see if that helps with the taste perversion which however she understands will continue until she is done with chemotherapy  I am pleased with the  initial clinical response  She knows to call for any other issue that may develop before her next visit.   Benedicta Sultan, [name], MD  12/01/18 9:02 AM Medical Oncology and Hematology [facility] [address] [place], [place] [address] Tel. [phone]    Fax. [phone]  I, Jacqualyn Posey am acting as a Education administrator for [name], MD.   I, Lurline Del MD, have reviewed the above documentation for accuracy and completeness, and I agree with the above.

## 2018-12-01 ENCOUNTER — Encounter: Payer: Self-pay | Admitting: Oncology

## 2018-12-01 ENCOUNTER — Inpatient Hospital Stay: Payer: 59

## 2018-12-01 ENCOUNTER — Inpatient Hospital Stay (HOSPITAL_BASED_OUTPATIENT_CLINIC_OR_DEPARTMENT_OTHER): Payer: 59 | Admitting: Oncology

## 2018-12-01 ENCOUNTER — Other Ambulatory Visit: Payer: Self-pay

## 2018-12-01 VITALS — BP 133/71 | HR 86 | Temp 97.9°F | Resp 18 | Ht 62.0 in | Wt 205.4 lb

## 2018-12-01 DIAGNOSIS — K5900 Constipation, unspecified: Secondary | ICD-10-CM | POA: Diagnosis not present

## 2018-12-01 DIAGNOSIS — R110 Nausea: Secondary | ICD-10-CM | POA: Diagnosis not present

## 2018-12-01 DIAGNOSIS — Z780 Asymptomatic menopausal state: Secondary | ICD-10-CM | POA: Diagnosis not present

## 2018-12-01 DIAGNOSIS — K219 Gastro-esophageal reflux disease without esophagitis: Secondary | ICD-10-CM

## 2018-12-01 DIAGNOSIS — Z5112 Encounter for antineoplastic immunotherapy: Secondary | ICD-10-CM | POA: Diagnosis not present

## 2018-12-01 DIAGNOSIS — R439 Unspecified disturbances of smell and taste: Secondary | ICD-10-CM

## 2018-12-01 DIAGNOSIS — Z170 Estrogen receptor positive status [ER+]: Secondary | ICD-10-CM

## 2018-12-01 DIAGNOSIS — Z79899 Other long term (current) drug therapy: Secondary | ICD-10-CM

## 2018-12-01 DIAGNOSIS — C50412 Malignant neoplasm of upper-outer quadrant of left female breast: Secondary | ICD-10-CM | POA: Diagnosis not present

## 2018-12-01 DIAGNOSIS — R5383 Other fatigue: Secondary | ICD-10-CM

## 2018-12-01 DIAGNOSIS — Z800 Family history of malignant neoplasm of digestive organs: Secondary | ICD-10-CM

## 2018-12-01 DIAGNOSIS — Z5111 Encounter for antineoplastic chemotherapy: Secondary | ICD-10-CM | POA: Diagnosis not present

## 2018-12-01 DIAGNOSIS — Z7689 Persons encountering health services in other specified circumstances: Secondary | ICD-10-CM | POA: Diagnosis not present

## 2018-12-01 LAB — CMP (CANCER CENTER ONLY)
ALT: 24 U/L (ref 0–44)
AST: 14 U/L — ABNORMAL LOW (ref 15–41)
Albumin: 3.7 g/dL (ref 3.5–5.0)
Alkaline Phosphatase: 110 U/L (ref 38–126)
Anion gap: 11 (ref 5–15)
BUN: 13 mg/dL (ref 6–20)
CO2: 21 mmol/L — ABNORMAL LOW (ref 22–32)
Calcium: 9.3 mg/dL (ref 8.9–10.3)
Chloride: 111 mmol/L (ref 98–111)
Creatinine: 0.77 mg/dL (ref 0.44–1.00)
GFR, Est AFR Am: >60 mL/min (ref >60)
GFR, Estimated: >60 mL/min (ref >60)
Glucose, Bld: 114 mg/dL — ABNORMAL HIGH (ref 70–99)
Potassium: 4.1 mmol/L (ref 3.5–5.1)
Sodium: 143 mmol/L (ref 135–145)
Total Bilirubin: 0.3 mg/dL (ref 0.3–1.2)
Total Protein: 7.1 g/dL (ref 6.5–8.1)

## 2018-12-01 LAB — CBC WITH DIFFERENTIAL (CANCER CENTER ONLY)
Abs Immature Granulocytes: 0.03 10*3/uL (ref 0.00–0.07)
Basophils Absolute: 0 10*3/uL (ref 0.0–0.1)
Basophils Relative: 0 %
Eosinophils Absolute: 0 10*3/uL (ref 0.0–0.5)
Eosinophils Relative: 0 %
HCT: 34.2 % — ABNORMAL LOW (ref 36.0–46.0)
Hemoglobin: 10.8 g/dL — ABNORMAL LOW (ref 12.0–15.0)
Immature Granulocytes: 0 %
Lymphocytes Relative: 13 %
Lymphs Abs: 1.1 10*3/uL (ref 0.7–4.0)
MCH: 27.1 pg (ref 26.0–34.0)
MCHC: 31.6 g/dL (ref 30.0–36.0)
MCV: 85.9 fL (ref 80.0–100.0)
Monocytes Absolute: 0.6 10*3/uL (ref 0.1–1.0)
Monocytes Relative: 7 %
Neutro Abs: 6.6 10*3/uL (ref 1.7–7.7)
Neutrophils Relative %: 80 %
Platelet Count: 302 10*3/uL (ref 150–400)
RBC: 3.98 MIL/uL (ref 3.87–5.11)
RDW: 15.4 % (ref 11.5–15.5)
WBC Count: 8.3 10*3/uL (ref 4.0–10.5)
nRBC: 0 % (ref 0.0–0.2)

## 2018-12-01 MED ORDER — SODIUM CHLORIDE 0.9 % IV SOLN
Freq: Once | INTRAVENOUS | Status: AC
  Administered 2018-12-01: 10:00:00 via INTRAVENOUS
  Filled 2018-12-01: qty 5

## 2018-12-01 MED ORDER — SODIUM CHLORIDE 0.9 % IV SOLN
575.0000 mg | Freq: Once | INTRAVENOUS | Status: AC
  Administered 2018-12-01: 580 mg via INTRAVENOUS
  Filled 2018-12-01: qty 58

## 2018-12-01 MED ORDER — PALONOSETRON HCL INJECTION 0.25 MG/5ML
0.2500 mg | Freq: Once | INTRAVENOUS | Status: AC
  Administered 2018-12-01: 0.25 mg via INTRAVENOUS

## 2018-12-01 MED ORDER — ACETAMINOPHEN 325 MG PO TABS
ORAL_TABLET | ORAL | Status: AC
  Filled 2018-12-01: qty 2

## 2018-12-01 MED ORDER — PEGFILGRASTIM 6 MG/0.6ML ~~LOC~~ PSKT
PREFILLED_SYRINGE | SUBCUTANEOUS | Status: AC
  Filled 2018-12-01: qty 0.6

## 2018-12-01 MED ORDER — SODIUM CHLORIDE 0.9 % IV SOLN
75.0000 mg/m2 | Freq: Once | INTRAVENOUS | Status: AC
  Administered 2018-12-01: 150 mg via INTRAVENOUS
  Filled 2018-12-01: qty 15

## 2018-12-01 MED ORDER — HEPARIN SOD (PORK) LOCK FLUSH 100 UNIT/ML IV SOLN
500.0000 [IU] | Freq: Once | INTRAVENOUS | Status: AC | PRN
  Administered 2018-12-01: 500 [IU]
  Filled 2018-12-01: qty 5

## 2018-12-01 MED ORDER — DIPHENHYDRAMINE HCL 25 MG PO CAPS
25.0000 mg | ORAL_CAPSULE | Freq: Once | ORAL | Status: AC
  Administered 2018-12-01: 25 mg via ORAL

## 2018-12-01 MED ORDER — PALONOSETRON HCL INJECTION 0.25 MG/5ML
INTRAVENOUS | Status: AC
  Filled 2018-12-01: qty 5

## 2018-12-01 MED ORDER — DIPHENHYDRAMINE HCL 25 MG PO CAPS
ORAL_CAPSULE | ORAL | Status: AC
  Filled 2018-12-01: qty 1

## 2018-12-01 MED ORDER — ACETAMINOPHEN 325 MG PO TABS
650.0000 mg | ORAL_TABLET | Freq: Once | ORAL | Status: AC
  Administered 2018-12-01: 650 mg via ORAL

## 2018-12-01 MED ORDER — PEGFILGRASTIM 6 MG/0.6ML ~~LOC~~ PSKT
6.0000 mg | PREFILLED_SYRINGE | Freq: Once | SUBCUTANEOUS | Status: AC
  Administered 2018-12-01: 6 mg via SUBCUTANEOUS

## 2018-12-01 MED ORDER — TRASTUZUMAB CHEMO 150 MG IV SOLR
600.0000 mg | Freq: Once | INTRAVENOUS | Status: AC
  Administered 2018-12-01: 600 mg via INTRAVENOUS
  Filled 2018-12-01: qty 28.57

## 2018-12-01 MED ORDER — SODIUM CHLORIDE 0.9 % IV SOLN
Freq: Once | INTRAVENOUS | Status: AC
  Administered 2018-12-01: 09:00:00 via INTRAVENOUS
  Filled 2018-12-01: qty 250

## 2018-12-01 MED ORDER — SODIUM CHLORIDE 0.9% FLUSH
10.0000 mL | INTRAVENOUS | Status: DC | PRN
  Administered 2018-12-01: 10 mL
  Filled 2018-12-01: qty 10

## 2018-12-01 NOTE — Progress Notes (Signed)
Patient called from infusion in response to me wanting to speak with her at registration.  Introduced myself as Arboriculturist and to offer available resources.  Discussed one-time $1000 Radio broadcast assistant to assist with personal expenses while going through treatment.Patient states her income is over the allowed amount quoted. Advised if anything changes to reach out to me and let me know to revisit. She verbalized understanding.  Discussed available copay assistance through [employer] and [employer] First Step for Neulasta. Patient gave me permission to apply on her behalf online and needed information.  Patient approved through [employer] for copay assistance for treatment-related drugs 12/01/18 through 12/01/19 with a lookback date of 06/04/18. Approval is for  guaranteed award of $2500 and if need exceeds, access to additional $13,500,is available without re-application or paperwork. Total is $16,000 after insurance pays. Copy of approval letter and POE will be given to [facility] for billing/copay submissions. Copy will be mailed to the patient for her records only.  Enrolled patient in [place] for Neulasta. Patient approved for maximum benefit of $10,000 which will cover the first injection at 100% and each additional injection would only leave her with a $5 copay after insurance. Copay card given to [facility] for billing/copay submissions.  Also applied on her behalf for Herceptin and Perjeta through [place] assistance. Patient approved for $25,000 for Herceptin which will only leave her with a $5 copay after insurance pays. Patient also approved for $25,000 for Perjeta which will only leave her with a $5 copay after insurance pays. I will give Lenise the approval letters for billing/copay submissions. She will receive a copy of both approval letters as well for her records only.   Patient has my card for any additional financial questions or concerns. She stopped by my office  to meet in person and thanked me for my assistance.

## 2018-12-01 NOTE — Patient Instructions (Signed)
[facility] Discharge Instructions for Patients Receiving Chemotherapy  Today you received the following chemotherapy agents:  Herceptin, Taxotere, and Carboplatin.  To help prevent nausea and vomiting after your treatment, we encourage you to take your nausea medication as directed.   If you develop nausea and vomiting that is not controlled by your nausea medication, call the clinic.   BELOW ARE SYMPTOMS THAT SHOULD BE REPORTED IMMEDIATELY:  *FEVER GREATER THAN 100.5 F  *CHILLS WITH OR WITHOUT FEVER  NAUSEA AND VOMITING THAT IS NOT CONTROLLED WITH YOUR NAUSEA MEDICATION  *UNUSUAL SHORTNESS OF BREATH  *UNUSUAL BRUISING OR BLEEDING  TENDERNESS IN MOUTH AND THROAT WITH OR WITHOUT PRESENCE OF ULCERS  *URINARY PROBLEMS  *BOWEL PROBLEMS  UNUSUAL RASH Items with * indicate a potential emergency and should be followed up as soon as possible.  Feel free to call the clinic should you have any questions or concerns. The clinic phone number is [phone].  Please show the [facility] at check-in to the Emergency Department and triage nurse.  Coronavirus (COVID-19) Are you at risk?  Are you at risk for the Coronavirus (COVID-19)?  To be considered HIGH RISK for Coronavirus (COVID-19), you have to meet the following criteria:  . Traveled to [place], [place], [place], [place] or [place]; or in the [place] to [place], [place], [place], or [place]; and have fever, cough, and shortness of breath within the last 2 weeks of travel OR . Been in close contact with a person diagnosed with COVID-19 within the last 2 weeks and have fever, cough, and shortness of breath . IF YOU DO NOT MEET THESE CRITERIA, YOU ARE CONSIDERED LOW RISK FOR COVID-19.  What to do if you are HIGH RISK for COVID-19?  Marland Kitchen If you are having a medical emergency, call 911. . Seek medical care right away. Before you go to a doctor's office, urgent care or emergency department, call ahead  and tell them about your recent travel, contact with someone diagnosed with COVID-19, and your symptoms. You should receive instructions from your physician's office regarding next steps of care.  . When you arrive at healthcare provider, tell the healthcare staff immediately you have returned from visiting [place], [place], [place], [place] or [place]; or traveled in the [place] to [place], [place], [place], or [place]; in the last two weeks or you have been in close contact with a person diagnosed with COVID-19 in the last 2 weeks.   . Tell the health care staff about your symptoms: fever, cough and shortness of breath. . After you have been seen by a medical provider, you will be either: o Tested for (COVID-19) and discharged home on quarantine except to seek medical care if symptoms worsen, and asked to  - Stay home and avoid contact with others until you get your results (4-5 days)  - Avoid travel on public transportation if possible (such as bus, train, or airplane) or o Sent to the Emergency Department by EMS for evaluation, COVID-19 testing, and possible admission depending on your condition and test results.  What to do if you are LOW RISK for COVID-19?  Reduce your risk of any infection by using the same precautions used for avoiding the common cold or flu:  Marland Kitchen Wash your hands often with soap and warm water for at least 20 seconds.  If soap and water are not readily available, use an alcohol-based hand sanitizer with at least 60% alcohol.  Marland Kitchen  If coughing or sneezing, cover your mouth and nose by coughing or sneezing into the elbow areas of your shirt or coat, into a tissue or into your sleeve (not your hands). . Avoid shaking hands with others and consider head nods or verbal greetings only. . Avoid touching your eyes, nose, or mouth with unwashed hands.  . Avoid close contact with people who are sick. . Avoid places or events with large numbers of people in one location, like  concerts or sporting events. . Carefully consider travel plans you have or are making. . If you are planning any travel outside or inside the [place], visit the CDC's Travelers' Health webpage for the latest health notices. . If you have some symptoms but not all symptoms, continue to monitor at home and seek medical attention if your symptoms worsen. . If you are having a medical emergency, call 911.   ADDITIONAL HEALTHCARE OPTIONS FOR PATIENTS  [facility] / e-Visit: [email or web address]         [facility]: [phone]  [facility]: [phone]                   [facility]: [phone]   

## 2018-12-01 NOTE — Progress Notes (Signed)
Spoke w/ Dr. Jana Hakim - do not increase carboplatin dose for renal function/AUC=5, keep dose at 580mg  which is what patient has been getting. She is responding well to this dose.   For future carboplatin doses, Dr. Jana Hakim would like to keep patient's maximum carboplatin dose of 580mg . Note placed in treatment plan.   Demetrius Charity, PharmD, [facility] Pharmacist Pharmacy Phone: [phone] 12/01/2018

## 2018-12-02 ENCOUNTER — Other Ambulatory Visit: Payer: 59

## 2018-12-02 ENCOUNTER — Ambulatory Visit: Payer: 59 | Admitting: Oncology

## 2018-12-03 ENCOUNTER — Ambulatory Visit: Payer: 59

## 2018-12-05 ENCOUNTER — Ambulatory Visit: Payer: 59

## 2018-12-10 ENCOUNTER — Other Ambulatory Visit: Payer: 59

## 2018-12-10 ENCOUNTER — Ambulatory Visit: Payer: 59 | Admitting: Oncology

## 2018-12-14 ENCOUNTER — Telehealth: Payer: Self-pay | Admitting: *Deleted

## 2018-12-14 NOTE — Telephone Encounter (Signed)
"  [facility] [phone]) calling to ask if I may take Tylenol or Ibuprofen?"

## 2018-12-22 ENCOUNTER — Other Ambulatory Visit: Payer: Self-pay | Admitting: Oncology

## 2018-12-22 ENCOUNTER — Ambulatory Visit (HOSPITAL_BASED_OUTPATIENT_CLINIC_OR_DEPARTMENT_OTHER): Payer: 59 | Admitting: Oncology

## 2018-12-22 ENCOUNTER — Inpatient Hospital Stay: Payer: 59 | Attending: Oncology

## 2018-12-22 ENCOUNTER — Other Ambulatory Visit: Payer: Self-pay

## 2018-12-22 ENCOUNTER — Inpatient Hospital Stay: Payer: 59

## 2018-12-22 ENCOUNTER — Other Ambulatory Visit: Payer: Self-pay | Admitting: *Deleted

## 2018-12-22 VITALS — BP 142/80 | HR 90 | Temp 98.0°F | Resp 18 | Ht 62.0 in | Wt 204.2 lb

## 2018-12-22 DIAGNOSIS — Z79899 Other long term (current) drug therapy: Secondary | ICD-10-CM | POA: Insufficient documentation

## 2018-12-22 DIAGNOSIS — C50412 Malignant neoplasm of upper-outer quadrant of left female breast: Secondary | ICD-10-CM | POA: Diagnosis not present

## 2018-12-22 DIAGNOSIS — D701 Agranulocytosis secondary to cancer chemotherapy: Secondary | ICD-10-CM | POA: Insufficient documentation

## 2018-12-22 DIAGNOSIS — T451X5A Adverse effect of antineoplastic and immunosuppressive drugs, initial encounter: Secondary | ICD-10-CM | POA: Insufficient documentation

## 2018-12-22 DIAGNOSIS — Z170 Estrogen receptor positive status [ER+]: Secondary | ICD-10-CM

## 2018-12-22 DIAGNOSIS — Z5112 Encounter for antineoplastic immunotherapy: Secondary | ICD-10-CM | POA: Diagnosis not present

## 2018-12-22 DIAGNOSIS — Z5189 Encounter for other specified aftercare: Secondary | ICD-10-CM | POA: Insufficient documentation

## 2018-12-22 DIAGNOSIS — Z803 Family history of malignant neoplasm of breast: Secondary | ICD-10-CM

## 2018-12-22 DIAGNOSIS — Z5111 Encounter for antineoplastic chemotherapy: Secondary | ICD-10-CM | POA: Diagnosis not present

## 2018-12-22 LAB — CBC WITH DIFFERENTIAL (CANCER CENTER ONLY)
Abs Immature Granulocytes: 0.02 10*3/uL (ref 0.00–0.07)
Basophils Absolute: 0 10*3/uL (ref 0.0–0.1)
Basophils Relative: 0 %
Eosinophils Absolute: 0.1 10*3/uL (ref 0.0–0.5)
Eosinophils Relative: 2 %
HCT: 33.1 % — ABNORMAL LOW (ref 36.0–46.0)
Hemoglobin: 10.2 g/dL — ABNORMAL LOW (ref 12.0–15.0)
Immature Granulocytes: 0 %
Lymphocytes Relative: 16 %
Lymphs Abs: 0.9 10*3/uL (ref 0.7–4.0)
MCH: 27.5 pg (ref 26.0–34.0)
MCHC: 30.8 g/dL (ref 30.0–36.0)
MCV: 89.2 fL (ref 80.0–100.0)
Monocytes Absolute: 0.2 10*3/uL (ref 0.1–1.0)
Monocytes Relative: 4 %
Neutro Abs: 4.2 10*3/uL (ref 1.7–7.7)
Neutrophils Relative %: 78 %
Platelet Count: 259 10*3/uL (ref 150–400)
RBC: 3.71 MIL/uL — ABNORMAL LOW (ref 3.87–5.11)
RDW: 17.8 % — ABNORMAL HIGH (ref 11.5–15.5)
WBC Count: 5.5 10*3/uL (ref 4.0–10.5)
nRBC: 0 % (ref 0.0–0.2)

## 2018-12-22 LAB — CMP (CANCER CENTER ONLY)
ALT: 33 U/L (ref 0–44)
AST: 18 U/L (ref 15–41)
Albumin: 3.9 g/dL (ref 3.5–5.0)
Alkaline Phosphatase: 103 U/L (ref 38–126)
Anion gap: 9 (ref 5–15)
BUN: 12 mg/dL (ref 6–20)
CO2: 22 mmol/L (ref 22–32)
Calcium: 8.9 mg/dL (ref 8.9–10.3)
Chloride: 110 mmol/L (ref 98–111)
Creatinine: 0.76 mg/dL (ref 0.44–1.00)
GFR, Est AFR Am: >60 mL/min (ref >60)
GFR, Estimated: >60 mL/min (ref >60)
Glucose, Bld: 119 mg/dL — ABNORMAL HIGH (ref 70–99)
Potassium: 4.1 mmol/L (ref 3.5–5.1)
Sodium: 141 mmol/L (ref 135–145)
Total Bilirubin: 0.2 mg/dL — ABNORMAL LOW (ref 0.3–1.2)
Total Protein: 6.9 g/dL (ref 6.5–8.1)

## 2018-12-22 MED ORDER — TRASTUZUMAB CHEMO 150 MG IV SOLR
600.0000 mg | Freq: Once | INTRAVENOUS | Status: AC
  Administered 2018-12-22: 11:00:00 600 mg via INTRAVENOUS
  Filled 2018-12-22: qty 28.57

## 2018-12-22 MED ORDER — DIPHENHYDRAMINE HCL 25 MG PO CAPS
ORAL_CAPSULE | ORAL | Status: AC
  Filled 2018-12-22: qty 1

## 2018-12-22 MED ORDER — PEGFILGRASTIM 6 MG/0.6ML ~~LOC~~ PSKT
PREFILLED_SYRINGE | SUBCUTANEOUS | Status: AC
  Filled 2018-12-22: qty 0.6

## 2018-12-22 MED ORDER — HEPARIN SOD (PORK) LOCK FLUSH 100 UNIT/ML IV SOLN
500.0000 [IU] | Freq: Once | INTRAVENOUS | Status: AC | PRN
  Administered 2018-12-22: 500 [IU]
  Filled 2018-12-22: qty 5

## 2018-12-22 MED ORDER — PEGFILGRASTIM 6 MG/0.6ML ~~LOC~~ PSKT
6.0000 mg | PREFILLED_SYRINGE | Freq: Once | SUBCUTANEOUS | Status: AC
  Administered 2018-12-22: 6 mg via SUBCUTANEOUS

## 2018-12-22 MED ORDER — SODIUM CHLORIDE 0.9 % IV SOLN
575.0000 mg | Freq: Once | INTRAVENOUS | Status: AC
  Administered 2018-12-22: 580 mg via INTRAVENOUS
  Filled 2018-12-22: qty 58

## 2018-12-22 MED ORDER — SODIUM CHLORIDE 0.9 % IV SOLN
75.0000 mg/m2 | Freq: Once | INTRAVENOUS | Status: AC
  Administered 2018-12-22: 150 mg via INTRAVENOUS
  Filled 2018-12-22: qty 15

## 2018-12-22 MED ORDER — SODIUM CHLORIDE 0.9% FLUSH
10.0000 mL | INTRAVENOUS | Status: DC | PRN
  Administered 2018-12-22: 10 mL
  Filled 2018-12-22: qty 10

## 2018-12-22 MED ORDER — PALONOSETRON HCL INJECTION 0.25 MG/5ML
0.2500 mg | Freq: Once | INTRAVENOUS | Status: AC
  Administered 2018-12-22: 0.25 mg via INTRAVENOUS

## 2018-12-22 MED ORDER — DIPHENHYDRAMINE HCL 25 MG PO CAPS
25.0000 mg | ORAL_CAPSULE | Freq: Once | ORAL | Status: AC
  Administered 2018-12-22: 25 mg via ORAL

## 2018-12-22 MED ORDER — ACETAMINOPHEN 325 MG PO TABS
ORAL_TABLET | ORAL | Status: AC
  Filled 2018-12-22: qty 2

## 2018-12-22 MED ORDER — PALONOSETRON HCL INJECTION 0.25 MG/5ML
INTRAVENOUS | Status: AC
  Filled 2018-12-22: qty 5

## 2018-12-22 MED ORDER — ACETAMINOPHEN 325 MG PO TABS
650.0000 mg | ORAL_TABLET | Freq: Once | ORAL | Status: AC
  Administered 2018-12-22: 650 mg via ORAL

## 2018-12-22 MED ORDER — SODIUM CHLORIDE 0.9 % IV SOLN
Freq: Once | INTRAVENOUS | Status: AC
  Administered 2018-12-22: 11:00:00 via INTRAVENOUS
  Filled 2018-12-22: qty 5

## 2018-12-22 MED ORDER — SODIUM CHLORIDE 0.9 % IV SOLN
Freq: Once | INTRAVENOUS | Status: AC
  Administered 2018-12-22: 10:00:00 via INTRAVENOUS
  Filled 2018-12-22: qty 250

## 2018-12-22 NOTE — Patient Instructions (Signed)
[facility] Discharge Instructions for Patients Receiving Chemotherapy  Today you received the following chemotherapy agents:  Herceptin, Taxotere, and Carboplatin, & Neulasta Onpro.  To help prevent nausea and vomiting after your treatment, we encourage you to take your nausea medication as directed.   If you develop nausea and vomiting that is not controlled by your nausea medication, call the clinic.   BELOW ARE SYMPTOMS THAT SHOULD BE REPORTED IMMEDIATELY:  *FEVER GREATER THAN 100.5 F  *CHILLS WITH OR WITHOUT FEVER  NAUSEA AND VOMITING THAT IS NOT CONTROLLED WITH YOUR NAUSEA MEDICATION  *UNUSUAL SHORTNESS OF BREATH  *UNUSUAL BRUISING OR BLEEDING  TENDERNESS IN MOUTH AND THROAT WITH OR WITHOUT PRESENCE OF ULCERS  *URINARY PROBLEMS  *BOWEL PROBLEMS  UNUSUAL RASH Items with * indicate a potential emergency and should be followed up as soon as possible.  Feel free to call the clinic should you have any questions or concerns. The clinic phone number is [phone].  Please show the [facility] at check-in to the Emergency Department and triage nurse.  Coronavirus (COVID-19) Are you at risk?  Are you at risk for the Coronavirus (COVID-19)?  To be considered HIGH RISK for Coronavirus (COVID-19), you have to meet the following criteria:  . Traveled to [place], [place], [place], [place] or [place]; or in the [place] to [place], [place], [place], or [place]; and have fever, cough, and shortness of breath within the last 2 weeks of travel OR . Been in close contact with a person diagnosed with COVID-19 within the last 2 weeks and have fever, cough, and shortness of breath . IF YOU DO NOT MEET THESE CRITERIA, YOU ARE CONSIDERED LOW RISK FOR COVID-19.  What to do if you are HIGH RISK for COVID-19?  Marland Kitchen If you are having a medical emergency, call 911. . Seek medical care right away. Before you go to a doctor's office, urgent care or emergency  department, call ahead and tell them about your recent travel, contact with someone diagnosed with COVID-19, and your symptoms. You should receive instructions from your physician's office regarding next steps of care.  . When you arrive at healthcare provider, tell the healthcare staff immediately you have returned from visiting [place], [place], [place], [place] or [place]; or traveled in the [place] to [place], [place], [place], or [place]; in the last two weeks or you have been in close contact with a person diagnosed with COVID-19 in the last 2 weeks.   . Tell the health care staff about your symptoms: fever, cough and shortness of breath. . After you have been seen by a medical provider, you will be either: o Tested for (COVID-19) and discharged home on quarantine except to seek medical care if symptoms worsen, and asked to  - Stay home and avoid contact with others until you get your results (4-5 days)  - Avoid travel on public transportation if possible (such as bus, train, or airplane) or o Sent to the Emergency Department by EMS for evaluation, COVID-19 testing, and possible admission depending on your condition and test results.  What to do if you are LOW RISK for COVID-19?  Reduce your risk of any infection by using the same precautions used for avoiding the common cold or flu:  Marland Kitchen Wash your hands often with soap and warm water for at least 20 seconds.  If soap and water are not readily available, use an alcohol-based hand sanitizer with at least 60%  alcohol.  . If coughing or sneezing, cover your mouth and nose by coughing or sneezing into the elbow areas of your shirt or coat, into a tissue or into your sleeve (not your hands). . Avoid shaking hands with others and consider head nods or verbal greetings only. . Avoid touching your eyes, nose, or mouth with unwashed hands.  . Avoid close contact with people who are sick. . Avoid places or events with large numbers of people  in one location, like concerts or sporting events. . Carefully consider travel plans you have or are making. . If you are planning any travel outside or inside the [place], visit the CDC's Travelers' Health webpage for the latest health notices. . If you have some symptoms but not all symptoms, continue to monitor at home and seek medical attention if your symptoms worsen. . If you are having a medical emergency, call 911.   [facility] / e-Visit: [email or web address]         [facility]: [facility]: [phone]                   [facility]: [phone]

## 2018-12-22 NOTE — Progress Notes (Signed)
[facility]  [phone] [phone]    ID: Natasha Huffman DOB: [birth year]  MR#: [id number]  URK#:[id number]  Patient Care Team: [name], MD as PCP - General (Family Medicine) Rockwell Germany, RN as Oncology Nurse Navigator Mauro Kaufmann, RN as Oncology Nurse Navigator [name], MD as Consulting Physician (General Surgery) [name], [name], MD as Consulting Physician (Oncology) Kyung Rudd, MD as Consulting Physician (Radiation Oncology) OTHER MD:    CHIEF COMPLAINT: Triple positive breast cancer  CURRENT TREATMENT: Neoadjuvant chemotherapy   INTERVAL HISTORY: Natasha Huffman returns today for follow-up and treatment of her HER-2 positive breast cancer.   She is receiving neoadjuvant chemotherapy with Docetaxel, Carboplatin, Trastuzumab given on day 1 of a 21 day cycle with growth factor support with Onpro. Today is day 1 cycle 4.   She is tolerating treatment "fine".  On days 4 or 5 she feels a little bit of a slump.  She has been able to continue to work from home.  She has only had to take off 1 day so far.  Since her last visit here, she has not undergone any additional studies.  Her last echocardiogram was at the end of February   REVIEW OF SYSTEMS: Natasha Huffman has had some nausea but no vomiting.  She gets a bit revved up from the steroids for anti-emetics and cannot sleep.  She has been using some lorazepam with variable results.  She had mild diarrhea,.  She was taking some stool softeners because of hemorrhoid issues and that all seems a bit better.  For exercise she walks outside.  She and her husband are keeping appropriate pandemic precautions  Detailed review of systems today was otherwise stable  HISTORY OF CURRENT ILLNESS: From the original intake note:  Natasha Huffman had routine screening mammography on 09/23/2018 showing a possible abnormality in the left breast. She underwent unilateral left diagnostic mammography with  tomography and left breast ultrasonography at [facility] on 09/23/2018 showing: Breast Density Category B. There is a mass with irregular margins in the upper-outer quadrant of the left breast. On physical exam, there is palpable focal thickening in the 2 o'clock location of the left breast. Sonographically, an irregular hypoechoic mass with internal vascularity in the 2 o'clock location of the left breast 10 cm from the nipple is seen. The mass measures 1.8 x 1.1 x 1.0 cm. There is associated posterior acoustic enhancement. Evaluation of the left axilla is negative for adenopathy.  Accordingly on 09/23/2018 she proceeded to biopsy of the left breast area in question. The pathology from this procedure showed ([id number]): invasive ductal carcinoma, grade III. Prognostic indicators significant for: estrogen receptor, 100% positive and progesterone receptor, 2% positive, both with strong staining intensity. Proliferation marker Ki67 at 40%. HER2 positive (3+) by immunohistochemistry.   The patient's subsequent history is as detailed above.   PAST MEDICAL HISTORY: Past Medical History:  Diagnosis Date  . Cancer ([facility]) 09/23/2018   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 ,   . Gallstones   . GERD (gastroesophageal reflux disease)    resolved      PAST SURGICAL HISTORY: Past Surgical History:  Procedure Laterality Date  . [place]; 1995  . CHOLECYSTECTOMY N/A 02/22/2015   Procedure: LAPAROSCOPIC CHOLECYSTECTOMY WITH INTRAOPERATIVE CHOLANGIOGRAM;  Surgeon: [name], MD;  Location: [facility];  Service: General;  Laterality: N/A;  . LAPAROSCOPIC CHOLECYSTECTOMY  02/22/2015  . PORTACATH PLACEMENT Right 10/08/2018   Procedure: INSERTION PORT-A-CATH WITH  ULTRASOUND;  Surgeon: [name], MD;  Location: [facility];  Service: General;  Laterality: Right;  . TUBAL LIGATION  1995     FAMILY HISTORY: Family History  Problem Relation Age of Onset  . Hypertension Sister   . Colon  cancer Maternal Aunt   . Breast cancer Neg Hx    Natasha Huffman's father died from sepsis at age 63. Patients' mother is 21 years old as of 09/2018. The patient has 3 brothers and 5 sisters. Patient denies anyone in her family having breast, ovarian, prostate, or pancreatic cancer. Natasha Huffman's maternal aunt was diagnosed with colon cancer at age 31.    GYNECOLOGIC HISTORY:  No LMP recorded. Patient is postmenopausal. 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 Her husband, [name], works at a [employer] in [place]. 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 Natasha Huffman has two children, Natasha Huffman and Natasha Huffman. Natasha Huffman is 49, lives in [place], [place], and is a Animator. Natasha Huffman is 54, lives in [place], and works for the [employer]. Rahima has one grandchild. She attends the [employer] in [place]: Natasha Huffman's husband, [name], is automatically her healthcare power of attorney.     HEALTH MAINTENANCE: Social History   Tobacco Use  . Smoking status: Never Smoker  . Smokeless tobacco: Never Used  Substance Use Topics  . Alcohol use: No  . Drug use: No    Colonoscopy: yes, 2015, [facility]  PAP: 09/2017  Bone density: no   No Known Allergies  Current Outpatient Medications  Medication Sig Dispense Refill  . acetaminophen (TYLENOL) 325 MG tablet Take 650 mg by mouth every 6 (six) hours as needed for moderate pain or headache.    . ciprofloxacin (CIPRO) 500 MG tablet Take 1 tablet (500 mg total) by mouth 2 (two) times daily. 14 tablet 4  . dexamethasone (DECADRON) 4 MG tablet Take 2 tablets (8 mg total) by mouth 2 (two) times daily. Start the day before Taxotere. Take once the day after, then 2 times a day x 2d. 30 tablet 1   . fluconazole (DIFLUCAN) 100 MG tablet Take 1 tablet (100 mg total) by mouth daily. 21 tablet 1  . lidocaine-prilocaine (EMLA) cream Apply to affected area once 30 g 3  . LORazepam (ATIVAN) 0.5 MG tablet Take 1 tablet (0.5 mg total) by mouth at bedtime as needed (Nausea or vomiting). 20 tablet 0  . oxyCODONE (OXY IR/ROXICODONE) 5 MG immediate release tablet Take 1 tablet (5 mg total) by mouth every 6 (six) hours as needed for severe pain. 12 tablet 0  . prochlorperazine (COMPAZINE) 10 MG tablet Take 1 tablet (10 mg total) by mouth every 6 (six) hours as needed (Nausea or vomiting). 30 tablet 1   No current facility-administered medications for this visit.      OBJECTIVE:      Middle-aged African-American woman in no acute distress Vitals:   12/22/18 0855  BP: (!) 142/80  Pulse: 90  Resp: 18  Temp: 98 F (36.7 C)  SpO2: 100%   Body mass index is 37.35 kg/m.  Wt Readings from Last 3 Encounters:  12/22/18 204 lb 3.2 oz (92.6 kg)  12/01/18 205 lb 6.4 oz (93.2 kg)  11/11/18 207 lb 11.2 oz (94.2  kg)      ECOG FS: 1  Sclerae unicteric, pupils round and equal Wearing a mask No cervical or supraclavicular adenopathy Lungs no rales or rhonchi Heart regular rate and rhythm Abd soft, nontender, positive bowel sounds MSK no focal spinal tenderness, no upper extremity lymphedema Neuro: nonfocal, well oriented, appropriate affect Breasts: Deferred    LAB RESULTS:  CMP     Component Value Date/Time   NA 143 12/01/2018 0822   K 4.1 12/01/2018 0822   CL 111 12/01/2018 0822   CO2 21 (L) 12/01/2018 0822   GLUCOSE 114 (H) 12/01/2018 0822   BUN 13 12/01/2018 0822   CREATININE 0.77 12/01/2018 0822   CALCIUM 9.3 12/01/2018 0822   PROT 7.1 12/01/2018 0822   ALBUMIN 3.7 12/01/2018 0822   AST 14 (L) 12/01/2018 0822   ALT 24 12/01/2018 0822   ALKPHOS 110 12/01/2018 0822   BILITOT 0.3 12/01/2018 0822   GFRNONAA >60 12/01/2018 0822   GFRAA >60 12/01/2018 0822    No results found  for: TOTALPROTELP, ALBUMINELP, A1GS, A2GS, BETS, BETA2SER, GAMS, MSPIKE, SPEI  No results found for: KPAFRELGTCHN, LAMBDASER, KAPLAMBRATIO  Lab Results  Component Value Date   WBC 8.3 12/01/2018   NEUTROABS 6.6 12/01/2018   HGB 10.8 (L) 12/01/2018   HCT 34.2 (L) 12/01/2018   MCV 85.9 12/01/2018   PLT 302 12/01/2018    _0 @  No results found for: LABCA2  No components found for: SVXBLT903  No results for input(s): INR in the last 168 hours.  No results found for: LABCA2  No results found for: ESP233  No results found for: AQT622  No results found for: QJF354  No results found for: CA2729  No components found for: HGQUANT  No results found for: CEA1 / No results found for: CEA1   No results found for: AFPTUMOR  No results found for: CHROMOGRNA  No results found for: PSA1  No visits with results within 3 Day(s) from this visit.  Latest known visit with results is:  Appointment on 12/01/2018  Component Date Value Ref Range Status  . WBC Count 12/01/2018 8.3  4.0 - 10.5 K/uL Final  . RBC 12/01/2018 3.98  3.87 - 5.11 MIL/uL Final  . Hemoglobin 12/01/2018 10.8* 12.0 - 15.0 g/dL Final  . HCT 12/01/2018 34.2* 36.0 - 46.0 % Final  . MCV 12/01/2018 85.9  80.0 - 100.0 fL Final  . MCH 12/01/2018 27.1  26.0 - 34.0 pg Final  . MCHC 12/01/2018 31.6  30.0 - 36.0 g/dL Final  . RDW 12/01/2018 15.4  11.5 - 15.5 % Final  . Platelet Count 12/01/2018 302  150 - 400 K/uL Final  . nRBC 12/01/2018 0.0  0.0 - 0.2 % Final  . Neutrophils Relative % 12/01/2018 80  % Final  . Neutro Abs 12/01/2018 6.6  1.7 - 7.7 K/uL Final  . Lymphocytes Relative 12/01/2018 13  % Final  . Lymphs Abs 12/01/2018 1.1  0.7 - 4.0 K/uL Final  . Monocytes Relative 12/01/2018 7  % Final  . Monocytes Absolute 12/01/2018 0.6  0.1 - 1.0 K/uL Final  . Eosinophils Relative 12/01/2018 0  % Final  . Eosinophils Absolute 12/01/2018 0.0  0.0 - 0.5 K/uL Final  . Basophils Relative 12/01/2018 0  % Final  .  Basophils Absolute 12/01/2018 0.0  0.0 - 0.1 K/uL Final  . Immature Granulocytes 12/01/2018 0  % Final  . Abs Immature Granulocytes 12/01/2018 0.03  0.00 - 0.07 K/uL Final   Performed at  [facility], [place] [address] [place], [place] [address]  . Sodium 12/01/2018 143  135 - 145 mmol/L Final  . Potassium 12/01/2018 4.1  3.5 - 5.1 mmol/L Final  . Chloride 12/01/2018 111  98 - 111 mmol/L Final  . CO2 12/01/2018 21* 22 - 32 mmol/L Final  . Glucose, Bld 12/01/2018 114* 70 - 99 mg/dL Final  . BUN 12/01/2018 13  6 - 20 mg/dL Final  . Creatinine 12/01/2018 0.77  0.44 - 1.00 mg/dL Final  . Calcium 12/01/2018 9.3  8.9 - 10.3 mg/dL Final  . Total Protein 12/01/2018 7.1  6.5 - 8.1 g/dL Final  . Albumin 12/01/2018 3.7  3.5 - 5.0 g/dL Final  . AST 12/01/2018 14* 15 - 41 U/L Final  . ALT 12/01/2018 24  0 - 44 U/L Final  . Alkaline Phosphatase 12/01/2018 110  38 - 126 U/L Final  . Total Bilirubin 12/01/2018 0.3  0.3 - 1.2 mg/dL Final  . GFR, Est Non Af Am 12/01/2018 >60  >60 mL/min Final  . GFR, Est AFR Am 12/01/2018 >60  >60 mL/min Final  . Anion gap 12/01/2018 11  5 - 15 Final   Performed at [facility], [place] [address] [place], [place] [address]    (this displays the last labs from the last 3 days)  No results found for: TOTALPROTELP, ALBUMINELP, A1GS, A2GS, BETS, BETA2SER, GAMS, MSPIKE, SPEI (this displays SPEP labs)  No results found for: KPAFRELGTCHN, LAMBDASER, KAPLAMBRATIO (kappa/lambda light chains)  No results found for: HGBA, HGBA2QUANT, HGBFQUANT, HGBSQUAN (Hemoglobinopathy evaluation)   No results found for: LDH  No results found for: IRON, TIBC, IRONPCTSAT (Iron and TIBC)  No results found for: FERRITIN  Urinalysis    Component Value Date/Time   COLORURINE YELLOW 02/22/2015 0118   APPEARANCEUR CLOUDY (A) 02/22/2015 0118   LABSPEC 1.019 02/22/2015 0118   PHURINE 7.0 02/22/2015 0118   GLUCOSEU NEGATIVE 02/22/2015 0118    HGBUR NEGATIVE 02/22/2015 0118   BILIRUBINUR NEGATIVE 02/22/2015 0118   KETONESUR NEGATIVE 02/22/2015 0118   PROTEINUR NEGATIVE 02/22/2015 0118   UROBILINOGEN 1.0 02/22/2015 0118   NITRITE NEGATIVE 02/22/2015 0118   LEUKOCYTESUR NEGATIVE 02/22/2015 0118     STUDIES:  No results found.   ELIGIBLE FOR AVAILABLE RESEARCH PROTOCOL: no   ASSESSMENT: 58 y.o. [name], [place] woman status post left breast upper outer quadrant biopsy 09/23/2018 for a clinical T1c N0, stage IA invasive ductal carcinoma, grade 3, triple positive, with an MIB-1 of 40%  (1) neoadjuvant chemotherapy consisting of carboplatin, docetaxel, and trastuzumab started 10/22/2018, to be repeated every 21 days x 6  (a) echocardiogram on 10/07/2018 shows a LVEF of 60-65%.  (2) definitive surgery to follow  (3) adjuvant radiation  (4) antiestrogens to start at the completion of local treatment    PLAN: Casi thought the treatment was today instead of oh 5/14 which is when she was actually scheduled.  Luckily we had a cancellation and were able to work her in.  She is tolerating chemotherapy well and will receive her fourth of 6 cycles today.  She understands she will need to continue the trastuzumab to complete a full year.  She will be due for repeat echocardiogram later this month.  I do not think she has seen 1 of our cardio oncologists yet.  They are keeping appropriate COVID precautions and she is trying to continue to exercise.  She has a return appointment next week just to check nadir counts.  She will return in 3 weeks for her fifth cycle of chemo.   She knows to call for any other issues that may develop before then.   [name], [name], MD  12/22/18 9:15 AM Medical Oncology and Hematology [facility] [address] [place], [place] [address] Tel. [phone]    Fax. [phone]   I, Wilburn Mylar, am acting as scribe for Dr. [name]. [name].  I, Lurline Del MD,  have reviewed the above documentation for accuracy and completeness, and I agree with the above.

## 2018-12-23 ENCOUNTER — Other Ambulatory Visit: Payer: Self-pay | Admitting: Oncology

## 2018-12-23 DIAGNOSIS — C50412 Malignant neoplasm of upper-outer quadrant of left female breast: Secondary | ICD-10-CM

## 2018-12-23 DIAGNOSIS — Z170 Estrogen receptor positive status [ER+]: Secondary | ICD-10-CM

## 2018-12-23 MED FILL — LORazepam 0.5 MG TABS: 0.5 | 20 days supply | Qty: 20 | Fill #0

## 2018-12-23 MED FILL — FLUCONAZOLE 100 MG TAB: 100 | 21 days supply | Qty: 21 | Fill #0

## 2018-12-24 ENCOUNTER — Inpatient Hospital Stay: Payer: 59 | Admitting: Oncology

## 2018-12-24 ENCOUNTER — Inpatient Hospital Stay: Payer: 59

## 2018-12-26 ENCOUNTER — Ambulatory Visit: Payer: 59

## 2018-12-30 ENCOUNTER — Other Ambulatory Visit: Payer: Self-pay | Admitting: Adult Health

## 2018-12-30 DIAGNOSIS — D701 Agranulocytosis secondary to cancer chemotherapy: Secondary | ICD-10-CM

## 2018-12-30 DIAGNOSIS — C50412 Malignant neoplasm of upper-outer quadrant of left female breast: Secondary | ICD-10-CM

## 2018-12-31 ENCOUNTER — Inpatient Hospital Stay (HOSPITAL_BASED_OUTPATIENT_CLINIC_OR_DEPARTMENT_OTHER): Payer: 59 | Admitting: Adult Health

## 2018-12-31 ENCOUNTER — Encounter: Payer: Self-pay | Admitting: Adult Health

## 2018-12-31 ENCOUNTER — Other Ambulatory Visit: Payer: Self-pay

## 2018-12-31 ENCOUNTER — Inpatient Hospital Stay: Payer: 59

## 2018-12-31 VITALS — BP 120/75 | HR 88 | Temp 98.2°F | Resp 18 | Ht 62.0 in | Wt 199.3 lb

## 2018-12-31 DIAGNOSIS — Z170 Estrogen receptor positive status [ER+]: Secondary | ICD-10-CM | POA: Diagnosis not present

## 2018-12-31 DIAGNOSIS — Z79899 Other long term (current) drug therapy: Secondary | ICD-10-CM

## 2018-12-31 DIAGNOSIS — C50412 Malignant neoplasm of upper-outer quadrant of left female breast: Secondary | ICD-10-CM | POA: Diagnosis not present

## 2018-12-31 DIAGNOSIS — Z5189 Encounter for other specified aftercare: Secondary | ICD-10-CM | POA: Diagnosis not present

## 2018-12-31 DIAGNOSIS — Z5112 Encounter for antineoplastic immunotherapy: Secondary | ICD-10-CM | POA: Diagnosis not present

## 2018-12-31 DIAGNOSIS — Z803 Family history of malignant neoplasm of breast: Secondary | ICD-10-CM

## 2018-12-31 DIAGNOSIS — T451X5A Adverse effect of antineoplastic and immunosuppressive drugs, initial encounter: Secondary | ICD-10-CM

## 2018-12-31 DIAGNOSIS — D701 Agranulocytosis secondary to cancer chemotherapy: Secondary | ICD-10-CM

## 2018-12-31 DIAGNOSIS — Z5111 Encounter for antineoplastic chemotherapy: Secondary | ICD-10-CM | POA: Diagnosis not present

## 2018-12-31 LAB — CMP (CANCER CENTER ONLY)
ALT: 27 U/L (ref 0–44)
AST: 15 U/L (ref 15–41)
Albumin: 3.8 g/dL (ref 3.5–5.0)
Alkaline Phosphatase: 164 U/L — ABNORMAL HIGH (ref 38–126)
Anion gap: 8 (ref 5–15)
BUN: 9 mg/dL (ref 6–20)
CO2: 27 mmol/L (ref 22–32)
Calcium: 8.8 mg/dL — ABNORMAL LOW (ref 8.9–10.3)
Chloride: 105 mmol/L (ref 98–111)
Creatinine: 0.78 mg/dL (ref 0.44–1.00)
GFR, Est AFR Am: >60 mL/min (ref >60)
GFR, Estimated: >60 mL/min (ref >60)
Glucose, Bld: 101 mg/dL — ABNORMAL HIGH (ref 70–99)
Potassium: 4 mmol/L (ref 3.5–5.1)
Sodium: 140 mmol/L (ref 135–145)
Total Bilirubin: 0.2 mg/dL — ABNORMAL LOW (ref 0.3–1.2)
Total Protein: 6.5 g/dL (ref 6.5–8.1)

## 2018-12-31 LAB — CBC WITH DIFFERENTIAL (CANCER CENTER ONLY)
Abs Immature Granulocytes: 1.86 10*3/uL — ABNORMAL HIGH (ref 0.00–0.07)
Basophils Absolute: 0.1 10*3/uL (ref 0.0–0.1)
Basophils Relative: 0 %
Eosinophils Absolute: 0 10*3/uL (ref 0.0–0.5)
Eosinophils Relative: 0 %
HCT: 34.7 % — ABNORMAL LOW (ref 36.0–46.0)
Hemoglobin: 10.5 g/dL — ABNORMAL LOW (ref 12.0–15.0)
Immature Granulocytes: 8 %
Lymphocytes Relative: 13 %
Lymphs Abs: 3.1 10*3/uL (ref 0.7–4.0)
MCH: 28.1 pg (ref 26.0–34.0)
MCHC: 30.3 g/dL (ref 30.0–36.0)
MCV: 92.8 fL (ref 80.0–100.0)
Monocytes Absolute: 1.2 10*3/uL — ABNORMAL HIGH (ref 0.1–1.0)
Monocytes Relative: 5 %
Neutro Abs: 16.7 10*3/uL — ABNORMAL HIGH (ref 1.7–7.7)
Neutrophils Relative %: 74 %
Platelet Count: 186 10*3/uL (ref 150–400)
RBC: 3.74 MIL/uL — ABNORMAL LOW (ref 3.87–5.11)
RDW: 17.9 % — ABNORMAL HIGH (ref 11.5–15.5)
WBC Count: 22.9 10*3/uL — ABNORMAL HIGH (ref 4.0–10.5)
nRBC: 0.1 % (ref 0.0–0.2)

## 2018-12-31 NOTE — Progress Notes (Signed)
[place]  [phone] [phone]    ID: Natasha Huffman DOB: [birth year]  MR#: [id number]  PQD#:[id number]  Patient Care Team: [name], MD as PCP - General (Family Medicine) Rockwell Germany, RN as Oncology Nurse Navigator Mauro Kaufmann, RN as Oncology Nurse Navigator [name], MD as Consulting Physician (General Surgery) [name], [name], MD as Consulting Physician (Oncology) Kyung Rudd, MD as Consulting Physician (Radiation Oncology) Larey Dresser, MD as Consulting Physician (Cardiology) OTHER MD:    CHIEF COMPLAINT: Triple positive breast cancer  CURRENT TREATMENT: Neoadjuvant chemotherapy   INTERVAL HISTORY: Natasha Huffman returns today for follow-up and treatment of her HER-2 positive breast cancer.   She is receiving neoadjuvant chemotherapy with Docetaxel, Carboplatin, Trastuzumab given on day 1 of a 21 day cycle with growth factor support with Onpro. Today is day 8 cycle 4.   She has not yet undergone repeat echocardiogram.  Her last echo was on 10/07/2018, we placed a referral to cardio oncology for patient on 10/14/2018, no appointment has been scheduled.     REVIEW OF SYSTEMS: Natasha Huffman is doing well.  She has been more fatigued following her cycle 4 and had to take more breaks when cooking.  She has some mild intermittent numbness in her heels that she noted the Sunday following her treatment.  She hasn't had any numbness/tingling in her fingertips or toes.  She has continued to work.  She has intermittent diarrhea that resolves with water intake and imodium.    Natasha Huffman denies any fever or chills.  She hasn't noted unusual headaches.  She is without mucositis, dysphagia, nausea, vomiting, bowel/bladder changes.  She has noted a taste change but has continued to eat despite this to get nutrients.  She is without cough, shortness of breath, chest pain, or palpitations.  A detailed ROS was otherwise non contributory.     HISTORY OF CURRENT ILLNESS: From the original intake note:  Natasha Huffman had routine screening mammography on 09/23/2018 showing a possible abnormality in the left breast. She underwent unilateral left diagnostic mammography with tomography and left breast ultrasonography at [facility] on 09/23/2018 showing: Breast Density Category B. There is a mass with irregular margins in the upper-outer quadrant of the left breast. On physical exam, there is palpable focal thickening in the 2 o'clock location of the left breast. Sonographically, an irregular hypoechoic mass with internal vascularity in the 2 o'clock location of the left breast 10 cm from the nipple is seen. The mass measures 1.8 x 1.1 x 1.0 cm. There is associated posterior acoustic enhancement. Evaluation of the left axilla is negative for adenopathy.  Accordingly on 09/23/2018 she proceeded to biopsy of the left breast area in question. The pathology from this procedure showed ([id number]): invasive ductal carcinoma, grade III. Prognostic indicators significant for: estrogen receptor, 100% positive and progesterone receptor, 2% positive, both with strong staining intensity. Proliferation marker Ki67 at 40%. HER2 positive (3+) by immunohistochemistry.   The patient's subsequent history is as detailed above.   PAST MEDICAL HISTORY: Past Medical History:  Diagnosis Date  . Cancer ([place]) 09/23/2018   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 ,   . Gallstones   . GERD (gastroesophageal reflux disease)    resolved      PAST SURGICAL HISTORY: Past Surgical History:  Procedure Laterality Date  . [place]; 1995  . CHOLECYSTECTOMY N/A 02/22/2015   Procedure: LAPAROSCOPIC CHOLECYSTECTOMY WITH INTRAOPERATIVE CHOLANGIOGRAM;  Surgeon: [name], MD;  Location: [facility];  Service: General;  Laterality: N/A;  . LAPAROSCOPIC CHOLECYSTECTOMY  02/22/2015  . PORTACATH PLACEMENT Right 10/08/2018   Procedure: INSERTION PORT-A-CATH WITH  ULTRASOUND;  Surgeon: [name], MD;  Location: [facility];  Service: General;  Laterality: Right;  . TUBAL LIGATION  1995     FAMILY HISTORY: Family History  Problem Relation Age of Onset  . Hypertension Sister   . Colon cancer Maternal Aunt   . Breast cancer Neg Hx    Tiajah's father died from sepsis at age 38. Patients' mother is 77 years old as of 09/2018. The patient has 3 brothers and 5 sisters. Patient denies anyone in her family having breast, ovarian, prostate, or pancreatic cancer. Ronella's maternal aunt was diagnosed with colon cancer at age 53.    GYNECOLOGIC HISTORY:  No LMP recorded. Patient is postmenopausal. 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 Her husband, [name], works at a [employer] in [place]. 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 Natasha Huffman has two children, [name] and [name]. Natasha Huffman is 65, lives in [place], [place], and is a Animator. Natasha Huffman is 89, lives in [place], and works for the [employer]. Natasha Huffman has one grandchild. She attends the [employer] in [place]: Natasha Huffman's husband, Natasha Huffman, is automatically her healthcare power of attorney.     HEALTH MAINTENANCE: Social History   Tobacco Use  . Smoking status: Never Smoker  . Smokeless tobacco: Never Used  Substance Use Topics  . Alcohol use: No  . Drug use: No    Colonoscopy: yes, 2015, [facility]  PAP: 09/2017  Bone density: no   No Known Allergies  Current Outpatient Medications  Medication Sig Dispense Refill  . ciprofloxacin (CIPRO) 500 MG tablet Take 1 tablet (500 mg total) by mouth 2 (two) times daily. 14 tablet 4  . dexamethasone (DECADRON) 4 MG tablet Take 2 tablets (8 mg total) by mouth 2 (two)  times daily. Start the day before Taxotere. Take once the day after, then 2 times a day x 2d. 30 tablet 1  . fluconazole (DIFLUCAN) 100 MG tablet Take 1 tablet (100 mg total) by mouth daily. 21 tablet 1  . lidocaine-prilocaine (EMLA) cream Apply to affected area once 30 g 3  . LORazepam (ATIVAN) 0.5 MG tablet TAKE 1 TABLET BY MOUTH AT BEDTIME AS NEEDED (NAUSEA OR VOMITING) 20 tablet 0  . prochlorperazine (COMPAZINE) 10 MG tablet Take 1 tablet (10 mg total) by mouth every 6 (six) hours as needed (Nausea or vomiting). 30 tablet 1   No current facility-administered medications for this visit.      OBJECTIVE:     Vitals:   12/31/18 1314  BP: 120/75  Pulse: 88  Resp: 18  Temp: 98.2 F (36.8 C)  SpO2: 100%   Body mass index is 36.45 kg/m.  Wt Readings from Last 3 Encounters:  12/31/18 199 lb 4.8 oz (90.4 kg)  12/22/18 204 lb 3.2 oz (92.6 kg)  12/01/18 205 lb 6.4 oz (93.2 kg)  ECOG FS: 1 GENERAL: Patient is a well appearing female in no acute distress HEENT:  Sclerae anicteric.  Oropharynx clear and moist. No ulcerations or evidence of oropharyngeal candidiasis. Neck is supple.  NODES:  No cervical,  supraclavicular, or axillary lymphadenopathy palpated.  BREAST EXAM:  Deferred. LUNGS:  Clear to auscultation bilaterally.  No wheezes or rhonchi. HEART:  Regular rate and rhythm. No murmur appreciated. ABDOMEN:  Soft, nontender.  Positive, normoactive bowel sounds. No organomegaly palpated. MSK:  No focal spinal tenderness to palpation. Full range of motion bilaterally in the upper extremities. EXTREMITIES:  No peripheral edema.   SKIN:  Clear with no obvious rashes or skin changes. No nail dyscrasia. NEURO:  Nonfocal. Well oriented.  Appropriate affect.     LAB RESULTS:  CMP     Component Value Date/Time   NA 141 12/22/2018 0916   K 4.1 12/22/2018 0916   CL 110 12/22/2018 0916   CO2 22 12/22/2018 0916   GLUCOSE 119 (H) 12/22/2018 0916   BUN 12 12/22/2018 0916   CREATININE  0.76 12/22/2018 0916   CALCIUM 8.9 12/22/2018 0916   PROT 6.9 12/22/2018 0916   ALBUMIN 3.9 12/22/2018 0916   AST 18 12/22/2018 0916   ALT 33 12/22/2018 0916   ALKPHOS 103 12/22/2018 0916   BILITOT 0.2 (L) 12/22/2018 0916   GFRNONAA >60 12/22/2018 0916   GFRAA >60 12/22/2018 0916    No results found for: TOTALPROTELP, ALBUMINELP, A1GS, A2GS, BETS, BETA2SER, GAMS, MSPIKE, SPEI  No results found for: KPAFRELGTCHN, LAMBDASER, KAPLAMBRATIO  Lab Results  Component Value Date   WBC 22.9 (H) 12/31/2018   NEUTROABS PENDING 12/31/2018   HGB 10.5 (L) 12/31/2018   HCT 34.7 (L) 12/31/2018   MCV 92.8 12/31/2018   PLT 186 12/31/2018    _0 @  No results found for: LABCA2  No components found for: PYKDXI338  No results for input(s): INR in the last 168 hours.  No results found for: LABCA2  No results found for: SNK539  No results found for: JQB341  No results found for: PFX902  No results found for: CA2729  No components found for: HGQUANT  No results found for: CEA1 / No results found for: CEA1   No results found for: AFPTUMOR  No results found for: CHROMOGRNA  No results found for: PSA1  Appointment on 12/31/2018  Component Date Value Ref Range Status  . WBC Count 12/31/2018 22.9* 4.0 - 10.5 K/uL Final  . RBC 12/31/2018 3.74* 3.87 - 5.11 MIL/uL Final  . Hemoglobin 12/31/2018 10.5* 12.0 - 15.0 g/dL Final  . HCT 12/31/2018 34.7* 36.0 - 46.0 % Final  . MCV 12/31/2018 92.8  80.0 - 100.0 fL Final  . MCH 12/31/2018 28.1  26.0 - 34.0 pg Final  . MCHC 12/31/2018 30.3  30.0 - 36.0 g/dL Final  . RDW 12/31/2018 17.9* 11.5 - 15.5 % Final  . Platelet Count 12/31/2018 186  150 - 400 K/uL Final  . nRBC 12/31/2018 0.1  0.0 - 0.2 % Final   Performed at [facility], [place] [address], [place], [place] [address]  . Neutrophils Relative % 12/31/2018 PENDING  % Incomplete  . Neutro Abs 12/31/2018 PENDING  1.7 - 7.7 K/uL Incomplete  . Band  Neutrophils 12/31/2018 PENDING  % Incomplete  . Lymphocytes Relative 12/31/2018 PENDING  % Incomplete  . Lymphs Abs 12/31/2018 PENDING  0.7 - 4.0 K/uL Incomplete  . Monocytes Relative 12/31/2018 PENDING  % Incomplete  . Monocytes Absolute 12/31/2018 PENDING  0.1 - 1.0 K/uL Incomplete  . Eosinophils Relative 12/31/2018 PENDING  % Incomplete  . Eosinophils Absolute 12/31/2018 PENDING  0.0 - 0.5 K/uL Incomplete  . Basophils Relative 12/31/2018 PENDING  % Incomplete  . Basophils  Absolute 12/31/2018 PENDING  0.0 - 0.1 K/uL Incomplete  . WBC Morphology 12/31/2018 PENDING   Incomplete  . RBC Morphology 12/31/2018 PENDING   Incomplete  . Smear Review 12/31/2018 PENDING   Incomplete  . Other 12/31/2018 PENDING  % Incomplete  . nRBC 12/31/2018 PENDING  0 /100 WBC Incomplete  . Metamyelocytes Relative 12/31/2018 PENDING  % Incomplete  . Myelocytes 12/31/2018 PENDING  % Incomplete  . Promyelocytes Relative 12/31/2018 PENDING  % Incomplete  . Blasts 12/31/2018 PENDING  % Incomplete    (this displays the last labs from the last 3 days)  No results found for: TOTALPROTELP, ALBUMINELP, A1GS, A2GS, BETS, BETA2SER, GAMS, MSPIKE, SPEI (this displays SPEP labs)  No results found for: KPAFRELGTCHN, LAMBDASER, KAPLAMBRATIO (kappa/lambda light chains)  No results found for: HGBA, HGBA2QUANT, HGBFQUANT, HGBSQUAN (Hemoglobinopathy evaluation)   No results found for: LDH  No results found for: IRON, TIBC, IRONPCTSAT (Iron and TIBC)  No results found for: FERRITIN  Urinalysis    Component Value Date/Time   COLORURINE YELLOW 02/22/2015 0118   APPEARANCEUR CLOUDY (A) 02/22/2015 0118   LABSPEC 1.019 02/22/2015 0118   PHURINE 7.0 02/22/2015 0118   GLUCOSEU NEGATIVE 02/22/2015 0118   HGBUR NEGATIVE 02/22/2015 0118   BILIRUBINUR NEGATIVE 02/22/2015 0118   KETONESUR NEGATIVE 02/22/2015 0118   PROTEINUR NEGATIVE 02/22/2015 0118   UROBILINOGEN 1.0 02/22/2015 0118   NITRITE NEGATIVE 02/22/2015 0118    LEUKOCYTESUR NEGATIVE 02/22/2015 0118     STUDIES:  No results found.   ELIGIBLE FOR AVAILABLE RESEARCH PROTOCOL: no   ASSESSMENT: 58 y.o. [name], [place] woman status post left breast upper outer quadrant biopsy 09/23/2018 for a clinical T1c N0, stage IA invasive ductal carcinoma, grade 3, triple positive, with an MIB-1 of 40%  (1) neoadjuvant chemotherapy consisting of carboplatin, docetaxel, and trastuzumab started 10/22/2018, to be repeated every 21 days x 6  (a) echocardiogram on 10/07/2018 shows a LVEF of 60-65%.  (2) definitive surgery to follow  (3) adjuvant radiation  (4) antiestrogens to start at the completion of local treatment    PLAN: Sausha is doing well today.  Her CBC is stable and she doesn't appear to be neutropenic.  She tolerated her fourth cycle of neoadjuvant treatment moderately well.  I want her to monitor the numbness in her heels over the next week.  She may need to change from Docetaxel to Gemcitabine.  If the numbness is still present next week she will call us and I will discuss with Dr. Jana Hakim the plan for cycles 5 and 6 of her treatment.    Deysha is keeping appropriate COVID precautions and she was recommended to continue to exercise.    She will return in 2 weeks for her fifth cycle of chemo.   She knows to call for any other issues that may develop before then.  A total of (20) minutes of face-to-face time was spent with this patient with greater than 50% of that time in counseling and care-coordination.   Wilber Bihari, NP  12/31/18 1:25 PM Medical Oncology and Hematology [facility] [address] [place],  [address] Tel. [phone]    Fax. [phone]

## 2019-01-01 ENCOUNTER — Telehealth: Payer: Self-pay | Admitting: Adult Health

## 2019-01-01 NOTE — Telephone Encounter (Signed)
Called regarding schedule 6/2 was at capacity

## 2019-01-07 ENCOUNTER — Other Ambulatory Visit: Payer: Self-pay | Admitting: Oncology

## 2019-01-07 NOTE — Progress Notes (Signed)
[place]  [phone] Fax:([phone]) [phone]    ID: Natasha Huffman DOB: 1961/07/25  MR#: [id number]  BSJ#:[id number]  Patient Care Team: [name], MD as PCP - General (Family Medicine) Rockwell Germany, RN as Oncology Nurse Navigator Mauro Kaufmann, RN as Oncology Nurse Navigator [name], MD as Consulting Physician (General Surgery) [name], [name], MD as Consulting Physician (Oncology) Kyung Rudd, MD as Consulting Physician (Radiation Oncology) Larey Dresser, MD as Consulting Physician (Cardiology) OTHER MD:    CHIEF COMPLAINT: Triple positive breast cancer  CURRENT TREATMENT: Neoadjuvant chemotherapy   INTERVAL HISTORY: Natasha Huffman returns today for follow-up and treatment of her HER-2 positive breast cancer.   She is receiving neoadjuvant chemotherapy with Docetaxel, Carboplatin, Trastuzumab given on day 1 of a 21 day cycle with growth factor support with Onpro. Today is day 1 cycle 5. She reports feeling "zapped" on days 3 and 4. She also notes numbness to her heels.  She has not yet undergone repeat echocardiogram.  Her last echo was on 10/07/2018, we placed a referral to cardio oncology for patient on 10/14/2018, no appointment has been scheduled.     REVIEW OF SYSTEMS: Coralynn reports doing pretty well overall. She does not have a follow up appointment with Dr. Brantley Stage. She notes some mild vision changes since diagnosis. She has been walking some, but she's had to walk around her house due to the recent rainy weather. She notes she works from home. She celebrated her grandson's first birthday at a park.   The patient denies unusual headaches, nausea, vomiting, stiff neck, dizziness, or gait imbalance. There has been no cough, phlegm production, or pleurisy, no chest pain or pressure, and no change in bowel or bladder habits. The patient denies fever, rash, bleeding, unexplained fatigue or unexplained weight loss. A detailed review of  systems was otherwise entirely negative.   HISTORY OF CURRENT ILLNESS: From the original intake note:  Natasha Huffman had routine screening mammography on 09/23/2018 showing a possible abnormality in the left breast. She underwent unilateral left diagnostic mammography with tomography and left breast ultrasonography at [facility] on 09/23/2018 showing: Breast Density Category B. There is a mass with irregular margins in the upper-outer quadrant of the left breast. On physical exam, there is palpable focal thickening in the 2 o'clock location of the left breast. Sonographically, an irregular hypoechoic mass with internal vascularity in the 2 o'clock location of the left breast 10 cm from the nipple is seen. The mass measures 1.8 x 1.1 x 1.0 cm. There is associated posterior acoustic enhancement. Evaluation of the left axilla is negative for adenopathy.  Accordingly on 09/23/2018 she proceeded to biopsy of the left breast area in question. The pathology from this procedure showed ([id number]): invasive ductal carcinoma, grade III. Prognostic indicators significant for: estrogen receptor, 100% positive and progesterone receptor, 2% positive, both with strong staining intensity. Proliferation marker Ki67 at 40%. HER2 positive (3+) by immunohistochemistry.   The patient's subsequent history is as detailed above.   PAST MEDICAL HISTORY: Past Medical History:  Diagnosis Date  . Cancer ([place]) 09/23/2018   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 ,   . Gallstones   . GERD (gastroesophageal reflux disease)    resolved      PAST SURGICAL HISTORY: Past Surgical History:  Procedure Laterality Date  . [place]; 1995  . CHOLECYSTECTOMY N/A 02/22/2015   Procedure: LAPAROSCOPIC CHOLECYSTECTOMY WITH INTRAOPERATIVE CHOLANGIOGRAM;  Surgeon: [name], MD;  Location: [facility];  Service: General;  Laterality: N/A;  . LAPAROSCOPIC CHOLECYSTECTOMY  02/22/2015  . PORTACATH PLACEMENT Right 10/08/2018    Procedure: INSERTION PORT-A-CATH WITH ULTRASOUND;  Surgeon: [name], MD;  Location: [facility];  Service: General;  Laterality: Right;  . TUBAL LIGATION  1995     FAMILY HISTORY: Family History  Problem Relation Age of Onset  . Hypertension Sister   . Colon cancer Maternal Aunt   . Breast cancer Neg Hx    Cookie's father died from sepsis at age 38. Patients' mother is 2 years old as of 09/2018. The patient has 3 brothers and 5 sisters. Patient denies anyone in her family having breast, ovarian, prostate, or pancreatic cancer. [name]'s maternal aunt was diagnosed with colon cancer at age 54.    GYNECOLOGIC HISTORY:  No LMP recorded. Patient is postmenopausal. 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 Her husband, [name], works at a [employer] in [place]. 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 [name] has two children, [name] and [name]. [name] is 29, lives in [place], [place], and is a Animator. [name] is 31, lives in [place], and works for the [employer]. Tatjana has one grandchild. She attends the [employer] in [place]: Andreia's husband, [name], is automatically her healthcare power of attorney.     HEALTH MAINTENANCE: Social History   Tobacco Use  . Smoking status: Never Smoker  . Smokeless tobacco: Never Used  Substance Use Topics  . Alcohol use: No  . Drug use: No    Colonoscopy: yes, 2015, [facility]  PAP: 09/2017  Bone density: no   No Known Allergies  Current Outpatient Medications  Medication Sig Dispense Refill  . ciprofloxacin (CIPRO) 500 MG tablet Take 1 tablet (500 mg total) by mouth 2 (two) times daily. 14 tablet 4  . dexamethasone (DECADRON) 4 MG tablet Take 2  tablets (8 mg total) by mouth 2 (two) times daily. Start the day before Taxotere. Take once the day after, then 2 times a day x 2d. 30 tablet 1  . fluconazole (DIFLUCAN) 100 MG tablet Take 1 tablet (100 mg total) by mouth daily. 21 tablet 1  . lidocaine-prilocaine (EMLA) cream Apply to affected area once 30 g 3  . LORazepam (ATIVAN) 0.5 MG tablet TAKE 1 TABLET BY MOUTH AT BEDTIME AS NEEDED (NAUSEA OR VOMITING) 20 tablet 0  . prochlorperazine (COMPAZINE) 10 MG tablet Take 1 tablet (10 mg total) by mouth every 6 (six) hours as needed (Nausea or vomiting). 30 tablet 1   No current facility-administered medications for this visit.      OBJECTIVE:    Middle-aged African-American woman who appears well Vitals:   01/11/19 0828  BP: 118/69  Pulse: 94  Resp: 18  Temp: 98.2 F (36.8 C)  SpO2: 100%   Body mass index is 37.86 kg/m.  Wt Readings from Last 3 Encounters:  01/11/19 207 lb (93.9 kg)  12/31/18 199 lb 4.8 oz (90.4 kg)  12/22/18 204 lb 3.2 oz (92.6 kg)  ECOG FS: 1  Sclerae unicteric, EOMs intact Wearing a mask No cervical or supraclavicular adenopathy Lungs no rales or rhonchi Heart regular rate and rhythm Abd soft, nontender, positive bowel sounds MSK no  focal spinal tenderness, no upper extremity lymphedema Neuro: nonfocal, well oriented, appropriate affect Breasts: I do not palpate a mass in the left breast   LAB RESULTS:  CMP     Component Value Date/Time   NA 140 12/31/2018 1252   K 4.0 12/31/2018 1252   CL 105 12/31/2018 1252   CO2 27 12/31/2018 1252   GLUCOSE 101 (H) 12/31/2018 1252   BUN 9 12/31/2018 1252   CREATININE 0.78 12/31/2018 1252   CALCIUM 8.8 (L) 12/31/2018 1252   PROT 6.5 12/31/2018 1252   ALBUMIN 3.8 12/31/2018 1252   AST 15 12/31/2018 1252   ALT 27 12/31/2018 1252   ALKPHOS 164 (H) 12/31/2018 1252   BILITOT 0.2 (L) 12/31/2018 1252   GFRNONAA >60 12/31/2018 1252   GFRAA >60 12/31/2018 1252    No results found for: TOTALPROTELP,  ALBUMINELP, A1GS, A2GS, BETS, BETA2SER, GAMS, MSPIKE, SPEI  No results found for: KPAFRELGTCHN, LAMBDASER, KAPLAMBRATIO  Lab Results  Component Value Date   WBC 2.9 (L) 01/11/2019   NEUTROABS 1.5 (L) 01/11/2019   HGB 9.8 (L) 01/11/2019   HCT 31.9 (L) 01/11/2019   MCV 92.2 01/11/2019   PLT 186 01/11/2019    '@LASTCHEMISTRY'$ @  No results found for: LABCA2  No components found for: TDDUKG254  No results for input(s): INR in the last 168 hours.  No results found for: LABCA2  No results found for: YHC623  No results found for: JSE831  No results found for: DVV616  No results found for: CA2729  No components found for: HGQUANT  No results found for: CEA1 / No results found for: CEA1   No results found for: AFPTUMOR  No results found for: CHROMOGRNA  No results found for: PSA1  Appointment on 01/11/2019  Component Date Value Ref Range Status  . WBC Count 01/11/2019 2.9* 4.0 - 10.5 K/uL Final  . RBC 01/11/2019 3.46* 3.87 - 5.11 MIL/uL Final  . Hemoglobin 01/11/2019 9.8* 12.0 - 15.0 g/dL Final  . HCT 01/11/2019 31.9* 36.0 - 46.0 % Final  . MCV 01/11/2019 92.2  80.0 - 100.0 fL Final  . MCH 01/11/2019 28.3  26.0 - 34.0 pg Final  . MCHC 01/11/2019 30.7  30.0 - 36.0 g/dL Final  . RDW 01/11/2019 17.7* 11.5 - 15.5 % Final  . Platelet Count 01/11/2019 186  150 - 400 K/uL Final  . nRBC 01/11/2019 0.0  0.0 - 0.2 % Final  . Neutrophils Relative % 01/11/2019 51  % Final  . Neutro Abs 01/11/2019 1.5* 1.7 - 7.7 K/uL Final  . Lymphocytes Relative 01/11/2019 36  % Final  . Lymphs Abs 01/11/2019 1.1  0.7 - 4.0 K/uL Final  . Monocytes Relative 01/11/2019 12  % Final  . Monocytes Absolute 01/11/2019 0.4  0.1 - 1.0 K/uL Final  . Eosinophils Relative 01/11/2019 0  % Final  . Eosinophils Absolute 01/11/2019 0.0  0.0 - 0.5 K/uL Final  . Basophils Relative 01/11/2019 1  % Final  . Basophils Absolute 01/11/2019 0.0  0.0 - 0.1 K/uL Final  . Immature Granulocytes 01/11/2019 0  % Final  .  Abs Immature Granulocytes 01/11/2019 0.01  0.00 - 0.07 K/uL Final   Performed at [facility], [place] [address] [place], [place] [address]    (this displays the last labs from the last 3 days)  No results found for: TOTALPROTELP, ALBUMINELP, A1GS, A2GS, BETS, BETA2SER, GAMS, MSPIKE, SPEI (this displays SPEP labs)  No results found for: KPAFRELGTCHN, LAMBDASER, KAPLAMBRATIO (kappa/lambda light  chains)  No results found for: HGBA, HGBA2QUANT, HGBFQUANT, HGBSQUAN (Hemoglobinopathy evaluation)   No results found for: LDH  No results found for: IRON, TIBC, IRONPCTSAT (Iron and TIBC)  No results found for: FERRITIN  Urinalysis    Component Value Date/Time   COLORURINE YELLOW 02/22/2015 0118   APPEARANCEUR CLOUDY (A) 02/22/2015 0118   LABSPEC 1.019 02/22/2015 0118   PHURINE 7.0 02/22/2015 0118   GLUCOSEU NEGATIVE 02/22/2015 0118   HGBUR NEGATIVE 02/22/2015 0118   BILIRUBINUR NEGATIVE 02/22/2015 0118   KETONESUR NEGATIVE 02/22/2015 0118   PROTEINUR NEGATIVE 02/22/2015 0118   UROBILINOGEN 1.0 02/22/2015 0118   NITRITE NEGATIVE 02/22/2015 0118   LEUKOCYTESUR NEGATIVE 02/22/2015 0118     STUDIES:  No results found.   ELIGIBLE FOR AVAILABLE RESEARCH PROTOCOL: no   ASSESSMENT: 58 y.o. [name], [place] woman status post left breast upper outer quadrant biopsy 09/23/2018 for a clinical T1c N0, stage IA invasive ductal carcinoma, grade 3, triple positive, with an MIB-1 of 40%  (1) neoadjuvant chemotherapy consisting of carboplatin, docetaxel, and trastuzumab started 10/22/2018, to be repeated every 21 days x 6  (a) echocardiogram on 10/07/2018 shows a LVEF of 60-65%.  (b) docetaxel discontinued and gemcitabine substituted with cycle 5 because of neuropathy  (2) definitive surgery to follow  (3) adjuvant radiation  (4) antiestrogens to start at the completion of local treatment    PLAN: Jung will proceed to cycle 5 of 6 planned of chemotherapy  together with trastuzumab.  She has developed grade 1 peripheral neuropathy.  We are switching her from docetaxel to gemcitabine.  She has a good understanding of the possible toxicities side effects and complications of this agent.  She is due for echocardiogram which we will schedule between now and her next cycle  I am also scheduling her for an MRI after her next cycle of chemotherapy.  She will then see Dr. Brantley Stage for discussion of definitive surgery.  She is planning on lumpectomy and sentinel lymph node sampling  She understands she will need to continue the trastuzumab to complete a year  She knows to call for any other issues that may develop before the next visit here.  [name]. [name], MD  01/11/19 8:57 AM Medical Oncology and Hematology [facility] [address] [place], [place] [address] Tel. [phone]    Fax. [phone]   I, Wilburn Mylar, am acting as scribe for Dr. [name]. [name].  I, Lurline Del MD, have reviewed the above documentation for accuracy and completeness, and I agree with the above.

## 2019-01-08 ENCOUNTER — Other Ambulatory Visit: Payer: Self-pay

## 2019-01-08 DIAGNOSIS — C50412 Malignant neoplasm of upper-outer quadrant of left female breast: Secondary | ICD-10-CM

## 2019-01-08 DIAGNOSIS — Z170 Estrogen receptor positive status [ER+]: Secondary | ICD-10-CM

## 2019-01-11 ENCOUNTER — Other Ambulatory Visit: Payer: Self-pay

## 2019-01-11 ENCOUNTER — Inpatient Hospital Stay: Payer: 59 | Attending: Oncology

## 2019-01-11 ENCOUNTER — Inpatient Hospital Stay (HOSPITAL_BASED_OUTPATIENT_CLINIC_OR_DEPARTMENT_OTHER): Payer: 59 | Admitting: Medical

## 2019-01-11 ENCOUNTER — Encounter: Payer: Self-pay | Admitting: *Deleted

## 2019-01-11 ENCOUNTER — Inpatient Hospital Stay (HOSPITAL_BASED_OUTPATIENT_CLINIC_OR_DEPARTMENT_OTHER): Payer: 59 | Admitting: Oncology

## 2019-01-11 ENCOUNTER — Other Ambulatory Visit: Payer: Self-pay | Admitting: Medical

## 2019-01-11 ENCOUNTER — Inpatient Hospital Stay: Payer: 59

## 2019-01-11 VITALS — BP 118/69 | HR 94 | Temp 98.2°F | Resp 18 | Ht 62.0 in | Wt 207.0 lb

## 2019-01-11 DIAGNOSIS — C50412 Malignant neoplasm of upper-outer quadrant of left female breast: Secondary | ICD-10-CM

## 2019-01-11 DIAGNOSIS — Z170 Estrogen receptor positive status [ER+]: Secondary | ICD-10-CM

## 2019-01-11 DIAGNOSIS — Z95828 Presence of other vascular implants and grafts: Secondary | ICD-10-CM

## 2019-01-11 DIAGNOSIS — G620 Drug-induced polyneuropathy: Secondary | ICD-10-CM | POA: Insufficient documentation

## 2019-01-11 DIAGNOSIS — Z5189 Encounter for other specified aftercare: Secondary | ICD-10-CM | POA: Diagnosis not present

## 2019-01-11 DIAGNOSIS — T451X5A Adverse effect of antineoplastic and immunosuppressive drugs, initial encounter: Secondary | ICD-10-CM | POA: Insufficient documentation

## 2019-01-11 DIAGNOSIS — Z79899 Other long term (current) drug therapy: Secondary | ICD-10-CM | POA: Diagnosis not present

## 2019-01-11 DIAGNOSIS — L299 Pruritus, unspecified: Secondary | ICD-10-CM

## 2019-01-11 DIAGNOSIS — Z5112 Encounter for antineoplastic immunotherapy: Secondary | ICD-10-CM | POA: Insufficient documentation

## 2019-01-11 DIAGNOSIS — Z5111 Encounter for antineoplastic chemotherapy: Secondary | ICD-10-CM | POA: Insufficient documentation

## 2019-01-11 DIAGNOSIS — T8090XA Unspecified complication following infusion and therapeutic injection, initial encounter: Secondary | ICD-10-CM

## 2019-01-11 HISTORY — DX: Presence of other vascular implants and grafts: Z95.828

## 2019-01-11 LAB — CMP (CANCER CENTER ONLY)
ALT: 42 U/L (ref 0–44)
AST: 19 U/L (ref 15–41)
Albumin: 3.5 g/dL (ref 3.5–5.0)
Alkaline Phosphatase: 100 U/L (ref 38–126)
Anion gap: 9 (ref 5–15)
BUN: 11 mg/dL (ref 6–20)
CO2: 25 mmol/L (ref 22–32)
Calcium: 8.3 mg/dL — ABNORMAL LOW (ref 8.9–10.3)
Chloride: 108 mmol/L (ref 98–111)
Creatinine: 0.71 mg/dL (ref 0.44–1.00)
GFR, Est AFR Am: >60 mL/min (ref >60)
GFR, Estimated: >60 mL/min (ref >60)
Glucose, Bld: 107 mg/dL — ABNORMAL HIGH (ref 70–99)
Potassium: 3.9 mmol/L (ref 3.5–5.1)
Sodium: 142 mmol/L (ref 135–145)
Total Bilirubin: 0.3 mg/dL (ref 0.3–1.2)
Total Protein: 6.1 g/dL — ABNORMAL LOW (ref 6.5–8.1)

## 2019-01-11 LAB — CBC WITH DIFFERENTIAL (CANCER CENTER ONLY)
Abs Immature Granulocytes: 0.01 10*3/uL (ref 0.00–0.07)
Basophils Absolute: 0 10*3/uL (ref 0.0–0.1)
Basophils Relative: 1 %
Eosinophils Absolute: 0 10*3/uL (ref 0.0–0.5)
Eosinophils Relative: 0 %
HCT: 31.9 % — ABNORMAL LOW (ref 36.0–46.0)
Hemoglobin: 9.8 g/dL — ABNORMAL LOW (ref 12.0–15.0)
Immature Granulocytes: 0 %
Lymphocytes Relative: 36 %
Lymphs Abs: 1.1 10*3/uL (ref 0.7–4.0)
MCH: 28.3 pg (ref 26.0–34.0)
MCHC: 30.7 g/dL (ref 30.0–36.0)
MCV: 92.2 fL (ref 80.0–100.0)
Monocytes Absolute: 0.4 10*3/uL (ref 0.1–1.0)
Monocytes Relative: 12 %
Neutro Abs: 1.5 10*3/uL — ABNORMAL LOW (ref 1.7–7.7)
Neutrophils Relative %: 51 %
Platelet Count: 186 10*3/uL (ref 150–400)
RBC: 3.46 MIL/uL — ABNORMAL LOW (ref 3.87–5.11)
RDW: 17.7 % — ABNORMAL HIGH (ref 11.5–15.5)
WBC Count: 2.9 10*3/uL — ABNORMAL LOW (ref 4.0–10.5)
nRBC: 0 % (ref 0.0–0.2)

## 2019-01-11 MED ORDER — DIPHENHYDRAMINE HCL 25 MG PO CAPS
ORAL_CAPSULE | ORAL | Status: AC
  Filled 2019-01-11: qty 1

## 2019-01-11 MED ORDER — PALONOSETRON HCL INJECTION 0.25 MG/5ML
0.2500 mg | Freq: Once | INTRAVENOUS | Status: AC
  Administered 2019-01-11: 0.25 mg via INTRAVENOUS

## 2019-01-11 MED ORDER — HEPARIN SOD (PORK) LOCK FLUSH 100 UNIT/ML IV SOLN
500.0000 [IU] | Freq: Once | INTRAVENOUS | Status: AC | PRN
  Administered 2019-01-11: 500 [IU]
  Filled 2019-01-11: qty 5

## 2019-01-11 MED ORDER — SODIUM CHLORIDE 0.9 % IV SOLN
575.0000 mg | Freq: Once | INTRAVENOUS | Status: AC
  Administered 2019-01-11: 12:00:00 580 mg via INTRAVENOUS
  Filled 2019-01-11: qty 58

## 2019-01-11 MED ORDER — SODIUM CHLORIDE 0.9% FLUSH
10.0000 mL | INTRAVENOUS | Status: DC | PRN
  Administered 2019-01-11: 10 mL
  Filled 2019-01-11: qty 10

## 2019-01-11 MED ORDER — ACETAMINOPHEN 325 MG PO TABS
ORAL_TABLET | ORAL | Status: AC
  Filled 2019-01-11: qty 2

## 2019-01-11 MED ORDER — TRASTUZUMAB CHEMO 150 MG IV SOLR
600.0000 mg | Freq: Once | INTRAVENOUS | Status: AC
  Administered 2019-01-11: 600 mg via INTRAVENOUS
  Filled 2019-01-11: qty 28.57

## 2019-01-11 MED ORDER — SODIUM CHLORIDE 0.9% FLUSH
10.0000 mL | INTRAVENOUS | Status: DC | PRN
  Administered 2019-01-11: 12:00:00 10 mL
  Filled 2019-01-11: qty 10

## 2019-01-11 MED ORDER — PALONOSETRON HCL INJECTION 0.25 MG/5ML
INTRAVENOUS | Status: AC
  Filled 2019-01-11: qty 5

## 2019-01-11 MED ORDER — DIPHENHYDRAMINE HCL 25 MG PO CAPS
25.0000 mg | ORAL_CAPSULE | Freq: Once | ORAL | Status: AC
  Administered 2019-01-11: 25 mg via ORAL

## 2019-01-11 MED ORDER — ACETAMINOPHEN 325 MG PO TABS
650.0000 mg | ORAL_TABLET | Freq: Once | ORAL | Status: AC
  Administered 2019-01-11: 650 mg via ORAL

## 2019-01-11 MED ORDER — SODIUM CHLORIDE 0.9 % IV SOLN
Freq: Once | INTRAVENOUS | Status: AC
  Administered 2019-01-11: 09:00:00 via INTRAVENOUS
  Filled 2019-01-11: qty 250

## 2019-01-11 MED ORDER — PEGFILGRASTIM 6 MG/0.6ML ~~LOC~~ PSKT
PREFILLED_SYRINGE | SUBCUTANEOUS | Status: AC
  Filled 2019-01-11: qty 0.6

## 2019-01-11 MED ORDER — PEGFILGRASTIM 6 MG/0.6ML ~~LOC~~ PSKT
6.0000 mg | PREFILLED_SYRINGE | Freq: Once | SUBCUTANEOUS | Status: AC
  Administered 2019-01-11: 6 mg via SUBCUTANEOUS

## 2019-01-11 MED ORDER — FAMOTIDINE IN NACL 20-0.9 MG/50ML-% IV SOLN
20.0000 mg | Freq: Once | INTRAVENOUS | Status: AC
  Administered 2019-01-11: 20 mg via INTRAVENOUS

## 2019-01-11 MED ORDER — SODIUM CHLORIDE 0.9 % IV SOLN
1600.0000 mg | Freq: Once | INTRAVENOUS | Status: AC
  Administered 2019-01-11: 1600 mg via INTRAVENOUS
  Filled 2019-01-11: qty 42.08

## 2019-01-11 MED ORDER — SODIUM CHLORIDE 0.9 % IV SOLN
Freq: Once | INTRAVENOUS | Status: AC
  Administered 2019-01-11: 10:00:00 via INTRAVENOUS
  Filled 2019-01-11: qty 5

## 2019-01-11 NOTE — Progress Notes (Signed)
As patient was getting up to leave [facility], she reported that her hands were itchy and that she was having abdominal discomfort. This RN noted her palms were red, Sandi Mealy, PA notified. Sandi Mealy, PA to bedside. At that time, pt stated "my feet are getting itchy too". Per Sandi Mealy, PA patient was reaccessed and given pepcid (see eMAR). After infusion finished, pt's hands were no longer red, she reported that the itchiness was "much better" and she reported no abdominal discomfort. Pt discharged per Sandi Mealy, PA with instructions to take 25mg  benadryl tablet at home. Pt verbalizes understanding and agrees with plan of care. Pt also reports no concerns at this time

## 2019-01-11 NOTE — Patient Instructions (Signed)
Coronavirus (COVID-19) Are you at risk?  Are you at risk for the Coronavirus (COVID-19)?  To be considered HIGH RISK for Coronavirus (COVID-19), you have to meet the following criteria:  . Traveled to [place], [place], [place], [place] or [place]; or in the [place] to [place], [place], [place], or [place]; and have fever, cough, and shortness of breath within the last 2 weeks of travel OR . Been in close contact with a person diagnosed with COVID-19 within the last 2 weeks and have fever, cough, and shortness of breath . IF YOU DO NOT MEET THESE CRITERIA, YOU ARE CONSIDERED LOW RISK FOR COVID-19.  What to do if you are HIGH RISK for COVID-19?  . If you are having a medical emergency, call 911. . Seek medical care right away. Before you go to a doctor's office, urgent care or emergency department, call ahead and tell them about your recent travel, contact with someone diagnosed with COVID-19, and your symptoms. You should receive instructions from your physician's office regarding next steps of care.  . When you arrive at healthcare provider, tell the healthcare staff immediately you have returned from visiting [place], [place], [place], [place] or [place]; or traveled in the [place] to [place], [place], [place], or [place]; in the last two weeks or you have been in close contact with a person diagnosed with COVID-19 in the last 2 weeks.   . Tell the health care staff about your symptoms: fever, cough and shortness of breath. . After you have been seen by a medical provider, you will be either: o Tested for (COVID-19) and discharged home on quarantine except to seek medical care if symptoms worsen, and asked to  - Stay home and avoid contact with others until you get your results (4-5 days)  - Avoid travel on public transportation if possible (such as bus, train, or airplane) or o Sent to the Emergency Department by EMS for evaluation, COVID-19 testing, and possible  admission depending on your condition and test results.  What to do if you are LOW RISK for COVID-19?  Reduce your risk of any infection by using the same precautions used for avoiding the common cold or flu:  . Wash your hands often with soap and warm water for at least 20 seconds.  If soap and water are not readily available, use an alcohol-based hand sanitizer with at least 60% alcohol.  . If coughing or sneezing, cover your mouth and nose by coughing or sneezing into the elbow areas of your shirt or coat, into a tissue or into your sleeve (not your hands). . Avoid shaking hands with others and consider head nods or verbal greetings only. . Avoid touching your eyes, nose, or mouth with unwashed hands.  . Avoid close contact with people who are sick. . Avoid places or events with large numbers of people in one location, like concerts or sporting events. . Carefully consider travel plans you have or are making. . If you are planning any travel outside or inside the [place], visit the CDC's Travelers' Health webpage for the latest health notices. . If you have some symptoms but not all symptoms, continue to monitor at home and seek medical attention if your symptoms worsen. . If you are having a medical emergency, call 911.   ADDITIONAL HEALTHCARE OPTIONS FOR PATIENTS  [facility] / e-Visit: [email or web address]         [facility]: [phone]  Onset   Urgent Care: [facility]: [place] Discharge Instructions for Patients Receiving Chemotherapy  Today you received the following chemotherapy agents Herceptin, Gemzar and Carboplatin   To help prevent nausea and vomiting after your treatment, we encourage you to take your nausea medication as directed.    If you develop nausea and vomiting that is not controlled by your nausea medication, call  the clinic.   BELOW ARE SYMPTOMS THAT SHOULD BE REPORTED IMMEDIATELY:  *FEVER GREATER THAN 100.5 F  *CHILLS WITH OR WITHOUT FEVER  NAUSEA AND VOMITING THAT IS NOT CONTROLLED WITH YOUR NAUSEA MEDICATION  *UNUSUAL SHORTNESS OF BREATH  *UNUSUAL BRUISING OR BLEEDING  TENDERNESS IN MOUTH AND THROAT WITH OR WITHOUT PRESENCE OF ULCERS  *URINARY PROBLEMS  *BOWEL PROBLEMS  UNUSUAL RASH Items with * indicate a potential emergency and should be followed up as soon as possible.  Feel free to call the clinic should you have any questions or concerns. The clinic phone number is [phone].  Please show the [facility] at check-in to the Emergency Department and triage nurse.  Gemcitabine injection What is this medicine? GEMCITABINE (jem SYE ta been) is a chemotherapy drug. This medicine is used to treat many types of cancer like breast cancer, lung cancer, pancreatic cancer, and ovarian cancer. This medicine may be used for other purposes; ask your health care provider or pharmacist if you have questions. COMMON BRAND NAME(S): Gemzar, Infugem What should I tell my health care provider before I take this medicine? They need to know if you have any of these conditions: -blood disorders -infection -kidney disease -liver disease -lung or breathing disease, like asthma -recent or ongoing radiation therapy -an unusual or allergic reaction to gemcitabine, other chemotherapy, other medicines, foods, dyes, or preservatives -pregnant or trying to get pregnant -breast-feeding How should I use this medicine? This drug is given as an infusion into a vein. It is administered in a hospital or clinic by a specially trained health care professional. Talk to your pediatrician regarding the use of this medicine in children. Special care may be needed. Overdosage: If you think you have taken too much of this medicine contact a poison control center or emergency room at once. NOTE: This medicine  is only for you. Do not share this medicine with others. What if I miss a dose? It is important not to miss your dose. Call your doctor or health care professional if you are unable to keep an appointment. What may interact with this medicine? -medicines to increase blood counts like filgrastim, pegfilgrastim, sargramostim -some other chemotherapy drugs like cisplatin -vaccines Talk to your doctor or health care professional before taking any of these medicines: -acetaminophen -aspirin -ibuprofen -ketoprofen -naproxen This list may not describe all possible interactions. Give your health care provider a list of all the medicines, herbs, non-prescription drugs, or dietary supplements you use. Also tell them if you smoke, drink alcohol, or use illegal drugs. Some items may interact with your medicine. What should I watch for while using this medicine? Visit your doctor for checks on your progress. This drug may make you feel generally unwell. This is not uncommon, as chemotherapy can affect healthy cells as well as cancer cells. Report any side effects. Continue your course of treatment even though you feel ill unless your doctor tells you to stop. In some cases, you  may be given additional medicines to help with side effects. Follow all directions for their use. Call your doctor or health care professional for advice if you get a fever, chills or sore throat, or other symptoms of a cold or flu. Do not treat yourself. This drug decreases your body's ability to fight infections. Try to avoid being around people who are sick. This medicine may increase your risk to bruise or bleed. Call your doctor or health care professional if you notice any unusual bleeding. Be careful brushing and flossing your teeth or using a toothpick because you may get an infection or bleed more easily. If you have any dental work done, tell your dentist you are receiving this medicine. Avoid taking products that contain  aspirin, acetaminophen, ibuprofen, naproxen, or ketoprofen unless instructed by your doctor. These medicines may hide a fever. Do not become pregnant while taking this medicine or for 6 months after stopping it. Women should inform their doctor if they wish to become pregnant or think they might be pregnant. Men should not father a child while taking this medicine and for 3 months after stopping it. There is a potential for serious side effects to an unborn child. Talk to your health care professional or pharmacist for more information. Do not breast-feed an infant while taking this medicine or for at least 1 week after stopping it. Men should inform their doctors if they wish to father a child. This medicine may lower sperm counts. Talk with your doctor or health care professional if you are concerned about your fertility. What side effects may I notice from receiving this medicine? Side effects that you should report to your doctor or health care professional as soon as possible: -allergic reactions like skin rash, itching or hives, swelling of the face, lips, or tongue -breathing problems -pain, redness, or irritation at site where injected -signs and symptoms of a dangerous change in heartbeat or heart rhythm like chest pain; dizziness; fast or irregular heartbeat; palpitations; feeling faint or lightheaded, falls; breathing problems -signs of decreased platelets or bleeding - bruising, pinpoint red spots on the skin, black, tarry stools, blood in the urine -signs of decreased red blood cells - unusually weak or tired, feeling faint or lightheaded, falls -signs of infection - fever or chills, cough, sore throat, pain or difficulty passing urine -signs and symptoms of kidney injury like trouble passing urine or change in the amount of urine -signs and symptoms of liver injury like dark yellow or brown urine; general ill feeling or flu-like symptoms; light-colored stools; loss of appetite; nausea;  right upper belly pain; unusually weak or tired; yellowing of the eyes or skin -swelling of ankles, feet, hands Side effects that usually do not require medical attention (report to your doctor or health care professional if they continue or are bothersome): -constipation -diarrhea -hair loss -loss of appetite -nausea -rash -vomiting This list may not describe all possible side effects. Call your doctor for medical advice about side effects. You may report side effects to FDA at 1-800-FDA-1088. Where should I keep my medicine? This drug is given in a hospital or clinic and will not be stored at home. NOTE: This sheet is a summary. It may not cover all possible information. If you have questions about this medicine, talk to your doctor, pharmacist, or health care provider.  2019 Elsevier/Gold Standard (2017-10-22 18:06:11)

## 2019-01-12 NOTE — Progress Notes (Signed)
Ms. Salamone was seen in infusion today.  She had completed her infusion of carboplatin, gemcitabine, and trastuzumab.  She has been the accessed and was ready to leave when she reported that she was having erythema and pruritus of her palms.  She was given her port was reaccessed and she was given Pepcid 20 mg IV x1 with resolution of her symptoms.  She was then discharged home.  Sandi Mealy, MHS, PA-C Physician Assistant

## 2019-01-19 ENCOUNTER — Other Ambulatory Visit: Payer: Self-pay | Admitting: Oncology

## 2019-01-19 DIAGNOSIS — C50412 Malignant neoplasm of upper-outer quadrant of left female breast: Secondary | ICD-10-CM

## 2019-01-19 DIAGNOSIS — Z170 Estrogen receptor positive status [ER+]: Secondary | ICD-10-CM

## 2019-01-25 ENCOUNTER — Ambulatory Visit (HOSPITAL_COMMUNITY): Payer: 59 | Attending: Cardiovascular Disease

## 2019-01-25 ENCOUNTER — Other Ambulatory Visit: Payer: Self-pay

## 2019-01-25 DIAGNOSIS — Z170 Estrogen receptor positive status [ER+]: Secondary | ICD-10-CM | POA: Diagnosis not present

## 2019-01-25 DIAGNOSIS — C50412 Malignant neoplasm of upper-outer quadrant of left female breast: Secondary | ICD-10-CM | POA: Diagnosis not present

## 2019-01-28 ENCOUNTER — Other Ambulatory Visit: Payer: 59

## 2019-02-02 ENCOUNTER — Inpatient Hospital Stay: Payer: 59

## 2019-02-02 ENCOUNTER — Other Ambulatory Visit: Payer: Self-pay | Admitting: Medical

## 2019-02-02 ENCOUNTER — Inpatient Hospital Stay (HOSPITAL_BASED_OUTPATIENT_CLINIC_OR_DEPARTMENT_OTHER): Payer: 59 | Admitting: Adult Health

## 2019-02-02 ENCOUNTER — Other Ambulatory Visit: Payer: Self-pay

## 2019-02-02 ENCOUNTER — Ambulatory Visit (HOSPITAL_BASED_OUTPATIENT_CLINIC_OR_DEPARTMENT_OTHER): Payer: 59 | Admitting: Medical

## 2019-02-02 ENCOUNTER — Encounter: Payer: Self-pay | Admitting: *Deleted

## 2019-02-02 ENCOUNTER — Encounter: Payer: Self-pay | Admitting: Oncology

## 2019-02-02 ENCOUNTER — Encounter: Payer: Self-pay | Admitting: Adult Health

## 2019-02-02 VITALS — BP 131/76 | HR 93 | Resp 19

## 2019-02-02 VITALS — BP 119/66 | HR 87 | Temp 98.2°F | Resp 20 | Ht 62.0 in | Wt 205.1 lb

## 2019-02-02 DIAGNOSIS — T451X5A Adverse effect of antineoplastic and immunosuppressive drugs, initial encounter: Secondary | ICD-10-CM | POA: Diagnosis not present

## 2019-02-02 DIAGNOSIS — Z79899 Other long term (current) drug therapy: Secondary | ICD-10-CM | POA: Diagnosis not present

## 2019-02-02 DIAGNOSIS — Z170 Estrogen receptor positive status [ER+]: Secondary | ICD-10-CM

## 2019-02-02 DIAGNOSIS — L298 Other pruritus: Secondary | ICD-10-CM

## 2019-02-02 DIAGNOSIS — C50412 Malignant neoplasm of upper-outer quadrant of left female breast: Secondary | ICD-10-CM

## 2019-02-02 DIAGNOSIS — L299 Pruritus, unspecified: Secondary | ICD-10-CM

## 2019-02-02 DIAGNOSIS — Z5189 Encounter for other specified aftercare: Secondary | ICD-10-CM | POA: Diagnosis not present

## 2019-02-02 DIAGNOSIS — Z5111 Encounter for antineoplastic chemotherapy: Secondary | ICD-10-CM | POA: Diagnosis not present

## 2019-02-02 DIAGNOSIS — G620 Drug-induced polyneuropathy: Secondary | ICD-10-CM | POA: Diagnosis not present

## 2019-02-02 DIAGNOSIS — Z95828 Presence of other vascular implants and grafts: Secondary | ICD-10-CM

## 2019-02-02 DIAGNOSIS — Z5112 Encounter for antineoplastic immunotherapy: Secondary | ICD-10-CM | POA: Diagnosis not present

## 2019-02-02 LAB — COMPREHENSIVE METABOLIC PANEL
ALT: 25 U/L (ref 0–44)
AST: 17 U/L (ref 15–41)
Albumin: 3.9 g/dL (ref 3.5–5.0)
Alkaline Phosphatase: 111 U/L (ref 38–126)
Anion gap: 8 (ref 5–15)
BUN: 12 mg/dL (ref 6–20)
CO2: 25 mmol/L (ref 22–32)
Calcium: 8.7 mg/dL — ABNORMAL LOW (ref 8.9–10.3)
Chloride: 109 mmol/L (ref 98–111)
Creatinine, Ser: 0.75 mg/dL (ref 0.44–1.00)
GFR calc Af Amer: >60 mL/min (ref >60)
GFR calc non Af Amer: >60 mL/min (ref >60)
Glucose, Bld: 94 mg/dL (ref 70–99)
Potassium: 3.9 mmol/L (ref 3.5–5.1)
Sodium: 142 mmol/L (ref 135–145)
Total Bilirubin: 0.3 mg/dL (ref 0.3–1.2)
Total Protein: 6.6 g/dL (ref 6.5–8.1)

## 2019-02-02 LAB — CBC WITH DIFFERENTIAL/PLATELET
Abs Immature Granulocytes: 0.02 10*3/uL (ref 0.00–0.07)
Basophils Absolute: 0 10*3/uL (ref 0.0–0.1)
Basophils Relative: 1 %
Eosinophils Absolute: 0 10*3/uL (ref 0.0–0.5)
Eosinophils Relative: 1 %
HCT: 30.6 % — ABNORMAL LOW (ref 36.0–46.0)
Hemoglobin: 9.4 g/dL — ABNORMAL LOW (ref 12.0–15.0)
Immature Granulocytes: 1 %
Lymphocytes Relative: 34 %
Lymphs Abs: 1.3 10*3/uL (ref 0.7–4.0)
MCH: 28.8 pg (ref 26.0–34.0)
MCHC: 30.7 g/dL (ref 30.0–36.0)
MCV: 93.9 fL (ref 80.0–100.0)
Monocytes Absolute: 0.5 10*3/uL (ref 0.1–1.0)
Monocytes Relative: 13 %
Neutro Abs: 2 10*3/uL (ref 1.7–7.7)
Neutrophils Relative %: 50 %
Platelets: 298 10*3/uL (ref 150–400)
RBC: 3.26 MIL/uL — ABNORMAL LOW (ref 3.87–5.11)
RDW: 17.5 % — ABNORMAL HIGH (ref 11.5–15.5)
WBC: 3.8 10*3/uL — ABNORMAL LOW (ref 4.0–10.5)
nRBC: 0 % (ref 0.0–0.2)

## 2019-02-02 MED ORDER — SODIUM CHLORIDE 0.9 % IV SOLN
Freq: Once | INTRAVENOUS | Status: AC
  Administered 2019-02-02: 13:00:00 via INTRAVENOUS
  Filled 2019-02-02: qty 250

## 2019-02-02 MED ORDER — FAMOTIDINE IN NACL 20-0.9 MG/50ML-% IV SOLN
INTRAVENOUS | Status: AC
  Filled 2019-02-02: qty 50

## 2019-02-02 MED ORDER — ACETAMINOPHEN 325 MG PO TABS
ORAL_TABLET | ORAL | Status: AC
  Filled 2019-02-02: qty 2

## 2019-02-02 MED ORDER — SODIUM CHLORIDE 0.9% FLUSH
10.0000 mL | INTRAVENOUS | Status: DC | PRN
  Administered 2019-02-02: 10 mL
  Filled 2019-02-02: qty 10

## 2019-02-02 MED ORDER — DEXAMETHASONE 4 MG PO TABS
4.0000 mg | ORAL_TABLET | Freq: Once | ORAL | Status: AC
  Administered 2019-02-02: 4 mg via ORAL

## 2019-02-02 MED ORDER — SODIUM CHLORIDE 0.9 % IV SOLN
Freq: Once | INTRAVENOUS | Status: AC
  Administered 2019-02-02: 13:00:00 via INTRAVENOUS
  Filled 2019-02-02: qty 5

## 2019-02-02 MED ORDER — DEXAMETHASONE 4 MG PO TABS
ORAL_TABLET | ORAL | Status: AC
  Filled 2019-02-02: qty 1

## 2019-02-02 MED ORDER — HEPARIN SOD (PORK) LOCK FLUSH 100 UNIT/ML IV SOLN
500.0000 [IU] | Freq: Once | INTRAVENOUS | Status: AC | PRN
  Administered 2019-02-02: 500 [IU]
  Filled 2019-02-02: qty 5

## 2019-02-02 MED ORDER — SODIUM CHLORIDE 0.9 % IV SOLN
575.0000 mg | Freq: Once | INTRAVENOUS | Status: AC
  Administered 2019-02-02: 580 mg via INTRAVENOUS
  Filled 2019-02-02: qty 58

## 2019-02-02 MED ORDER — FAMOTIDINE IN NACL 20-0.9 MG/50ML-% IV SOLN
20.0000 mg | Freq: Once | INTRAVENOUS | Status: AC
  Administered 2019-02-02: 20 mg via INTRAVENOUS

## 2019-02-02 MED ORDER — ACETAMINOPHEN 325 MG PO TABS
650.0000 mg | ORAL_TABLET | Freq: Once | ORAL | Status: AC
  Administered 2019-02-02: 650 mg via ORAL

## 2019-02-02 MED ORDER — SODIUM CHLORIDE 0.9 % IV SOLN
1600.0000 mg | Freq: Once | INTRAVENOUS | Status: AC
  Administered 2019-02-02: 1600 mg via INTRAVENOUS
  Filled 2019-02-02: qty 42.08

## 2019-02-02 MED ORDER — DIPHENHYDRAMINE HCL 25 MG PO CAPS
25.0000 mg | ORAL_CAPSULE | Freq: Once | ORAL | Status: AC
  Administered 2019-02-02: 25 mg via ORAL

## 2019-02-02 MED ORDER — DIPHENHYDRAMINE HCL 25 MG PO CAPS
ORAL_CAPSULE | ORAL | Status: AC
  Filled 2019-02-02: qty 1

## 2019-02-02 MED ORDER — PALONOSETRON HCL INJECTION 0.25 MG/5ML
0.2500 mg | Freq: Once | INTRAVENOUS | Status: AC
  Administered 2019-02-02: 13:00:00 0.25 mg via INTRAVENOUS

## 2019-02-02 MED ORDER — PEGFILGRASTIM 6 MG/0.6ML ~~LOC~~ PSKT
6.0000 mg | PREFILLED_SYRINGE | Freq: Once | SUBCUTANEOUS | Status: AC
  Administered 2019-02-02: 6 mg via SUBCUTANEOUS

## 2019-02-02 MED ORDER — PEGFILGRASTIM 6 MG/0.6ML ~~LOC~~ PSKT
PREFILLED_SYRINGE | SUBCUTANEOUS | Status: AC
  Filled 2019-02-02: qty 0.6

## 2019-02-02 MED ORDER — PALONOSETRON HCL INJECTION 0.25 MG/5ML
INTRAVENOUS | Status: AC
  Filled 2019-02-02: qty 5

## 2019-02-02 MED ORDER — TRASTUZUMAB CHEMO 150 MG IV SOLR
600.0000 mg | Freq: Once | INTRAVENOUS | Status: AC
  Administered 2019-02-02: 600 mg via INTRAVENOUS
  Filled 2019-02-02: qty 28.57

## 2019-02-02 MED ORDER — PREDNISONE 50 MG PO TABS
ORAL_TABLET | ORAL | Status: AC
  Filled 2019-02-02: qty 1

## 2019-02-02 NOTE — Addendum Note (Signed)
Addended by: Smiley Houseman F on: 02/02/2019 05:20 PM   Modules accepted: Orders

## 2019-02-02 NOTE — Patient Instructions (Signed)
[facility] Discharge Instructions for Patients Receiving Chemotherapy  Today you received the following chemotherapy agents Carboplatin, Gemzar and Herceptin. To help prevent nausea and vomiting after your treatment, we encourage you to take your nausea medication as directed BUT NO ZOFRAN FOR 3 DAYS AFTER CHEMO.   If you develop nausea and vomiting that is not controlled by your nausea medication, call the clinic.   BELOW ARE SYMPTOMS THAT SHOULD BE REPORTED IMMEDIATELY:  *FEVER GREATER THAN 100.5 F  *CHILLS WITH OR WITHOUT FEVER  NAUSEA AND VOMITING THAT IS NOT CONTROLLED WITH YOUR NAUSEA MEDICATION  *UNUSUAL SHORTNESS OF BREATH  *UNUSUAL BRUISING OR BLEEDING  TENDERNESS IN MOUTH AND THROAT WITH OR WITHOUT PRESENCE OF ULCERS  *URINARY PROBLEMS  *BOWEL PROBLEMS  UNUSUAL RASH Items with * indicate a potential emergency and should be followed up as soon as possible.  Feel free to call the clinic you have any questions or concerns. The clinic phone number is [phone].  Please show the [facility] at check-in to the Emergency Department and triage nurse.

## 2019-02-02 NOTE — Progress Notes (Signed)
[facility]  Telephone:([phone]) [phone] [phone]) [phone]    ID: Natasha Huffman DOB: [birth year]  MR#: [id number]  WIO#:[id number]  Patient Care Team: [name], MD as PCP - General (Family Medicine) Rockwell Germany, RN as Oncology Nurse Navigator Mauro Kaufmann, RN as Oncology Nurse Navigator [name], MD as Consulting Physician (General Surgery) [name], [name], MD as Consulting Physician (Oncology) Kyung Rudd, MD as Consulting Physician (Radiation Oncology) Larey Dresser, MD as Consulting Physician (Cardiology) OTHER MD:    CHIEF COMPLAINT: Triple positive breast cancer  CURRENT TREATMENT: Neoadjuvant chemotherapy   INTERVAL HISTORY: Natasha Huffman returns today for follow-up and treatment of her HER-2 positive breast cancer.   She is receiving neoadjuvant chemotherapy with Docetaxel, Carboplatin, Trastuzumab given on day 1 of a 21 day cycle with growth factor support with Onpro. Today is day 1 cycle 6. 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     REVIEW OF SYSTEMS: Tacarra had a rash and itching on her hands and feet at chemo completion.  It improved with infusion of famotidine.  She had no skin issues after that.  She took 2 benadryl that night and had no further issues.  Her MRI is scheduled on 02/08/2019.  She has no follow up with Dr. Brantley Stage scheduled as of yet.  She underwent echo on 01/25/2019 that showed an EF of greater than 65%.    Natasha Huffman is doing well today otherwise.  She denies any fever or chills.  She is without any nausea or vomiting.  She hasn't noted any headaches, vision changes, chest pain, palpitations, cough, shortness of breath, or any other concerns.  A detailed ROS was otherwise non contributory.    HISTORY OF CURRENT ILLNESS: From the original intake note:  Natasha Huffman had routine screening mammography on 09/23/2018 showing a possible abnormality in the left breast. She underwent  unilateral left diagnostic mammography with tomography and left breast ultrasonography at [facility] on 09/23/2018 showing: Breast Density Category B. There is a mass with irregular margins in the upper-outer quadrant of the left breast. On physical exam, there is palpable focal thickening in the 2 o'clock location of the left breast. Sonographically, an irregular hypoechoic mass with internal vascularity in the 2 o'clock location of the left breast 10 cm from the nipple is seen. The mass measures 1.8 x 1.1 x 1.0 cm. There is associated posterior acoustic enhancement. Evaluation of the left axilla is negative for adenopathy.  Accordingly on 09/23/2018 she proceeded to biopsy of the left breast area in question. The pathology from this procedure showed ([id number]): invasive ductal carcinoma, grade III. Prognostic indicators significant for: estrogen receptor, 100% positive and progesterone receptor, 2% positive, both with strong staining intensity. Proliferation marker Ki67 at 40%. HER2 positive (3+) by immunohistochemistry.   The patient's subsequent history is as detailed above.   PAST MEDICAL HISTORY: Past Medical History:  Diagnosis Date  . Cancer ([place] [facility]) 09/23/2018   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 ,   . Gallstones   . GERD (gastroesophageal reflux disease)    resolved      PAST SURGICAL HISTORY: Past Surgical History:  Procedure Laterality Date  . [place]; 1995  . CHOLECYSTECTOMY N/A 02/22/2015   Procedure: LAPAROSCOPIC CHOLECYSTECTOMY WITH INTRAOPERATIVE CHOLANGIOGRAM;  Surgeon: [name], MD;  Location: [facility];  Service: General;  Laterality: N/A;  . LAPAROSCOPIC CHOLECYSTECTOMY  02/22/2015  . PORTACATH PLACEMENT Right 10/08/2018   Procedure:  INSERTION PORT-A-CATH WITH ULTRASOUND;  Surgeon: [name], MD;  Location: [facility];  Service: General;  Laterality: Right;  . TUBAL LIGATION  1995     FAMILY HISTORY: Family History  Problem Relation Age of  Onset  . Hypertension Sister   . Colon cancer Maternal Aunt   . Breast cancer Neg Hx    Natasha Huffman's father died from sepsis at age 67. Patients' mother is 39 years old as of 09/2018. The patient has 3 brothers and 5 sisters. Patient denies anyone in her family having breast, ovarian, prostate, or pancreatic cancer. Natasha Huffman's maternal aunt was diagnosed with colon cancer at age 45.    GYNECOLOGIC HISTORY:  No LMP recorded. Patient is postmenopausal. 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 Her husband, [name], works at a [employer] in [place]. 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 Natasha Huffman has two children, [name] and [name]. [name] is 61, lives in [place], [place], and is a Animator. Natasha Huffman is 44, lives in [place], and works for the [employer]. Natasha Huffman has one grandchild. She attends the [employer] in [employer]: Natasha Huffman's husband, [name], is automatically her healthcare power of attorney.     HEALTH MAINTENANCE: Social History   Tobacco Use  . Smoking status: Never Smoker  . Smokeless tobacco: Never Used  Substance Use Topics  . Alcohol use: No  . Drug use: No    Colonoscopy: yes, 2015, [facility]  PAP: 09/2017  Bone density: no   No Known Allergies  Current Outpatient Medications  Medication Sig Dispense Refill  . ciprofloxacin (CIPRO) 500 MG tablet Take 1 tablet (500 mg total) by mouth 2 (two) times daily. 14 tablet 4  . fluconazole (DIFLUCAN) 100 MG tablet Take 1 tablet (100 mg total) by mouth daily. 21 tablet 1  . lidocaine-prilocaine (EMLA) cream Apply to affected area once 30 g 3  . LORazepam (ATIVAN) 0.5 MG tablet TAKE 1 TABLET(0.5 MG) BY MOUTH AT BEDTIME AS NEEDED FOR NAUSEA OR  VOMITING 20 tablet 0  . prochlorperazine (COMPAZINE) 10 MG tablet Take 1 tablet (10 mg total) by mouth every 6 (six) hours as needed (Nausea or vomiting). 30 tablet 1  . dexamethasone (DECADRON) 4 MG tablet Take 2 tablets (8 mg total) by mouth 2 (two) times daily. Start the day before Taxotere. Take once the day after, then 2 times a day x 2d. (Patient not taking: Reported on 02/02/2019) 30 tablet 1   No current facility-administered medications for this visit.       OBJECTIVE:     Vitals:   02/02/19 1134  BP: 119/66  Pulse: 87  Resp: 20  Temp: 98.2 F (36.8 C)  SpO2: 100%   Body mass index is 37.51 kg/m. Wt Readings from Last 3 Encounters:  02/02/19 205 lb 1.6 oz (93 kg)  01/11/19 207 lb (93.9 kg)  12/31/18 199 lb 4.8 oz (90.4 kg)  ECOG FS: 1 GENERAL: Patient is a well appearing female in no acute distress HEENT:  Sclerae anicteric.  Oropharynx clear and moist. No ulcerations or evidence of oropharyngeal candidiasis. Neck is supple.  NODES:  No cervical, supraclavicular, or axillary lymphadenopathy palpated.  BREAST EXAM:  Deferred. LUNGS:  Clear to auscultation bilaterally.  No wheezes or rhonchi. HEART:  Regular rate and rhythm. No murmur appreciated. ABDOMEN:  Soft, nontender.  Positive, normoactive bowel sounds. No organomegaly palpated. MSK:  No focal spinal tenderness to palpation. Full range of motion bilaterally in the upper extremities. EXTREMITIES:  No peripheral edema.   SKIN:  Clear with no obvious rashes or skin changes. No nail dyscrasia. NEURO:  Nonfocal. Well oriented.  Appropriate affect.     LAB RESULTS:  CMP     Component Value Date/Time   NA 142 02/02/2019 1123   K 3.9 02/02/2019 1123   CL 109 02/02/2019 1123   CO2 25 02/02/2019 1123   GLUCOSE 94 02/02/2019 1123   BUN 12 02/02/2019 1123   CREATININE 0.75 02/02/2019 1123   CREATININE 0.71 01/11/2019 0815   CALCIUM 8.7 (L) 02/02/2019 1123   PROT 6.6 02/02/2019 1123   ALBUMIN 3.9 02/02/2019 1123    AST 17 02/02/2019 1123   AST 19 01/11/2019 0815   ALT 25 02/02/2019 1123   ALT 42 01/11/2019 0815   ALKPHOS 111 02/02/2019 1123   BILITOT 0.3 02/02/2019 1123   BILITOT 0.3 01/11/2019 0815   GFRNONAA >60 02/02/2019 1123   GFRNONAA >60 01/11/2019 0815   GFRAA >60 02/02/2019 1123   GFRAA >60 01/11/2019 0815    No results found for: TOTALPROTELP, ALBUMINELP, A1GS, A2GS, BETS, BETA2SER, GAMS, MSPIKE, SPEI  No results found for: KPAFRELGTCHN, LAMBDASER, KAPLAMBRATIO  Lab Results  Component Value Date   WBC 3.8 (L) 02/02/2019   NEUTROABS 2.0 02/02/2019   HGB 9.4 (L) 02/02/2019   HCT 30.6 (L) 02/02/2019   MCV 93.9 02/02/2019   PLT 298 02/02/2019    _0 @  No results found for: LABCA2  No components found for: WUJWJX914  No results for input(s): INR in the last 168 hours.  No results found for: LABCA2  No results found for: NWG956  No results found for: OZH086  No results found for: VHQ469  No results found for: CA2729  No components found for: HGQUANT  No results found for: CEA1 / No results found for: CEA1   No results found for: AFPTUMOR  No results found for: CHROMOGRNA  No results found for: PSA1  Appointment on 02/02/2019  Component Date Value Ref Range Status  . Sodium 02/02/2019 142  135 - 145 mmol/L Final  . Potassium 02/02/2019 3.9  3.5 - 5.1 mmol/L Final  . Chloride 02/02/2019 109  98 - 111 mmol/L Final  . CO2 02/02/2019 25  22 - 32 mmol/L Final  . Glucose, Bld 02/02/2019 94  70 - 99 mg/dL Final  . BUN 02/02/2019 12  6 - 20 mg/dL Final  . Creatinine, Ser 02/02/2019 0.75  0.44 - 1.00 mg/dL Final  . Calcium 02/02/2019 8.7* 8.9 - 10.3 mg/dL Final  . Total Protein 02/02/2019 6.6  6.5 - 8.1 g/dL Final  . Albumin 02/02/2019 3.9  3.5 - 5.0 g/dL Final  . AST 02/02/2019 17  15 - 41 U/L Final  . ALT 02/02/2019 25  0 - 44 U/L Final  . Alkaline Phosphatase 02/02/2019 111  38 - 126 U/L Final  . Total Bilirubin 02/02/2019 0.3  0.3 - 1.2 mg/dL  Final  . GFR calc non Af Amer 02/02/2019 >60  >60 mL/min Final  . GFR calc Af Amer 02/02/2019 >60  >60 mL/min Final  . Anion gap 02/02/2019 8  5 - 15 Final   Performed at [facility], [place] [address], [place], [place] [address]  .  WBC 02/02/2019 3.8* 4.0 - 10.5 K/uL Final  . RBC 02/02/2019 3.26* 3.87 - 5.11 MIL/uL Final  . Hemoglobin 02/02/2019 9.4* 12.0 - 15.0 g/dL Final  . HCT 02/02/2019 30.6* 36.0 - 46.0 % Final  . MCV 02/02/2019 93.9  80.0 - 100.0 fL Final  . MCH 02/02/2019 28.8  26.0 - 34.0 pg Final  . MCHC 02/02/2019 30.7  30.0 - 36.0 g/dL Final  . RDW 02/02/2019 17.5* 11.5 - 15.5 % Final  . Platelets 02/02/2019 298  150 - 400 K/uL Final  . nRBC 02/02/2019 0.0  0.0 - 0.2 % Final  . Neutrophils Relative % 02/02/2019 50  % Final  . Neutro Abs 02/02/2019 2.0  1.7 - 7.7 K/uL Final  . Lymphocytes Relative 02/02/2019 34  % Final  . Lymphs Abs 02/02/2019 1.3  0.7 - 4.0 K/uL Final  . Monocytes Relative 02/02/2019 13  % Final  . Monocytes Absolute 02/02/2019 0.5  0.1 - 1.0 K/uL Final  . Eosinophils Relative 02/02/2019 1  % Final  . Eosinophils Absolute 02/02/2019 0.0  0.0 - 0.5 K/uL Final  . Basophils Relative 02/02/2019 1  % Final  . Basophils Absolute 02/02/2019 0.0  0.0 - 0.1 K/uL Final  . Immature Granulocytes 02/02/2019 1  % Final  . Abs Immature Granulocytes 02/02/2019 0.02  0.00 - 0.07 K/uL Final   Performed at [facility], [address] [place], [place] [address]    (this displays the last labs from the last 3 days)  No results found for: TOTALPROTELP, ALBUMINELP, A1GS, A2GS, BETS, BETA2SER, GAMS, MSPIKE, SPEI (this displays SPEP labs)  No results found for: KPAFRELGTCHN, LAMBDASER, KAPLAMBRATIO (kappa/lambda light chains)  No results found for: HGBA, HGBA2QUANT, HGBFQUANT, HGBSQUAN (Hemoglobinopathy evaluation)   No results found for: LDH  No results found for: IRON, TIBC, IRONPCTSAT (Iron and TIBC)  No results  found for: FERRITIN  Urinalysis    Component Value Date/Time   COLORURINE YELLOW 02/22/2015 0118   APPEARANCEUR CLOUDY (A) 02/22/2015 0118   LABSPEC 1.019 02/22/2015 0118   PHURINE 7.0 02/22/2015 0118   GLUCOSEU NEGATIVE 02/22/2015 0118   HGBUR NEGATIVE 02/22/2015 0118   BILIRUBINUR NEGATIVE 02/22/2015 0118   KETONESUR NEGATIVE 02/22/2015 0118   PROTEINUR NEGATIVE 02/22/2015 0118   UROBILINOGEN 1.0 02/22/2015 0118   NITRITE NEGATIVE 02/22/2015 0118   LEUKOCYTESUR NEGATIVE 02/22/2015 0118     STUDIES:  No results found.   ELIGIBLE FOR AVAILABLE RESEARCH PROTOCOL: no   ASSESSMENT: 58 y.o. [name], [place] woman status post left breast upper outer quadrant biopsy 09/23/2018 for a clinical T1c N0, stage IA invasive ductal carcinoma, grade 3, triple positive, with an MIB-1 of 40%  (1) neoadjuvant chemotherapy consisting of carboplatin, docetaxel, and trastuzumab started 10/22/2018, to be repeated every 21 days x 6  (a) echocardiogram on 10/07/2018 shows a LVEF of 60-65%.  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  She is tolerating this well and her labs are stable.  I reviewed the issue of the rash/itching on her hands.  She will receive Famotidine as premed and she will be monitored for 15-30 minutes after chemo completion today.    Natasha Huffman will undergo breast MRI on 6/29, and will see Dr. Brantley Stage on 7/6.  She  will continue to undergo labs and trastuzumab every 3 weeks.  We will see her back after surgery to review her results.  She knows to call for any other issues that may develop before the next visit here.  A total of (20) minutes of face-to-face time was spent with this patient with  greater than 50% of that time in counseling and care-coordination.   Wilber Bihari, NP  02/02/19 12:01 PM Medical Oncology and Hematology [facility] [address] [place], [place] [address] Tel. [phone]    Fax. [phone]

## 2019-02-02 NOTE — Progress Notes (Signed)
Patient was observed for 15 minutes post treatment today as Natasha Ryder NP had asked I do. She reported NO itching to hands or feet and left the building. Came back now with mild itching to hands and some to feet.

## 2019-02-02 NOTE — Progress Notes (Signed)
Patient was evaluated by Lucianne Lei T PA at 420 pm today. We reaccessed her port a cath and were not able to get blood return from it. There was blood return when it was deaccessed 30 minutes prior but it was hard at times to get blood from it all day. Patient said it was  Uncomfortable and the itching was not any worse so could she just "take a pill or something". Lucianne Lei changed the order from IV pepcid to po Decadron. Patient is alert and oriented and on her phone here at the clinic.  Patient now at 1719 reports feeling better and no other concerns .

## 2019-02-03 ENCOUNTER — Telehealth: Payer: Self-pay | Admitting: Oncology

## 2019-02-03 NOTE — Telephone Encounter (Signed)
I talk with patient regarding schedule  

## 2019-02-03 NOTE — Progress Notes (Signed)
    DATE:  02/02/2019                                      X  CHEMO/IMMUNOTHERAPY REACTION             MD:  Dr. Gunnar Bulla [name]    AGENT/BLOOD PRODUCT RECEIVING TODAY:               Carboplatin, gemcitabine, and Herceptin   AGENT/BLOOD PRODUCT RECEIVING IMMEDIATELY PRIOR TO REACTION:           Carboplatin, gemcitabine, and Herceptin   REACTION(S):           Itching of the hands and feet    PREMEDS:      Aloxi, Emend, dexamethasone 12 mg, and Pepcid 20 mg IV   INTERVENTION: Nursing attempted to reaccessed the patient's Port-A-Cath and give her Pepcid 20 mg IV.  They were unable to get any blood return.  The patient expressed having some discomfort at that site.  This was discontinued and the patient was given dexamethasone 5 mg p.o. x1.   Review of Systems  Review of Systems  Constitutional: Negative for chills, diaphoresis and fever.  HENT: Negative for trouble swallowing and voice change.   Respiratory: Negative for cough, chest tightness, shortness of breath and wheezing.   Cardiovascular: Negative for chest pain and palpitations.  Gastrointestinal: Negative for abdominal pain, constipation, diarrhea, nausea and vomiting.  Musculoskeletal: Negative for back pain and myalgias.  Skin:       Itching of the hands and feet  Neurological: Negative for dizziness, light-headedness and headaches.     Physical Exam  Physical Exam Constitutional:      General: She is not in acute distress.    Appearance: She is not diaphoretic.  HENT:     Head: Normocephalic and atraumatic.  Cardiovascular:     Rate and Rhythm: Normal rate and regular rhythm.     Heart sounds: Normal heart sounds. No murmur. No friction rub. No gallop.   Pulmonary:     Effort: Pulmonary effort is normal. No respiratory distress.     Breath sounds: Normal breath sounds. No wheezing or rales.  Skin:    General: Skin is warm and dry.     Findings: No erythema or rash.  Neurological:     Mental Status: She is alert.      OUTCOME:                Ms, Ure was given dexamethasone 5 mg p.o. x1 and was observed for 30 minutes.  Her symptoms resolved and she was discharged home.   Sandi Mealy, MHS, PA-C

## 2019-02-04 ENCOUNTER — Other Ambulatory Visit: Payer: Self-pay | Admitting: Oncology

## 2019-02-04 MED FILL — FLUCONAZOLE 100 MG TAB: 100 | 21 days supply | Qty: 21 | Fill #0

## 2019-02-05 ENCOUNTER — Other Ambulatory Visit: Payer: Self-pay | Admitting: Oncology

## 2019-02-05 DIAGNOSIS — C50412 Malignant neoplasm of upper-outer quadrant of left female breast: Secondary | ICD-10-CM

## 2019-02-08 ENCOUNTER — Ambulatory Visit
Admission: RE | Admit: 2019-02-08 | Discharge: 2019-02-08 | Disposition: A | Discharge status: Home / Self Care | Payer: 59 | Source: Ambulatory Visit | Attending: Oncology | Admitting: Oncology

## 2019-02-08 ENCOUNTER — Other Ambulatory Visit: Payer: Self-pay

## 2019-02-08 DIAGNOSIS — Z853 Personal history of malignant neoplasm of breast: Secondary | ICD-10-CM | POA: Diagnosis not present

## 2019-02-08 DIAGNOSIS — Z170 Estrogen receptor positive status [ER+]: Secondary | ICD-10-CM

## 2019-02-08 DIAGNOSIS — C50412 Malignant neoplasm of upper-outer quadrant of left female breast: Secondary | ICD-10-CM

## 2019-02-08 MED ORDER — GADOBUTROL 1 MMOL/ML IV SOLN
9.0000 mL | Freq: Once | INTRAVENOUS | Status: AC | PRN
  Administered 2019-02-08: 9 mL via INTRAVENOUS

## 2019-02-23 ENCOUNTER — Other Ambulatory Visit: Payer: Self-pay | Admitting: Oncology

## 2019-02-23 ENCOUNTER — Inpatient Hospital Stay: Payer: 59

## 2019-02-23 ENCOUNTER — Other Ambulatory Visit: Payer: Self-pay

## 2019-02-23 ENCOUNTER — Inpatient Hospital Stay: Payer: 59 | Attending: Oncology

## 2019-02-23 VITALS — BP 131/78 | HR 77 | Temp 98.5°F | Resp 18

## 2019-02-23 DIAGNOSIS — C50412 Malignant neoplasm of upper-outer quadrant of left female breast: Secondary | ICD-10-CM

## 2019-02-23 DIAGNOSIS — G620 Drug-induced polyneuropathy: Secondary | ICD-10-CM | POA: Diagnosis not present

## 2019-02-23 DIAGNOSIS — Z5112 Encounter for antineoplastic immunotherapy: Secondary | ICD-10-CM | POA: Diagnosis not present

## 2019-02-23 DIAGNOSIS — Z170 Estrogen receptor positive status [ER+]: Secondary | ICD-10-CM | POA: Diagnosis not present

## 2019-02-23 DIAGNOSIS — Z95828 Presence of other vascular implants and grafts: Secondary | ICD-10-CM

## 2019-02-23 DIAGNOSIS — Z79899 Other long term (current) drug therapy: Secondary | ICD-10-CM | POA: Diagnosis not present

## 2019-02-23 LAB — COMPREHENSIVE METABOLIC PANEL
ALT: 46 U/L — ABNORMAL HIGH (ref 0–44)
AST: 29 U/L (ref 15–41)
Albumin: 3.8 g/dL (ref 3.5–5.0)
Alkaline Phosphatase: 96 U/L (ref 38–126)
Anion gap: 9 (ref 5–15)
BUN: 11 mg/dL (ref 6–20)
CO2: 25 mmol/L (ref 22–32)
Calcium: 8.6 mg/dL — ABNORMAL LOW (ref 8.9–10.3)
Chloride: 110 mmol/L (ref 98–111)
Creatinine, Ser: 0.71 mg/dL (ref 0.44–1.00)
GFR calc Af Amer: >60 mL/min (ref >60)
GFR calc non Af Amer: >60 mL/min (ref >60)
Glucose, Bld: 105 mg/dL — ABNORMAL HIGH (ref 70–99)
Potassium: 3.9 mmol/L (ref 3.5–5.1)
Sodium: 144 mmol/L (ref 135–145)
Total Bilirubin: 0.3 mg/dL (ref 0.3–1.2)
Total Protein: 6.3 g/dL — ABNORMAL LOW (ref 6.5–8.1)

## 2019-02-23 LAB — CBC WITH DIFFERENTIAL/PLATELET
Abs Immature Granulocytes: 0.01 10*3/uL (ref 0.00–0.07)
Basophils Absolute: 0 10*3/uL (ref 0.0–0.1)
Basophils Relative: 0 %
Eosinophils Absolute: 0.1 10*3/uL (ref 0.0–0.5)
Eosinophils Relative: 2 %
HCT: 29.2 % — ABNORMAL LOW (ref 36.0–46.0)
Hemoglobin: 9.2 g/dL — ABNORMAL LOW (ref 12.0–15.0)
Immature Granulocytes: 0 %
Lymphocytes Relative: 35 %
Lymphs Abs: 1 10*3/uL (ref 0.7–4.0)
MCH: 30 pg (ref 26.0–34.0)
MCHC: 31.5 g/dL (ref 30.0–36.0)
MCV: 95.1 fL (ref 80.0–100.0)
Monocytes Absolute: 0.4 10*3/uL (ref 0.1–1.0)
Monocytes Relative: 14 %
Neutro Abs: 1.4 10*3/uL — ABNORMAL LOW (ref 1.7–7.7)
Neutrophils Relative %: 49 %
Platelets: 266 10*3/uL (ref 150–400)
RBC: 3.07 MIL/uL — ABNORMAL LOW (ref 3.87–5.11)
RDW: 16.8 % — ABNORMAL HIGH (ref 11.5–15.5)
WBC: 2.8 10*3/uL — ABNORMAL LOW (ref 4.0–10.5)
nRBC: 0 % (ref 0.0–0.2)

## 2019-02-23 MED ORDER — SODIUM CHLORIDE 0.9% FLUSH
10.0000 mL | INTRAVENOUS | Status: DC | PRN
  Administered 2019-02-23: 11:00:00 10 mL
  Filled 2019-02-23: qty 10

## 2019-02-23 MED ORDER — TRASTUZUMAB CHEMO 150 MG IV SOLR
600.0000 mg | Freq: Once | INTRAVENOUS | Status: AC
  Administered 2019-02-23: 600 mg via INTRAVENOUS
  Filled 2019-02-23: qty 28.57

## 2019-02-23 MED ORDER — ACETAMINOPHEN 325 MG PO TABS
650.0000 mg | ORAL_TABLET | Freq: Once | ORAL | Status: AC
  Administered 2019-02-23: 10:00:00 650 mg via ORAL

## 2019-02-23 MED ORDER — SODIUM CHLORIDE 0.9% FLUSH
10.0000 mL | INTRAVENOUS | Status: DC | PRN
  Administered 2019-02-23: 10 mL
  Filled 2019-02-23: qty 10

## 2019-02-23 MED ORDER — TRASTUZUMAB CHEMO 150 MG IV SOLR: 8.0000 mg/kg | Freq: Once | INTRAVENOUS | Status: DC

## 2019-02-23 MED ORDER — DIPHENHYDRAMINE HCL 25 MG PO CAPS
25.0000 mg | ORAL_CAPSULE | Freq: Once | ORAL | Status: AC
  Administered 2019-02-23: 25 mg via ORAL

## 2019-02-23 MED ORDER — HEPARIN SOD (PORK) LOCK FLUSH 100 UNIT/ML IV SOLN
500.0000 [IU] | Freq: Once | INTRAVENOUS | Status: AC | PRN
  Administered 2019-02-23: 500 [IU]
  Filled 2019-02-23: qty 5

## 2019-02-23 MED ORDER — SODIUM CHLORIDE 0.9 % IV SOLN
Freq: Once | INTRAVENOUS | Status: AC
  Administered 2019-02-23: 10:00:00 via INTRAVENOUS
  Filled 2019-02-23: qty 250

## 2019-02-23 MED ORDER — DIPHENHYDRAMINE HCL 25 MG PO CAPS
ORAL_CAPSULE | ORAL | Status: AC
  Filled 2019-02-23: qty 1

## 2019-02-23 MED ORDER — ACETAMINOPHEN 325 MG PO TABS
ORAL_TABLET | ORAL | Status: AC
  Filled 2019-02-23: qty 2

## 2019-02-23 NOTE — Patient Instructions (Signed)
[facility] Discharge Instructions for Patients Receiving Chemotherapy  Today you received the following chemotherapy agents Trastuzumab (HERCEPTIN).  To help prevent nausea and vomiting after your treatment, we encourage you to take your nausea medication as prescribed.  If you develop nausea and vomiting that is not controlled by your nausea medication, call the clinic.   BELOW ARE SYMPTOMS THAT SHOULD BE REPORTED IMMEDIATELY:  *FEVER GREATER THAN 100.5 F  *CHILLS WITH OR WITHOUT FEVER  NAUSEA AND VOMITING THAT IS NOT CONTROLLED WITH YOUR NAUSEA MEDICATION  *UNUSUAL SHORTNESS OF BREATH  *UNUSUAL BRUISING OR BLEEDING  TENDERNESS IN MOUTH AND THROAT WITH OR WITHOUT PRESENCE OF ULCERS  *URINARY PROBLEMS  *BOWEL PROBLEMS  UNUSUAL RASH Items with * indicate a potential emergency and should be followed up as soon as possible.  Feel free to call the clinic should you have any questions or concerns. The clinic phone number is [phone].  Please show the CHEMO ALERT CARD at check-in to the Emergency Department and triage nurse.  Coronavirus (COVID-19) Are you at risk?  Are you at risk for the Coronavirus (COVID-19)?  To be considered HIGH RISK for Coronavirus (COVID-19), you have to meet the following criteria:  . Traveled to [place], [place], [place], [place] or [place]; or in the [place] to [place], [place], [place], or [place]; and have fever, cough, and shortness of breath within the last 2 weeks of travel OR . Been in close contact with a person diagnosed with COVID-19 within the last 2 weeks and have fever, cough, and shortness of breath . IF YOU DO NOT MEET THESE CRITERIA, YOU ARE CONSIDERED LOW RISK FOR COVID-19.  What to do if you are HIGH RISK for COVID-19?  . If you are having a medical emergency, call 911. . Seek medical care right away. Before you go to a doctor's office, urgent care or emergency department, call ahead and tell them  about your recent travel, contact with someone diagnosed with COVID-19, and your symptoms. You should receive instructions from your physician's office regarding next steps of care.  . When you arrive at healthcare provider, tell the healthcare staff immediately you have returned from visiting [place], [place], [place], [place] or [place]; or traveled in the [place] to [place], [place], [place], or [place]; in the last two weeks or you have been in close contact with a person diagnosed with COVID-19 in the last 2 weeks.   . Tell the health care staff about your symptoms: fever, cough and shortness of breath. . After you have been seen by a medical provider, you will be either: o Tested for (COVID-19) and discharged home on quarantine except to seek medical care if symptoms worsen, and asked to  - Stay home and avoid contact with others until you get your results (4-5 days)  - Avoid travel on public transportation if possible (such as bus, train, or airplane) or o Sent to the Emergency Department by EMS for evaluation, COVID-19 testing, and possible admission depending on your condition and test results.  What to do if you are LOW RISK for COVID-19?  Reduce your risk of any infection by using the same precautions used for avoiding the common cold or flu:  . Wash your hands often with soap and warm water for at least 20 seconds.  If soap and water are not readily available, use an alcohol-based hand sanitizer with at least 60% alcohol.  . If coughing or sneezing,   cover your mouth and nose by coughing or sneezing into the elbow areas of your shirt or coat, into a tissue or into your sleeve (not your hands). . Avoid shaking hands with others and consider head nods or verbal greetings only. . Avoid touching your eyes, nose, or mouth with unwashed hands.  . Avoid close contact with people who are sick. . Avoid places or events with large numbers of people in one location, like concerts or  sporting events. . Carefully consider travel plans you have or are making. . If you are planning any travel outside or inside the [place], visit the CDC's Travelers' Health webpage for the latest health notices. . If you have some symptoms but not all symptoms, continue to monitor at home and seek medical attention if your symptoms worsen. . If you are having a medical emergency, call 911.   ADDITIONAL HEALTHCARE OPTIONS FOR PATIENTS  [facility] / e-Visit: [email or web address]         [facility]: [phone]  [facility]: [phone]                   [facility]: [phone]   

## 2019-03-08 ENCOUNTER — Ambulatory Visit: Payer: Self-pay | Admitting: Surgery

## 2019-03-08 DIAGNOSIS — C50912 Malignant neoplasm of unspecified site of left female breast: Secondary | ICD-10-CM

## 2019-03-08 NOTE — H&P (Signed)
Natasha Huffman Documented: 03/08/2019 11:12 AM Location: [facility] Patient #: [id number] DOB: 10/04/1960 Married / Language: English / Race: Black or African American Female  History of Present Illness (Natasha Huffman A. Meilin [name] MD; 03/08/2019 2:00 PM) Patient words: Patient returns for follow-up after completing neoadjuvant chemotherapy for stage II left breast cancer. Magnetic resonance imaging shows significant reduction in size. She has no complaints and is ready to proceed with breast conserving surgery after discussion of options.               ADDITIONAL INFORMATION: PROGNOSTIC INDICATORS Results: IMMUNOHISTOCHEMICAL AND MORPHOMETRIC ANALYSIS PERFORMED MANUALLY The tumor cells are POSITIVE for Her2 (3+). Of note, the tumor cells are heterogenous in regards to Her2 expression. Estrogen Receptor: 100%, POSITIVE, STRONG STAINING INTENSITY Progesterone Receptor: 2%, POSITIVE, STRONG STAINING INTENSITY Proliferation Marker Ki67: 40% REFERENCE RANGE ESTROGEN RECEPTOR NEGATIVE 0% POSITIVE =>1% REFERENCE RANGE PROGESTERONE RECEPTOR NEGATIVE 0% POSITIVE =>1% All controls stained appropriately JOSHUA KISH MD Pathologist, Electronic Signature ( Signed 09/25/2018) FINAL DIAGNOSIS Diagnosis Breast, left, needle core biopsy, 2 o'clock; ribbon clip - INVASIVE DUCTAL CARCINOMA. - SEE COMMENT. 1 of 2 FINAL for Natasha Huffman ([id number]) Microscopic Comment The carcinoma appears grade III. A breast prognostic profile will be performed and the results reported separately. The results were called to [facility] on 09/24/2018. (JBK:kh 09/24/2018) JOSHUA KISH MD Pathologist, Electronic Signature (Case signed 09/24/2018)       Diagnosis Breast, left, needle core biopsy, medial - DUCTAL PAPILLOMA. Microscopic Comment The results were reported to [facility]  on 10/13/2018.         CLINICAL  DATA: 58-year-old patient has undergone neoadjuvant chemotherapy for biopsy-proven left breast cancer in the upper outer quadrant. On the breast MRI October 01, 2018, prior to chemotherapy, the biopsied malignancy measured 1.9 x 1.6 x 1.2 cm, with several adjacent small satellite lesions, with an anterior to posterior dimension of 4.2 cm, inclusive of the satellite lesions.  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  LABS: None obtained.  EXAM: BILATERAL BREAST MRI WITH AND WITHOUT CONTRAST  TECHNIQUE: Multiplanar, multisequence MR images of both breasts were obtained prior to and following the intravenous administration of 10 ml of Gadavist  Three-dimensional MR images were rendered by post-processing of the original MR data on an independent workstation. The three-dimensional MR images were interpreted, and findings are reported in the following complete MRI report for this study. Three dimensional images were evaluated at the independent DynaCad workstation  COMPARISON: Bilateral breast MRI October 01, 2018, MRI guided core needle left breast biopsy October 12, 2018 diagnostic left mammogram and left breast ultrasound February 07/2019, and screening mammogram September 23, 2018.  FINDINGS: Breast composition: b. Scattered fibroglandular tissue.  Background parenchymal enhancement: Mild  Right breast: No mass or abnormal enhancement.  Left breast: The known malignancy in the posterior third of the upper-outer quadrant of the left breast has decreased in size. It continues to enhance and measures 1.2 x 0.9 cm x 1.0 cm (AP by transverse by craniocaudal). Central biopsy clip artifact is identified within the mass. No adjacent enhancing satellite nodules are seen. The biopsy-proven papilloma in the left breast, posterior to the nipple,  is less apparent on today's breast MRI, possibly due to interval decrease in size status post MRI-guided biopsy of the mass. Stable benign intramammary lymph node in the far posterior   outer left breast near the level of the nipple. Remainder of the left breast is negative.  Lymph nodes: No abnormal appearing lymph nodes.  Ancillary findings: Port-A-Cath present in the superior right chest wall, partially imaged.  IMPRESSION: The biopsy-proven upper-outer quadrant left breast carcinoma has decreased in size and satellite nodularity has resolved. The biopsied malignancy currently measures 1.2 x 0.9 x 1.0 cm. Previously on the MRI of 10/01/2018, the biopsied mass measured 1.9 x 1.6 x 1.2 cm.  The biopsy-proven papilloma in the left breast is less apparent on today's MRI, likely due to interval biopsy.  Negative for lymphadenopathy.  No MRI evidence of malignancy in the right breast.  RECOMMENDATION: Continue treatment planning.  BI-RADS CATEGORY 6: Known biopsy-proven malignancy.   Electronically Signed By: [name] M.D. On: 02/08/2019 14:13.  The patient is a 58 year old female.   Past Surgical History (Sabrina Canty, CMA; 03/08/2019 11:12 AM) Breast Biopsy Left. Cesarean Section - 1 Colon Polyp Removal - Colonoscopy  Diagnostic Studies History (Sabrina Canty, CMA; 03/08/2019 11:12 AM) Colonoscopy 1-5 years ago Mammogram within last year Pap Smear 1-5 years ago  Allergies (Sabrina Canty, CMA; 03/08/2019 11:12 AM) No Known Allergies [09/28/2018]: No Known Drug Allergies [03/07/2015]: Allergies Reconciled  Medication History (Sabrina Canty, CMA; 03/08/2019 11:13 AM) NexIUM (20MG Capsule DR, Oral) Active. Advil (200MG Capsule, Oral) Active. Ambien (10MG Tablet, Oral) Active. Colace (100MG Capsule, Oral) Active. Ibuprofen (200MG Tablet, Oral) Active. Prochlorperazine Maleate (10MG Tablet, Oral) Active. Ciprofloxacin HCl (500MG  Tablet, Oral) Active. LORazepam (0.5MG Tablet, Oral) Active. Medications Reconciled  Social History (Sabrina Canty, CMA; 03/08/2019 11:12 AM) Caffeine use Carbonated beverages, Coffee. No alcohol use Tobacco use Never smoker.  Family History (Sabrina Canty, CMA; 03/08/2019 11:12 AM) Alcohol Abuse Father. Cerebrovascular Accident Family Members In General. Colon Cancer Family Members In General. Diabetes Mellitus Family Members In General.  Pregnancy / Birth History (Sabrina Canty, CMA; 03/08/2019 11:12 AM) Age at menarche 18 years. Age of menopause <45 46-50 Gravida 2 Irregular periods Maternal age 21-25 Para 2  Other Problems (Sabrina Canty, CMA; 03/08/2019 11:12 AM) Breast Cancer Cholelithiasis Hemorrhoids     Review of Systems (Sabrina Canty CMA; 03/08/2019 11:12 AM) General Not Present- Appetite Loss, Chills, Fatigue, Fever, Night Sweats, Weight Gain and Weight Loss. Skin Not Present- Change in Wart/Mole, Dryness, Hives, Jaundice, New Lesions, Non-Healing Wounds, Rash and Ulcer. Respiratory Not Present- Bloody sputum, Chronic Cough, Difficulty Breathing, Snoring and Wheezing. Breast Not Present- Breast Mass, Breast Pain, Nipple Discharge and Skin Changes. Cardiovascular Not Present- Chest Pain, Difficulty Breathing Lying Down, Leg Cramps, Palpitations, Rapid Heart Rate, Shortness of Breath and Swelling of Extremities. Gastrointestinal Not Present- Abdominal Pain, Bloating, Bloody Stool, Change in Bowel Habits, Chronic diarrhea, Constipation, Difficulty Swallowing, Excessive gas, Gets full quickly at meals, Hemorrhoids, Indigestion, Nausea, Rectal Pain and Vomiting. Female Genitourinary Not Present- Frequency, Nocturia, Painful Urination, Pelvic Pain and Urgency. Musculoskeletal Not Present- Back Pain, Joint Pain, Joint Stiffness, Muscle Pain, Muscle Weakness and Swelling of Extremities. Neurological Not Present- Decreased Memory, Fainting, Headaches,  Numbness, Seizures, Tingling, Tremor, Trouble walking and Weakness. Psychiatric Not Present- Anxiety, Bipolar, Change in Sleep Pattern, Depression, Fearful and Frequent crying. Endocrine Not Present- Cold Intolerance, Excessive Hunger, Hair Changes, Heat Intolerance, Hot flashes and New Diabetes. Hematology Not Present- Blood Thinners, Easy Bruising, Excessive bleeding, Gland problems, HIV and Persistent Infections.  Vitals (Sabrina Canty CMA; 03/08/2019 11:14 AM) 03/08/2019 11:13 AM Weight: 200.25 lb Height: 62in Body Surface Area: 1.91 m Body Mass Index: 36.63 kg/m  Temp.:   97.4F(Oral)  Pulse: 94 (Regular)  BP: 142/62 (Sitting, Left Arm, Standard)        Physical Exam (Zoria Rawlinson A. Aleshia Cartelli MD; 03/08/2019 2:00 PM)  General Mental Status-Alert. General Appearance-Consistent with stated age. Hydration-Well hydrated. Voice-Normal.  Head and Neck Head-normocephalic, atraumatic with no lesions or palpable masses. Trachea-midline. Thyroid Gland Characteristics - normal size and consistency.  Chest and Lung Exam Chest and lung exam reveals -quiet, even and easy respiratory effort with no use of accessory muscles and on auscultation, normal breath sounds, no adventitious sounds and normal vocal resonance. Inspection Chest Wall - Normal. Back - normal.  Breast Breast - Left-Symmetric, Non Tender, No Biopsy scars, no Dimpling - Left, No Inflammation, No Lumpectomy scars, No Mastectomy scars, No Peau d' Orange. Breast - Right-Symmetric, Non Tender, No Biopsy scars, no Dimpling - Right, No Inflammation, No Lumpectomy scars, No Mastectomy scars, No Peau d' Orange. Breast Lump-No Palpable Breast Mass.  Lymphatic Head & Neck  General Head & Neck Lymphatics: Bilateral - Description - Normal. Axillary  General Axillary Region: Bilateral - Description - Normal. Tenderness - Non Tender.    Assessment & Plan (Kennya Schwenn A. Clarice Zulauf MD;  03/08/2019 2:00 PM)  BREAST CANCER, LEFT (C50.912) Impression: Patient opted for left breast lumpectomy which we bracketed as well as removal of the papilloma left breast. She will undergo sentinel lymph node mapping as well. Risk of lumpectomy include bleeding, infection, seroma, more surgery, use of seed/wire, wound care, cosmetic deformity and the need for other treatments, death , blood clots, death. Pt agrees to proceed. Risk of sentinel lymph node mapping include bleeding, infection, lymphedema, shoulder pain. stiffness, dye allergy. cosmetic deformity , blood clots, death, need for more surgery. Pt agres to proceed.  Current Plans You are being scheduled for surgery- Our schedulers will call you.  You should hear from our office's scheduling department within 5 working days about the location, date, and time of surgery. We try to make accommodations for patient's preferences in scheduling surgery, but sometimes the OR schedule or the surgeon's schedule prevents us from making those accommodations.  If you have not heard from our office ([phone]) in 5 working days, call the office and ask for your surgeon's nurse.  If you have other questions about your diagnosis, plan, or surgery, call the office and ask for your surgeon's nurse.  Pt Education - [facility] Breast Biopsy HCI: discussed with patient and provided information. Pt Education - [facility] breast cancer surgery: discussed with patient and provided information. We discussed the staging and pathophysiology of breast cancer. We discussed all of the different options for treatment for breast cancer including surgery, chemotherapy, radiation therapy, Herceptin, and antiestrogen therapy. We discussed a sentinel lymph node biopsy as she does not appear to having lymph node involvement right now. We discussed the performance of that with injection of radioactive tracer and blue dye. We discussed that she would have an incision underneath her  axillary hairline. We discussed that there is a bout a 10-20% chance of having a positive node with a sentinel lymph node biopsy and we will await the permanent pathology to make any other first further decisions in terms of her treatment. One of these options might be to return to the operating room to perform an axillary lymph node dissection. We discussed about a 1-2% risk lifetime of chronic shoulder pain as well as lymphedema associated with a sentinel lymph node biopsy. We discussed the options for treatment of the breast cancer which included lumpectomy versus a   mastectomy. We discussed the performance of the lumpectomy with a wire placement. We discussed a 10-20% chance of a positive margin requiring reexcision in the operating room. We also discussed that she may need radiation therapy or antiestrogen therapy or both if she undergoes lumpectomy. We discussed the mastectomy and the postoperative care for that as well. We discussed that there is no difference in her survival whether she undergoes lumpectomy with radiation therapy or antiestrogen therapy versus a mastectomy. There is a slight difference in the local recurrence rate being 3-5% with lumpectomy and about 1% with a mastectomy. We discussed the risks of operation including bleeding, infection, possible reoperation. She understands her further therapy will be based on what her stages at the time of her operation.         

## 2019-03-11 ENCOUNTER — Other Ambulatory Visit: Payer: Self-pay | Admitting: Surgery

## 2019-03-11 DIAGNOSIS — C50912 Malignant neoplasm of unspecified site of left female breast: Secondary | ICD-10-CM

## 2019-03-15 ENCOUNTER — Other Ambulatory Visit: Payer: Self-pay | Admitting: *Deleted

## 2019-03-15 DIAGNOSIS — Z170 Estrogen receptor positive status [ER+]: Secondary | ICD-10-CM

## 2019-03-15 DIAGNOSIS — C50412 Malignant neoplasm of upper-outer quadrant of left female breast: Secondary | ICD-10-CM

## 2019-03-15 NOTE — Progress Notes (Signed)
[facility]  Telephone:([phone]) [phone] Fax:(336) [phone]    ID: Natasha Huffman DOB: 06/29/1961  MR#: [id number]  KXF#:[id number]  Patient Care Team: [name], MD as PCP - General (Family Medicine) Rockwell Germany, RN as Oncology Nurse Navigator Mauro Kaufmann, RN as Oncology Nurse Navigator [name], MD as Consulting Physician (General Surgery) Marquavion Venhuizen, [name], MD as Consulting Physician (Oncology) Kyung Rudd, MD as Consulting Physician (Radiation Oncology) Larey Dresser, MD as Consulting Physician (Cardiology) OTHER MD:    CHIEF COMPLAINT: Triple positive breast cancer  CURRENT TREATMENT: Continuing trastuzumab; awaiting definitive surgery   INTERVAL HISTORY: Natasha Huffman returns today for follow-up and treatment of her HER-2 positive breast cancer.   Since her last visit, she underwent bilateral breast MRI on 02/08/2019, showing: the biopsy-proven left breast upper-outer quadrant carcinoma has decreased in size, satellite nodularity has resolved; negative for lymphadenopathy; no evidence of malignancy in the right breast.  She completed her chemotherapy and continues on Herceptin starting 02/23/2019, with a dose due today.  Her most recent echocardiogram on 01/25/2019 showed an ejection fraction of >65%.  She is scheduled to undergo left breast lumpectomy on 04/08/2019 under Dr. Brantley Stage.   REVIEW OF SYSTEMS: Dalasia reports her coworkers came to the hospital to celebrate with her after she finished her last chemotherapy. She reports some numbness to both feet, right more than left, in her toes and heels. She notes her hair is starting to grow back. She states she is walking at least a mile twice a week, "as long as my feet will let me." A detailed review of systems was otherwise entirely negative.   HISTORY OF CURRENT ILLNESS: From the original intake note:  Natasha Huffman had routine screening mammography on 09/23/2018 showing a possible abnormality  in the left breast. She underwent unilateral left diagnostic mammography with tomography and left breast ultrasonography at [facility] on 09/23/2018 showing: Breast Density Category B. There is a mass with irregular margins in the upper-outer quadrant of the left breast. On physical exam, there is palpable focal thickening in the 2 o'clock location of the left breast. Sonographically, an irregular hypoechoic mass with internal vascularity in the 2 o'clock location of the left breast 10 cm from the nipple is seen. The mass measures 1.8 x 1.1 x 1.0 cm. There is associated posterior acoustic enhancement. Evaluation of the left axilla is negative for adenopathy.  Accordingly on 09/23/2018 she proceeded to biopsy of the left breast area in question. The pathology from this procedure showed ([id number]): invasive ductal carcinoma, grade III. Prognostic indicators significant for: estrogen receptor, 100% positive and progesterone receptor, 2% positive, both with strong staining intensity. Proliferation marker Ki67 at 40%. HER2 positive (3+) by immunohistochemistry.   The patient's subsequent history is as detailed above.   PAST MEDICAL HISTORY: Past Medical History:  Diagnosis Date   Cancer ([place]) 09/23/2018   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 ,    Gallstones    GERD (gastroesophageal reflux disease)    resolved      PAST SURGICAL HISTORY: Past Surgical History:  Procedure Laterality Date   CESAREAN SECTION  1990; 1995   CHOLECYSTECTOMY N/A 02/22/2015   Procedure: LAPAROSCOPIC CHOLECYSTECTOMY WITH INTRAOPERATIVE CHOLANGIOGRAM;  Surgeon: [name], MD;  Location: [facility];  Service: General;  Laterality: N/A;   LAPAROSCOPIC CHOLECYSTECTOMY  02/22/2015   PORTACATH PLACEMENT Right 10/08/2018   Procedure: INSERTION PORT-A-CATH WITH ULTRASOUND;  Surgeon: [name], MD;  Location: [facility];  Service: General;  Laterality: Right;   TUBAL LIGATION  1995     FAMILY HISTORY: Family  History  Problem Relation Age of Onset   Hypertension Sister    Colon cancer Maternal Aunt    Breast cancer Neg Hx    Natasha Huffman's father died from sepsis at age 60. Patients' mother is 39 years old as of 09/2018. The patient has 3 brothers and 5 sisters. Patient denies anyone in her family having breast, ovarian, prostate, or pancreatic cancer. Terre's maternal aunt was diagnosed with colon cancer at age 36.    GYNECOLOGIC HISTORY:  No LMP recorded. Patient is postmenopausal. 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 Her husband, [name], works at a [employer] in [place]. 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 [name] has two children, [name] and [name]. [name] is 2, lives in [place], [place], and is a Animator. [name] is 83, lives in [place], and works for the [employer]. Soniya has one grandchild. She attends the [employer] in [place]: Natasha Huffman's husband, [name], is automatically her healthcare power of attorney.     HEALTH MAINTENANCE: Social History   Tobacco Use   Smoking status: Never Smoker   Smokeless tobacco: Never Used  Substance Use Topics   Alcohol use: No   Drug use: No    Colonoscopy: yes, 2015, [facility]  PAP: 09/2017  Bone density: no   No Known Allergies  Current Outpatient Medications  Medication Sig Dispense Refill   ciprofloxacin (CIPRO) 500 MG tablet Take 1 tablet (500 mg total) by mouth 2 (two) times daily. 14 tablet 4   dexamethasone (DECADRON) 4 MG tablet Take 2 tablets (8 mg total) by mouth 2 (two) times daily. Start the day before Taxotere. Take once the day after, then 2 times a day x 2d. (Patient not taking: Reported on 02/02/2019) 30 tablet 1    fluconazole (DIFLUCAN) 100 MG tablet TAKE 1 TABLET (100 MG TOTAL) BY MOUTH DAILY. 21 tablet 0   lidocaine-prilocaine (EMLA) cream Apply to affected area once 30 g 3   LORazepam (ATIVAN) 0.5 MG tablet TAKE 1 TABLET(0.5 MG) BY MOUTH AT BEDTIME AS NEEDED FOR NAUSEA OR VOMITING 20 tablet 0   prochlorperazine (COMPAZINE) 10 MG tablet TAKE 1 TABLET(10 MG) BY MOUTH EVERY 6 HOURS AS NEEDED FOR NAUSEA OR VOMITING 30 tablet 1   No current facility-administered medications for this visit.       OBJECTIVE:    Middle-aged African-American woman in no acute distress Vitals:   03/16/19 1009  BP: (!) 136/95  Pulse: 77  Resp: 18  Temp: 98.5 F (36.9 C)   Body mass index is 37.13 kg/m. Wt Readings from Last 3 Encounters:  03/16/19 203 lb (92.1 kg)  02/02/19 205 lb 1.6 oz (93 kg)  01/11/19 207 lb (93.9 kg)  ECOG FS: 1  Sclerae unicteric, EOMs intact I, Lurline Del MD, have reviewed the above documentation for accuracy and completeness, and I agree with the above.  No cervical or supraclavicular adenopathy Lungs no rales or rhonchi Heart regular rate and rhythm Abd soft, nontender, positive bowel sounds MSK no focal spinal tenderness, no upper extremity lymphedema Neuro: nonfocal, well oriented, appropriate affect Breasts: I do not feel a palpable mass  in either breast.  There are no skin or nipple changes of concern.  Both axillae are benign.  LAB RESULTS:  CMP     Component Value Date/Time   NA 144 02/23/2019 0827   K 3.9 02/23/2019 0827   CL 110 02/23/2019 0827   CO2 25 02/23/2019 0827   GLUCOSE 105 (H) 02/23/2019 0827   BUN 11 02/23/2019 0827   CREATININE 0.71 02/23/2019 0827   CREATININE 0.71 01/11/2019 0815   CALCIUM 8.6 (L) 02/23/2019 0827   PROT 6.3 (L) 02/23/2019 0827   ALBUMIN 3.8 02/23/2019 0827   AST 29 02/23/2019 0827   AST 19 01/11/2019 0815   ALT 46 (H) 02/23/2019 0827   ALT 42 01/11/2019 0815   ALKPHOS 96 02/23/2019 0827   BILITOT 0.3 02/23/2019 0827    BILITOT 0.3 01/11/2019 0815   GFRNONAA >60 02/23/2019 0827   GFRNONAA >60 01/11/2019 0815   GFRAA >60 02/23/2019 0827   GFRAA >60 01/11/2019 0815    No results found for: TOTALPROTELP, ALBUMINELP, A1GS, A2GS, BETS, BETA2SER, GAMS, MSPIKE, SPEI  No results found for: KPAFRELGTCHN, LAMBDASER, [facility]  Lab Results  Component Value Date   WBC 4.2 03/16/2019   NEUTROABS 2.4 03/16/2019   HGB 10.7 (L) 03/16/2019   HCT 33.4 (L) 03/16/2019   MCV 90.5 03/16/2019   PLT 218 03/16/2019    _0 @  No results found for: LABCA2  No components found for: [id number]  No results for input(s): INR in the last 168 hours.  No results found for: LABCA2  No results found for: JJK093  No results found for: GHW299  No results found for: BZJ696  No results found for: CA2729  No components found for: HGQUANT  No results found for: CEA1 / No results found for: CEA1   No results found for: AFPTUMOR  No results found for: CHROMOGRNA  No results found for: PSA1  Appointment on 03/16/2019  Component Date Value Ref Range Status   WBC 03/16/2019 4.2  4.0 - 10.5 K/uL Final   RBC 03/16/2019 3.69* 3.87 - 5.11 MIL/uL Final   Hemoglobin 03/16/2019 10.7* 12.0 - 15.0 g/dL Final   HCT 03/16/2019 33.4* 36.0 - 46.0 % Final   MCV 03/16/2019 90.5  80.0 - 100.0 fL Final   MCH 03/16/2019 29.0  26.0 - 34.0 pg Final   MCHC 03/16/2019 32.0  30.0 - 36.0 g/dL Final   RDW 03/16/2019 12.7  11.5 - 15.5 % Final   Platelets 03/16/2019 218  150 - 400 K/uL Final   nRBC 03/16/2019 0.0  0.0 - 0.2 % Final   Neutrophils Relative % 03/16/2019 56  % Final   Neutro Abs 03/16/2019 2.4  1.7 - 7.7 K/uL Final   Lymphocytes Relative 03/16/2019 31  % Final   Lymphs Abs 03/16/2019 1.3  0.7 - 4.0 K/uL Final   Monocytes Relative 03/16/2019 9  % Final   Monocytes Absolute 03/16/2019 0.4  0.1 - 1.0 K/uL Final   Eosinophils Relative 03/16/2019 3  % Final   Eosinophils Absolute 03/16/2019 0.1  0.0  - 0.5 K/uL Final   Basophils Relative 03/16/2019 1  % Final   Basophils Absolute 03/16/2019 0.0  0.0 - 0.1 K/uL Final   Immature Granulocytes 03/16/2019 0  % Final   Abs Immature Granulocytes 03/16/2019 0.00  0.00 - 0.07 K/uL Final   Performed at [facility], [place] [address] [place], [place] [address]    (this displays the last labs from the last 3 days)  No results found for: TOTALPROTELP, ALBUMINELP, A1GS, A2GS, BETS, BETA2SER, GAMS, MSPIKE, SPEI (this displays SPEP labs)  No results found for: KPAFRELGTCHN, LAMBDASER, KAPLAMBRATIO (kappa/lambda light chains)  No results found for: HGBA, HGBA2QUANT, HGBFQUANT, HGBSQUAN (Hemoglobinopathy evaluation)   No results found for: LDH  No results found for: IRON, TIBC, IRONPCTSAT (Iron and TIBC)  No results found for: FERRITIN  Urinalysis    Component Value Date/Time   COLORURINE YELLOW 02/22/2015 0118   APPEARANCEUR CLOUDY (A) 02/22/2015 0118   LABSPEC 1.019 02/22/2015 0118   PHURINE 7.0 02/22/2015 0118   GLUCOSEU NEGATIVE 02/22/2015 0118   HGBUR NEGATIVE 02/22/2015 0118   BILIRUBINUR NEGATIVE 02/22/2015 0118   KETONESUR NEGATIVE 02/22/2015 0118   PROTEINUR NEGATIVE 02/22/2015 0118   UROBILINOGEN 1.0 02/22/2015 0118   NITRITE NEGATIVE 02/22/2015 0118   LEUKOCYTESUR NEGATIVE 02/22/2015 0118     STUDIES:  No results found.   ELIGIBLE FOR AVAILABLE RESEARCH PROTOCOL: no   ASSESSMENT: 58 y.o. [name], [place] woman status post left breast upper outer quadrant biopsy 09/23/2018 for a clinical T1c N0, stage IA invasive ductal carcinoma, grade 3, triple positive, with an MIB-1 of 40%  (1) neoadjuvant chemotherapy consisting of carboplatin, docetaxel, and trastuzumab started 10/22/2018, to be repeated every 21 days x 6  (a) echocardiogram on 10/07/2018 shows a LVEF of 60-65%.  (b) docetaxel discontinued and gemcitabine substituted with cycle 5 because of neuropathy  (2) trastuzumab to continue  to complete a year (through March 2021)  (a) echocardiogram 01/25/2019 showed an ejection fraction of greater than 65%  (3) definitive surgery 04/08/2019  (4) adjuvant radiation to follow-up  (5) antiestrogens to start at the completion of local treatment    PLAN: Rosemaria did very well with her chemotherapy and continues on trastuzumab through March 2021.  Today I showed her images of her breast MRI, which are very encouraging.  Her hair is growing back, which is very gratifying.  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  She had an echocardiogram in June and will need 1 late September or early October  Once she recovers from her surgery she will be ready to proceed to radiation.  We are going to hold antiestrogens until she recovers from radiation  She tells me she is considering colonoscopy.  I think anytime from late September on would be fine as far as the colonoscopy is concerned.  She understands the trastuzumab does not affect clotting or counts so is no need to hold the drug for any procedure or for her upcoming surgery  We discussed exercise goals and she understands 45 minutes 5 times a week is her goal.  She will see [place] again in late September.  She will have a bone density prior to that visit  She knows to call for any other issue that may develop before then.   [name]. Darrielle Pflieger, MD  03/16/19 10:31 AM Medical Oncology and Hematology [facility] [address] [place], [place] [address] Tel. [phone]    Fax. [phone]   I, Wilburn Mylar, am acting as scribe for Dr. [name]. Jurney Overacker.  I, Lurline Del MD, have reviewed the above documentation for accuracy and completeness, and I agree with the above.

## 2019-03-16 ENCOUNTER — Inpatient Hospital Stay: Payer: 59

## 2019-03-16 ENCOUNTER — Other Ambulatory Visit: Payer: Self-pay

## 2019-03-16 ENCOUNTER — Inpatient Hospital Stay (HOSPITAL_BASED_OUTPATIENT_CLINIC_OR_DEPARTMENT_OTHER): Payer: 59 | Admitting: Oncology

## 2019-03-16 ENCOUNTER — Inpatient Hospital Stay: Payer: 59 | Attending: Oncology

## 2019-03-16 VITALS — BP 136/95 | HR 77 | Temp 98.5°F | Resp 18 | Ht 62.0 in | Wt 203.0 lb

## 2019-03-16 DIAGNOSIS — Z170 Estrogen receptor positive status [ER+]: Secondary | ICD-10-CM

## 2019-03-16 DIAGNOSIS — G620 Drug-induced polyneuropathy: Secondary | ICD-10-CM | POA: Insufficient documentation

## 2019-03-16 DIAGNOSIS — Z5112 Encounter for antineoplastic immunotherapy: Secondary | ICD-10-CM | POA: Insufficient documentation

## 2019-03-16 DIAGNOSIS — Z95828 Presence of other vascular implants and grafts: Secondary | ICD-10-CM

## 2019-03-16 DIAGNOSIS — C50412 Malignant neoplasm of upper-outer quadrant of left female breast: Secondary | ICD-10-CM

## 2019-03-16 DIAGNOSIS — K219 Gastro-esophageal reflux disease without esophagitis: Secondary | ICD-10-CM | POA: Diagnosis not present

## 2019-03-16 DIAGNOSIS — Z79899 Other long term (current) drug therapy: Secondary | ICD-10-CM | POA: Insufficient documentation

## 2019-03-16 DIAGNOSIS — T451X5A Adverse effect of antineoplastic and immunosuppressive drugs, initial encounter: Secondary | ICD-10-CM | POA: Insufficient documentation

## 2019-03-16 DIAGNOSIS — Z780 Asymptomatic menopausal state: Secondary | ICD-10-CM | POA: Insufficient documentation

## 2019-03-16 DIAGNOSIS — Z800 Family history of malignant neoplasm of digestive organs: Secondary | ICD-10-CM | POA: Insufficient documentation

## 2019-03-16 DIAGNOSIS — Z9221 Personal history of antineoplastic chemotherapy: Secondary | ICD-10-CM | POA: Diagnosis not present

## 2019-03-16 HISTORY — DX: Adverse effect of antineoplastic and immunosuppressive drugs, initial encounter: G62.0

## 2019-03-16 HISTORY — DX: Adverse effect of antineoplastic and immunosuppressive drugs, initial encounter: T45.1X5A

## 2019-03-16 LAB — COMPREHENSIVE METABOLIC PANEL
ALT: 18 U/L (ref 0–44)
AST: 16 U/L (ref 15–41)
Albumin: 3.9 g/dL (ref 3.5–5.0)
Alkaline Phosphatase: 123 U/L (ref 38–126)
Anion gap: 8 (ref 5–15)
BUN: 13 mg/dL (ref 6–20)
CO2: 26 mmol/L (ref 22–32)
Calcium: 9.1 mg/dL (ref 8.9–10.3)
Chloride: 107 mmol/L (ref 98–111)
Creatinine, Ser: 0.77 mg/dL (ref 0.44–1.00)
GFR calc Af Amer: >60 mL/min (ref >60)
GFR calc non Af Amer: >60 mL/min (ref >60)
Glucose, Bld: 103 mg/dL — ABNORMAL HIGH (ref 70–99)
Potassium: 4.1 mmol/L (ref 3.5–5.1)
Sodium: 141 mmol/L (ref 135–145)
Total Bilirubin: 0.3 mg/dL (ref 0.3–1.2)
Total Protein: 6.8 g/dL (ref 6.5–8.1)

## 2019-03-16 LAB — CBC WITH DIFFERENTIAL/PLATELET
Abs Immature Granulocytes: 0 10*3/uL (ref 0.00–0.07)
Basophils Absolute: 0 10*3/uL (ref 0.0–0.1)
Basophils Relative: 1 %
Eosinophils Absolute: 0.1 10*3/uL (ref 0.0–0.5)
Eosinophils Relative: 3 %
HCT: 33.4 % — ABNORMAL LOW (ref 36.0–46.0)
Hemoglobin: 10.7 g/dL — ABNORMAL LOW (ref 12.0–15.0)
Immature Granulocytes: 0 %
Lymphocytes Relative: 31 %
Lymphs Abs: 1.3 10*3/uL (ref 0.7–4.0)
MCH: 29 pg (ref 26.0–34.0)
MCHC: 32 g/dL (ref 30.0–36.0)
MCV: 90.5 fL (ref 80.0–100.0)
Monocytes Absolute: 0.4 10*3/uL (ref 0.1–1.0)
Monocytes Relative: 9 %
Neutro Abs: 2.4 10*3/uL (ref 1.7–7.7)
Neutrophils Relative %: 56 %
Platelets: 218 10*3/uL (ref 150–400)
RBC: 3.69 MIL/uL — ABNORMAL LOW (ref 3.87–5.11)
RDW: 12.7 % (ref 11.5–15.5)
WBC: 4.2 10*3/uL (ref 4.0–10.5)
nRBC: 0 % (ref 0.0–0.2)

## 2019-03-16 MED ORDER — SODIUM CHLORIDE 0.9% FLUSH
10.0000 mL | INTRAVENOUS | Status: DC | PRN
  Administered 2019-03-16: 10:00:00 10 mL
  Filled 2019-03-16: qty 10

## 2019-03-16 MED ORDER — HEPARIN SOD (PORK) LOCK FLUSH 100 UNIT/ML IV SOLN
500.0000 [IU] | Freq: Once | INTRAVENOUS | Status: AC | PRN
  Administered 2019-03-16: 500 [IU]
  Filled 2019-03-16: qty 5

## 2019-03-16 MED ORDER — ACETAMINOPHEN 325 MG PO TABS
ORAL_TABLET | ORAL | Status: AC
  Filled 2019-03-16: qty 2

## 2019-03-16 MED ORDER — ACETAMINOPHEN 325 MG PO TABS
650.0000 mg | ORAL_TABLET | Freq: Once | ORAL | Status: AC
  Administered 2019-03-16: 650 mg via ORAL

## 2019-03-16 MED ORDER — DIPHENHYDRAMINE HCL 25 MG PO CAPS
25.0000 mg | ORAL_CAPSULE | Freq: Once | ORAL | Status: AC
  Administered 2019-03-16: 25 mg via ORAL

## 2019-03-16 MED ORDER — SODIUM CHLORIDE 0.9% FLUSH
10.0000 mL | INTRAVENOUS | Status: DC | PRN
  Administered 2019-03-16: 10 mL
  Filled 2019-03-16: qty 10

## 2019-03-16 MED ORDER — TRASTUZUMAB CHEMO 150 MG IV SOLR
600.0000 mg | Freq: Once | INTRAVENOUS | Status: AC
  Administered 2019-03-16: 600 mg via INTRAVENOUS
  Filled 2019-03-16: qty 28.57

## 2019-03-16 MED ORDER — DIPHENHYDRAMINE HCL 25 MG PO CAPS
ORAL_CAPSULE | ORAL | Status: AC
  Filled 2019-03-16: qty 1

## 2019-03-16 MED ORDER — SODIUM CHLORIDE 0.9 % IV SOLN
Freq: Once | INTRAVENOUS | Status: AC
  Administered 2019-03-16: 11:00:00 via INTRAVENOUS
  Filled 2019-03-16: qty 250

## 2019-03-16 NOTE — Addendum Note (Signed)
Addended by: Tora Kindred on: 03/16/2019 02:30 PM   Modules accepted: Orders

## 2019-03-16 NOTE — Patient Instructions (Signed)
[facility] Discharge Instructions for Patients Receiving Chemotherapy  Today you received the following chemotherapy agents Trastuzumab (HERCEPTIN).  To help prevent nausea and vomiting after your treatment, we encourage you to take your nausea medication as prescribed.  If you develop nausea and vomiting that is not controlled by your nausea medication, call the clinic.   BELOW ARE SYMPTOMS THAT SHOULD BE REPORTED IMMEDIATELY:  *FEVER GREATER THAN 100.5 F  *CHILLS WITH OR WITHOUT FEVER  NAUSEA AND VOMITING THAT IS NOT CONTROLLED WITH YOUR NAUSEA MEDICATION  *UNUSUAL SHORTNESS OF BREATH  *UNUSUAL BRUISING OR BLEEDING  TENDERNESS IN MOUTH AND THROAT WITH OR WITHOUT PRESENCE OF ULCERS  *URINARY PROBLEMS  *BOWEL PROBLEMS  UNUSUAL RASH Items with * indicate a potential emergency and should be followed up as soon as possible.  Feel free to call the clinic should you have any questions or concerns. The clinic phone number is [phone].  Please show the CHEMO ALERT CARD at check-in to the Emergency Department and triage nurse.  Coronavirus (COVID-19) Are you at risk?  Are you at risk for the Coronavirus (COVID-19)?  To be considered HIGH RISK for Coronavirus (COVID-19), you have to meet the following criteria:  . Traveled to [place], [place], [place], [place] or [place]; or in the [place] to [place], [place], [place], or [place]; and have fever, cough, and shortness of breath within the last 2 weeks of travel OR . Been in close contact with a person diagnosed with COVID-19 within the last 2 weeks and have fever, cough, and shortness of breath . IF YOU DO NOT MEET THESE CRITERIA, YOU ARE CONSIDERED LOW RISK FOR COVID-19.  What to do if you are HIGH RISK for COVID-19?  . If you are having a medical emergency, call 911. . Seek medical care right away. Before you go to a doctor's office, urgent care or emergency department, call ahead and tell them  about your recent travel, contact with someone diagnosed with COVID-19, and your symptoms. You should receive instructions from your physician's office regarding next steps of care.  . When you arrive at healthcare provider, tell the healthcare staff immediately you have returned from visiting [place], [place], [place], [place] or [place]; or traveled in the [place] to [place], [place], [place], or [place]; in the last two weeks or you have been in close contact with a person diagnosed with COVID-19 in the last 2 weeks.   . Tell the health care staff about your symptoms: fever, cough and shortness of breath. . After you have been seen by a medical provider, you will be either: o Tested for (COVID-19) and discharged home on quarantine except to seek medical care if symptoms worsen, and asked to  - Stay home and avoid contact with others until you get your results (4-5 days)  - Avoid travel on public transportation if possible (such as bus, train, or airplane) or o Sent to the Emergency Department by EMS for evaluation, COVID-19 testing, and possible admission depending on your condition and test results.  What to do if you are LOW RISK for COVID-19?  Reduce your risk of any infection by using the same precautions used for avoiding the common cold or flu:  . Wash your hands often with soap and warm water for at least 20 seconds.  If soap and water are not readily available, use an alcohol-based hand sanitizer with at least 60% alcohol.  . If coughing or sneezing,   cover your mouth and nose by coughing or sneezing into the elbow areas of your shirt or coat, into a tissue or into your sleeve (not your hands). . Avoid shaking hands with others and consider head nods or verbal greetings only. . Avoid touching your eyes, nose, or mouth with unwashed hands.  . Avoid close contact with people who are sick. . Avoid places or events with large numbers of people in one location, like concerts or  sporting events. . Carefully consider travel plans you have or are making. . If you are planning any travel outside or inside the [place], visit the CDC's Travelers' Health webpage for the latest health notices. . If you have some symptoms but not all symptoms, continue to monitor at home and seek medical attention if your symptoms worsen. . If you are having a medical emergency, call 911.   ADDITIONAL HEALTHCARE OPTIONS FOR PATIENTS  [facility] / e-Visit: [email or web address]         [facility]: [phone]  [facility]: [phone]                   [facility]: [phone]   

## 2019-03-16 NOTE — Progress Notes (Signed)
The following biosimilar Ogivri (trastuzumab-dkst) has been selected for use in this patient.  This will begin w/ her next tx (not yet scheduled).  Kennith Center, Pharm.D., CPP 03/16/2019@2 :25 PM

## 2019-04-02 ENCOUNTER — Encounter (HOSPITAL_BASED_OUTPATIENT_CLINIC_OR_DEPARTMENT_OTHER): Payer: Self-pay | Admitting: *Deleted

## 2019-04-02 ENCOUNTER — Other Ambulatory Visit: Payer: Self-pay

## 2019-04-05 ENCOUNTER — Other Ambulatory Visit (HOSPITAL_COMMUNITY)
Admission: RE | Admit: 2019-04-05 | Discharge: 2019-04-05 | Disposition: A | Discharge status: Home / Self Care | Payer: 59 | Source: Ambulatory Visit | Attending: Surgery | Admitting: Surgery

## 2019-04-05 DIAGNOSIS — Z20828 Contact with and (suspected) exposure to other viral communicable diseases: Secondary | ICD-10-CM | POA: Diagnosis not present

## 2019-04-05 DIAGNOSIS — Z01812 Encounter for preprocedural laboratory examination: Secondary | ICD-10-CM | POA: Diagnosis not present

## 2019-04-05 LAB — SARS CORONAVIRUS 2 (TAT 6-24 HRS): SARS Coronavirus 2: NEGATIVE

## 2019-04-05 NOTE — Progress Notes (Signed)

## 2019-04-06 ENCOUNTER — Other Ambulatory Visit: Payer: Self-pay | Admitting: Oncology

## 2019-04-06 ENCOUNTER — Other Ambulatory Visit: Payer: Self-pay

## 2019-04-06 ENCOUNTER — Inpatient Hospital Stay: Payer: 59

## 2019-04-06 VITALS — BP 147/84 | HR 85 | Temp 97.8°F | Resp 18

## 2019-04-06 DIAGNOSIS — Z170 Estrogen receptor positive status [ER+]: Secondary | ICD-10-CM | POA: Diagnosis not present

## 2019-04-06 DIAGNOSIS — C50412 Malignant neoplasm of upper-outer quadrant of left female breast: Secondary | ICD-10-CM | POA: Diagnosis not present

## 2019-04-06 DIAGNOSIS — Z95828 Presence of other vascular implants and grafts: Secondary | ICD-10-CM

## 2019-04-06 DIAGNOSIS — Z9221 Personal history of antineoplastic chemotherapy: Secondary | ICD-10-CM | POA: Diagnosis not present

## 2019-04-06 DIAGNOSIS — Z79899 Other long term (current) drug therapy: Secondary | ICD-10-CM | POA: Diagnosis not present

## 2019-04-06 DIAGNOSIS — T451X5A Adverse effect of antineoplastic and immunosuppressive drugs, initial encounter: Secondary | ICD-10-CM

## 2019-04-06 DIAGNOSIS — G620 Drug-induced polyneuropathy: Secondary | ICD-10-CM | POA: Diagnosis not present

## 2019-04-06 DIAGNOSIS — Z780 Asymptomatic menopausal state: Secondary | ICD-10-CM | POA: Diagnosis not present

## 2019-04-06 DIAGNOSIS — K219 Gastro-esophageal reflux disease without esophagitis: Secondary | ICD-10-CM | POA: Diagnosis not present

## 2019-04-06 DIAGNOSIS — Z5112 Encounter for antineoplastic immunotherapy: Secondary | ICD-10-CM | POA: Diagnosis not present

## 2019-04-06 LAB — COMPREHENSIVE METABOLIC PANEL
ALT: 21 U/L (ref 0–44)
AST: 24 U/L (ref 15–41)
Albumin: 3.9 g/dL (ref 3.5–5.0)
Alkaline Phosphatase: 109 U/L (ref 38–126)
Anion gap: 12 (ref 5–15)
BUN: 15 mg/dL (ref 6–20)
CO2: 23 mmol/L (ref 22–32)
Calcium: 9.3 mg/dL (ref 8.9–10.3)
Chloride: 106 mmol/L (ref 98–111)
Creatinine, Ser: 0.82 mg/dL (ref 0.44–1.00)
GFR calc Af Amer: >60 mL/min (ref >60)
GFR calc non Af Amer: >60 mL/min (ref >60)
Glucose, Bld: 113 mg/dL — ABNORMAL HIGH (ref 70–99)
Potassium: 4.5 mmol/L (ref 3.5–5.1)
Sodium: 141 mmol/L (ref 135–145)
Total Bilirubin: 0.3 mg/dL (ref 0.3–1.2)
Total Protein: 7.3 g/dL (ref 6.5–8.1)

## 2019-04-06 LAB — CBC WITH DIFFERENTIAL/PLATELET
Abs Immature Granulocytes: 0.02 10*3/uL (ref 0.00–0.07)
Basophils Absolute: 0 10*3/uL (ref 0.0–0.1)
Basophils Relative: 0 %
Eosinophils Absolute: 0.1 10*3/uL (ref 0.0–0.5)
Eosinophils Relative: 3 %
HCT: 36.2 % (ref 36.0–46.0)
Hemoglobin: 11.5 g/dL — ABNORMAL LOW (ref 12.0–15.0)
Immature Granulocytes: 0 %
Lymphocytes Relative: 36 %
Lymphs Abs: 1.6 10*3/uL (ref 0.7–4.0)
MCH: 28.1 pg (ref 26.0–34.0)
MCHC: 31.8 g/dL (ref 30.0–36.0)
MCV: 88.5 fL (ref 80.0–100.0)
Monocytes Absolute: 0.3 10*3/uL (ref 0.1–1.0)
Monocytes Relative: 7 %
Neutro Abs: 2.4 10*3/uL (ref 1.7–7.7)
Neutrophils Relative %: 54 %
Platelets: 252 10*3/uL (ref 150–400)
RBC: 4.09 MIL/uL (ref 3.87–5.11)
RDW: 11.9 % (ref 11.5–15.5)
WBC: 4.5 10*3/uL (ref 4.0–10.5)
nRBC: 0 % (ref 0.0–0.2)

## 2019-04-06 MED ORDER — DIPHENHYDRAMINE HCL 25 MG PO CAPS
ORAL_CAPSULE | ORAL | Status: AC
  Filled 2019-04-06: qty 1

## 2019-04-06 MED ORDER — TRASTUZUMAB-DKST CHEMO 150 MG IV SOLR
600.0000 mg | Freq: Once | INTRAVENOUS | Status: AC
  Administered 2019-04-06: 10:00:00 600 mg via INTRAVENOUS
  Filled 2019-04-06: qty 28.57

## 2019-04-06 MED ORDER — SODIUM CHLORIDE 0.9% FLUSH
10.0000 mL | INTRAVENOUS | Status: DC | PRN
  Administered 2019-04-06: 10 mL
  Filled 2019-04-06: qty 10

## 2019-04-06 MED ORDER — SODIUM CHLORIDE 0.9 % IV SOLN
Freq: Once | INTRAVENOUS | Status: AC
  Administered 2019-04-06: 09:00:00 via INTRAVENOUS
  Filled 2019-04-06: qty 250

## 2019-04-06 MED ORDER — HEPARIN SOD (PORK) LOCK FLUSH 100 UNIT/ML IV SOLN
500.0000 [IU] | Freq: Once | INTRAVENOUS | Status: AC | PRN
  Administered 2019-04-06: 11:00:00 500 [IU]
  Filled 2019-04-06: qty 5

## 2019-04-06 MED ORDER — GABAPENTIN 100 MG PO CAPS: ORAL_CAPSULE | ORAL | 1 refills | Status: DC

## 2019-04-06 MED ORDER — ACETAMINOPHEN 325 MG PO TABS
ORAL_TABLET | ORAL | Status: AC
  Filled 2019-04-06: qty 2

## 2019-04-06 MED ORDER — ACETAMINOPHEN 325 MG PO TABS
650.0000 mg | ORAL_TABLET | Freq: Once | ORAL | Status: AC
  Administered 2019-04-06: 650 mg via ORAL

## 2019-04-06 MED ORDER — DIPHENHYDRAMINE HCL 25 MG PO CAPS
25.0000 mg | ORAL_CAPSULE | Freq: Once | ORAL | Status: AC
  Administered 2019-04-06: 25 mg via ORAL

## 2019-04-06 MED FILL — GABAPENTIN 100 MG CAPSULE: 100 | 30 days supply | Qty: 150 | Fill #0

## 2019-04-06 NOTE — Patient Instructions (Signed)
Trastuzumab injection for infusion What is this medicine? TRASTUZUMAB (tras TOO zoo mab) is a monoclonal antibody. It is used to treat breast cancer and stomach cancer. This medicine may be used for other purposes; ask your health care provider or pharmacist if you have questions. COMMON BRAND NAME(S): Herceptin, Herzuma, KANJINTI, Ogivri, Ontruzant, Trazimera What should I tell my health care provider before I take this medicine? They need to know if you have any of these conditions:  heart disease  heart failure  lung or breathing disease, like asthma  an unusual or allergic reaction to trastuzumab, benzyl alcohol, or other medications, foods, dyes, or preservatives  pregnant or trying to get pregnant  breast-feeding How should I use this medicine? This drug is given as an infusion into a vein. It is administered in a hospital or clinic by a specially trained health care professional. Talk to your pediatrician regarding the use of this medicine in children. This medicine is not approved for use in children. Overdosage: If you think you have taken too much of this medicine contact a poison control center or emergency room at once. NOTE: This medicine is only for you. Do not share this medicine with others. What if I miss a dose? It is important not to miss a dose. Call your doctor or health care professional if you are unable to keep an appointment. What may interact with this medicine? This medicine may interact with the following medications:  certain types of chemotherapy, such as daunorubicin, doxorubicin, epirubicin, and idarubicin This list may not describe all possible interactions. Give your health care provider a list of all the medicines, herbs, non-prescription drugs, or dietary supplements you use. Also tell them if you smoke, drink alcohol, or use illegal drugs. Some items may interact with your medicine. What should I watch for while using this medicine? Visit your  doctor for checks on your progress. Report any side effects. Continue your course of treatment even though you feel ill unless your doctor tells you to stop. Call your doctor or health care professional for advice if you get a fever, chills or sore throat, or other symptoms of a cold or flu. Do not treat yourself. Try to avoid being around people who are sick. You may experience fever, chills and shaking during your first infusion. These effects are usually mild and can be treated with other medicines. Report any side effects during the infusion to your health care professional. Fever and chills usually do not happen with later infusions. Do not become pregnant while taking this medicine or for 7 months after stopping it. Women should inform their doctor if they wish to become pregnant or think they might be pregnant. Women of child-bearing potential will need to have a negative pregnancy test before starting this medicine. There is a potential for serious side effects to an unborn child. Talk to your health care professional or pharmacist for more information. Do not breast-feed an infant while taking this medicine or for 7 months after stopping it. Women must use effective birth control with this medicine. What side effects may I notice from receiving this medicine? Side effects that you should report to your doctor or health care professional as soon as possible:  allergic reactions like skin rash, itching or hives, swelling of the face, lips, or tongue  chest pain or palpitations  cough  dizziness  feeling faint or lightheaded, falls  fever  general ill feeling or flu-like symptoms  signs of worsening heart failure like   breathing problems; swelling in your legs and feet  unusually weak or tired Side effects that usually do not require medical attention (report to your doctor or health care professional if they continue or are bothersome):  bone pain  changes in  taste  diarrhea  joint pain  nausea/vomiting  weight loss This list may not describe all possible side effects. Call your doctor for medical advice about side effects. You may report side effects to FDA at 1-800-FDA-1088. Where should I keep my medicine? This drug is given in a hospital or clinic and will not be stored at home. NOTE: This sheet is a summary. It may not cover all possible information. If you have questions about this medicine, talk to your doctor, pharmacist, or health care provider.  2020 Elsevier/Gold Standard (2016-07-23 14:37:52)  

## 2019-04-08 ENCOUNTER — Ambulatory Visit
Admission: RE | Admit: 2019-04-08 | Discharge: 2019-04-08 | Disposition: A | Discharge status: Home / Self Care | Payer: 59 | Source: Ambulatory Visit | Attending: Surgery | Admitting: Surgery

## 2019-04-08 ENCOUNTER — Ambulatory Visit (HOSPITAL_COMMUNITY)
Admission: RE | Admit: 2019-04-08 | Discharge: 2019-04-08 | Disposition: A | Discharge status: Home / Self Care | Payer: 59 | Source: Ambulatory Visit | Attending: Surgery | Admitting: Surgery

## 2019-04-08 ENCOUNTER — Other Ambulatory Visit: Payer: Self-pay | Admitting: Surgery

## 2019-04-08 ENCOUNTER — Encounter (HOSPITAL_BASED_OUTPATIENT_CLINIC_OR_DEPARTMENT_OTHER)
Admission: RE | Disposition: A | Discharge status: Home / Self Care | Payer: Self-pay | Source: Home / Self Care | Attending: Surgery

## 2019-04-08 ENCOUNTER — Ambulatory Visit (HOSPITAL_BASED_OUTPATIENT_CLINIC_OR_DEPARTMENT_OTHER)
Admission: RE | Admit: 2019-04-08 | Discharge: 2019-04-08 | Disposition: A | Discharge status: Home / Self Care | Payer: 59 | Attending: Surgery | Admitting: Surgery

## 2019-04-08 ENCOUNTER — Ambulatory Visit (HOSPITAL_BASED_OUTPATIENT_CLINIC_OR_DEPARTMENT_OTHER): Payer: 59 | Admitting: Certified Registered"

## 2019-04-08 ENCOUNTER — Encounter (HOSPITAL_BASED_OUTPATIENT_CLINIC_OR_DEPARTMENT_OTHER): Payer: Self-pay | Admitting: Anesthesiology

## 2019-04-08 ENCOUNTER — Other Ambulatory Visit: Payer: Self-pay

## 2019-04-08 DIAGNOSIS — C50912 Malignant neoplasm of unspecified site of left female breast: Secondary | ICD-10-CM

## 2019-04-08 DIAGNOSIS — Z791 Long term (current) use of non-steroidal anti-inflammatories (NSAID): Secondary | ICD-10-CM | POA: Diagnosis not present

## 2019-04-08 DIAGNOSIS — D0512 Intraductal carcinoma in situ of left breast: Secondary | ICD-10-CM | POA: Diagnosis not present

## 2019-04-08 DIAGNOSIS — Z9221 Personal history of antineoplastic chemotherapy: Secondary | ICD-10-CM | POA: Diagnosis not present

## 2019-04-08 DIAGNOSIS — N6042 Mammary duct ectasia of left breast: Secondary | ICD-10-CM | POA: Diagnosis not present

## 2019-04-08 DIAGNOSIS — Z79899 Other long term (current) drug therapy: Secondary | ICD-10-CM | POA: Diagnosis not present

## 2019-04-08 DIAGNOSIS — G8918 Other acute postprocedural pain: Secondary | ICD-10-CM | POA: Diagnosis not present

## 2019-04-08 DIAGNOSIS — Z170 Estrogen receptor positive status [ER+]: Secondary | ICD-10-CM | POA: Insufficient documentation

## 2019-04-08 DIAGNOSIS — K219 Gastro-esophageal reflux disease without esophagitis: Secondary | ICD-10-CM | POA: Diagnosis not present

## 2019-04-08 DIAGNOSIS — D242 Benign neoplasm of left breast: Secondary | ICD-10-CM | POA: Insufficient documentation

## 2019-04-08 DIAGNOSIS — C50412 Malignant neoplasm of upper-outer quadrant of left female breast: Secondary | ICD-10-CM | POA: Insufficient documentation

## 2019-04-08 HISTORY — DX: Adverse effect of antineoplastic and immunosuppressive drugs, initial encounter: T45.1X5A

## 2019-04-08 HISTORY — DX: Adverse effect of antineoplastic and immunosuppressive drugs, initial encounter: G62.0

## 2019-04-08 HISTORY — PX: BREAST LUMPECTOMY WITH RADIOACTIVE SEED AND SENTINEL LYMPH NODE BIOPSY: SHX6550

## 2019-04-08 HISTORY — PX: BREAST LUMPECTOMY: SHX2

## 2019-04-08 HISTORY — PX: BREAST EXCISIONAL BIOPSY: SUR124

## 2019-04-08 SURGERY — BREAST LUMPECTOMY WITH RADIOACTIVE SEED AND SENTINEL LYMPH NODE BIOPSY
Anesthesia: Regional | Site: Breast | Laterality: Left

## 2019-04-08 MED ORDER — CEFAZOLIN SODIUM-DEXTROSE 2-4 GM/100ML-% IV SOLN
2.0000 g | INTRAVENOUS | Status: AC
  Administered 2019-04-08: 2 g via INTRAVENOUS

## 2019-04-08 MED ORDER — FENTANYL CITRATE (PF) 100 MCG/2ML IJ SOLN
INTRAMUSCULAR | Status: AC
  Filled 2019-04-08: qty 2

## 2019-04-08 MED ORDER — ACETAMINOPHEN 500 MG PO TABS
1000.0000 mg | ORAL_TABLET | ORAL | Status: AC
  Administered 2019-04-08: 1000 mg via ORAL

## 2019-04-08 MED ORDER — LIDOCAINE HCL (CARDIAC) PF 100 MG/5ML IV SOSY
PREFILLED_SYRINGE | INTRAVENOUS | Status: DC | PRN
  Administered 2019-04-08: 100 mg via INTRAVENOUS

## 2019-04-08 MED ORDER — FENTANYL CITRATE (PF) 100 MCG/2ML IJ SOLN
50.0000 ug | INTRAMUSCULAR | Status: AC | PRN
  Administered 2019-04-08 (×2): 50 ug via INTRAVENOUS
  Administered 2019-04-08: 25 ug via INTRAVENOUS
  Administered 2019-04-08 (×2): 50 ug via INTRAVENOUS
  Administered 2019-04-08: 25 ug via INTRAVENOUS

## 2019-04-08 MED ORDER — BUPIVACAINE-EPINEPHRINE (PF) 0.25% -1:200000 IJ SOLN
INTRAMUSCULAR | Status: DC | PRN
  Administered 2019-04-08: 14 mL

## 2019-04-08 MED ORDER — BUPIVACAINE LIPOSOME 1.3 % IJ SUSP
INTRAMUSCULAR | Status: DC | PRN
  Administered 2019-04-08: 10 mL via PERINEURAL

## 2019-04-08 MED ORDER — CHLORHEXIDINE GLUCONATE CLOTH 2 % EX PADS: 6.0000 | MEDICATED_PAD | Freq: Once | CUTANEOUS | Status: DC

## 2019-04-08 MED ORDER — GABAPENTIN 300 MG PO CAPS
300.0000 mg | ORAL_CAPSULE | ORAL | Status: AC
  Administered 2019-04-08: 300 mg via ORAL

## 2019-04-08 MED ORDER — LACTATED RINGERS IV SOLN
INTRAVENOUS | Status: DC
  Administered 2019-04-08 (×2): via INTRAVENOUS

## 2019-04-08 MED ORDER — CEFAZOLIN SODIUM-DEXTROSE 2-4 GM/100ML-% IV SOLN
INTRAVENOUS | Status: AC
  Filled 2019-04-08: qty 100

## 2019-04-08 MED ORDER — SODIUM CHLORIDE (PF) 0.9 % IJ SOLN
INTRAMUSCULAR | Status: AC
  Filled 2019-04-08: qty 10

## 2019-04-08 MED ORDER — OXYCODONE HCL 5 MG PO TABS: 5.0000 mg | ORAL_TABLET | Freq: Four times a day (QID) | ORAL | 0 refills | Status: DC | PRN

## 2019-04-08 MED ORDER — PROPOFOL 10 MG/ML IV BOLUS
INTRAVENOUS | Status: DC | PRN
  Administered 2019-04-08: 200 mg via INTRAVENOUS

## 2019-04-08 MED ORDER — MIDAZOLAM HCL 2 MG/2ML IJ SOLN
1.0000 mg | INTRAMUSCULAR | Status: DC | PRN
  Administered 2019-04-08: 2 mg via INTRAVENOUS

## 2019-04-08 MED ORDER — EPHEDRINE SULFATE 50 MG/ML IJ SOLN
INTRAMUSCULAR | Status: DC | PRN
  Administered 2019-04-08 (×2): 10 mg via INTRAVENOUS

## 2019-04-08 MED ORDER — ACETAMINOPHEN 500 MG PO TABS
ORAL_TABLET | ORAL | Status: AC
  Filled 2019-04-08: qty 2

## 2019-04-08 MED ORDER — SODIUM CHLORIDE (PF) 0.9 % IJ SOLN
INTRAVENOUS | Status: DC | PRN
  Administered 2019-04-08: 15:00:00 5 mL via INTRAMUSCULAR

## 2019-04-08 MED ORDER — SCOPOLAMINE 1 MG/3DAYS TD PT72: 1.0000 | MEDICATED_PATCH | Freq: Once | TRANSDERMAL | Status: DC

## 2019-04-08 MED ORDER — METHYLENE BLUE 0.5 % INJ SOLN
INTRAVENOUS | Status: AC
  Filled 2019-04-08: qty 10

## 2019-04-08 MED ORDER — FENTANYL CITRATE (PF) 100 MCG/2ML IJ SOLN
25.0000 ug | INTRAMUSCULAR | Status: DC | PRN
  Administered 2019-04-08 (×2): 50 ug via INTRAVENOUS

## 2019-04-08 MED ORDER — ONDANSETRON HCL 4 MG/2ML IJ SOLN
INTRAMUSCULAR | Status: DC | PRN
  Administered 2019-04-08: 4 mg via INTRAVENOUS

## 2019-04-08 MED ORDER — 0.9 % SODIUM CHLORIDE (POUR BTL) OPTIME
TOPICAL | Status: DC | PRN
  Administered 2019-04-08: 16:00:00 300 mL

## 2019-04-08 MED ORDER — DEXAMETHASONE SODIUM PHOSPHATE 4 MG/ML IJ SOLN
INTRAMUSCULAR | Status: DC | PRN
  Administered 2019-04-08: 10 mg via INTRAVENOUS

## 2019-04-08 MED ORDER — IBUPROFEN 800 MG PO TABS: 800.0000 mg | ORAL_TABLET | Freq: Three times a day (TID) | ORAL | 0 refills | Status: DC | PRN

## 2019-04-08 MED ORDER — BUPIVACAINE HCL (PF) 0.5 % IJ SOLN
INTRAMUSCULAR | Status: DC | PRN
  Administered 2019-04-08: 30 mL via PERINEURAL

## 2019-04-08 MED ORDER — GABAPENTIN 300 MG PO CAPS
ORAL_CAPSULE | ORAL | Status: AC
  Filled 2019-04-08: qty 1

## 2019-04-08 MED ORDER — TECHNETIUM TC 99M SULFUR COLLOID FILTERED
1.0000 | Freq: Once | INTRAVENOUS | Status: AC | PRN
  Administered 2019-04-08: 1 via INTRADERMAL

## 2019-04-08 MED ORDER — MIDAZOLAM HCL 2 MG/2ML IJ SOLN
INTRAMUSCULAR | Status: AC
  Filled 2019-04-08: qty 2

## 2019-04-08 SURGICAL SUPPLY — 52 items
APPLIER CLIP 9.375 MED OPEN (MISCELLANEOUS) ×3
BINDER BREAST LRG (GAUZE/BANDAGES/DRESSINGS) IMPLANT
BINDER BREAST MEDIUM (GAUZE/BANDAGES/DRESSINGS) IMPLANT
BINDER BREAST XLRG (GAUZE/BANDAGES/DRESSINGS) IMPLANT
BINDER BREAST XXLRG (GAUZE/BANDAGES/DRESSINGS) ×2 IMPLANT
BLADE SURG 15 STRL LF DISP TIS (BLADE) ×1 IMPLANT
BLADE SURG 15 STRL SS (BLADE) ×2
CANISTER SUC SOCK COL 7IN (MISCELLANEOUS) IMPLANT
CANISTER SUCT 1200ML W/VALVE (MISCELLANEOUS) ×3 IMPLANT
CHLORAPREP W/TINT 26 (MISCELLANEOUS) ×3 IMPLANT
CLIP APPLIE 9.375 MED OPEN (MISCELLANEOUS) ×1 IMPLANT
COVER BACK TABLE REUSABLE LG (DRAPES) ×3 IMPLANT
COVER MAYO STAND REUSABLE (DRAPES) ×3 IMPLANT
COVER PROBE W GEL 5X96 (DRAPES) ×3 IMPLANT
COVER WAND RF STERILE (DRAPES) IMPLANT
DECANTER SPIKE VIAL GLASS SM (MISCELLANEOUS) IMPLANT
DERMABOND ADVANCED (GAUZE/BANDAGES/DRESSINGS) ×2
DERMABOND ADVANCED .7 DNX12 (GAUZE/BANDAGES/DRESSINGS) ×1 IMPLANT
DRAPE LAPAROSCOPIC ABDOMINAL (DRAPES) ×3 IMPLANT
DRAPE UTILITY XL STRL (DRAPES) ×3 IMPLANT
ELECT COATED BLADE 2.86 ST (ELECTRODE) ×3 IMPLANT
ELECT REM PT RETURN 9FT ADLT (ELECTROSURGICAL) ×3
ELECTRODE REM PT RTRN 9FT ADLT (ELECTROSURGICAL) ×1 IMPLANT
GLOVE BIO SURGEON STRL SZ 6.5 (GLOVE) ×1 IMPLANT
GLOVE BIO SURGEONS STRL SZ 6.5 (GLOVE) ×1
GLOVE BIOGEL PI IND STRL 7.5 (GLOVE) IMPLANT
GLOVE BIOGEL PI IND STRL 8 (GLOVE) ×1 IMPLANT
GLOVE BIOGEL PI INDICATOR 7.5 (GLOVE) ×2
GLOVE BIOGEL PI INDICATOR 8 (GLOVE) ×2
GLOVE ECLIPSE 8.0 STRL XLNG CF (GLOVE) ×3 IMPLANT
GOWN STRL REUS W/ TWL LRG LVL3 (GOWN DISPOSABLE) ×2 IMPLANT
GOWN STRL REUS W/TWL LRG LVL3 (GOWN DISPOSABLE) ×4
HEMOSTAT ARISTA ABSORB 3G PWDR (HEMOSTASIS) IMPLANT
HEMOSTAT SNOW SURGICEL 2X4 (HEMOSTASIS) IMPLANT
KIT MARKER MARGIN INK (KITS) ×3 IMPLANT
NDL HYPO 25X1 1.5 SAFETY (NEEDLE) ×1 IMPLANT
NDL SAFETY ECLIPSE 18X1.5 (NEEDLE) IMPLANT
NEEDLE HYPO 18GX1.5 SHARP (NEEDLE)
NEEDLE HYPO 25X1 1.5 SAFETY (NEEDLE) ×3 IMPLANT
NS IRRIG 1000ML POUR BTL (IV SOLUTION) ×3 IMPLANT
PACK BASIN DAY SURGERY FS (CUSTOM PROCEDURE TRAY) ×3 IMPLANT
PENCIL BUTTON HOLSTER BLD 10FT (ELECTRODE) ×3 IMPLANT
SLEEVE SCD COMPRESS KNEE MED (MISCELLANEOUS) ×3 IMPLANT
SPONGE LAP 4X18 RFD (DISPOSABLE) ×3 IMPLANT
SUT MNCRL AB 4-0 PS2 18 (SUTURE) ×3 IMPLANT
SUT VICRYL 3-0 CR8 SH (SUTURE) ×5 IMPLANT
SYR CONTROL 10ML LL (SYRINGE) ×3 IMPLANT
TOWEL GREEN STERILE FF (TOWEL DISPOSABLE) ×3 IMPLANT
TRAY FAXITRON CT DISP (TRAY / TRAY PROCEDURE) ×3 IMPLANT
TUBE CONNECTING 20'X1/4 (TUBING) ×1
TUBE CONNECTING 20X1/4 (TUBING) ×2 IMPLANT
YANKAUER SUCT BULB TIP NO VENT (SUCTIONS) ×3 IMPLANT

## 2019-04-08 NOTE — Op Note (Signed)
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  She had good response and opted for breast conserving surgery.  There is also a papilloma that was biopsied that excision was recommended.The procedure has been discussed with the patient. Alternatives to surgery have been discussed with the patient.  Risks of surgery include bleeding,  Infection,  Seroma formation, death,  and the need for further surgery.   The patient understands and wishes to proceed.Sentinel lymph node mapping and dissection has been discussed with the patient.  Risk of bleeding,  Infection,  Seroma formation,  Additional procedures,,  Shoulder weakness ,  Shoulder stiffness,  Nerve and blood vessel injury and reaction to the mapping dyes have been discussed.  Alternatives to surgery have been discussed with the patient.  The patient agrees to proceed.   Description of procedure: The patient was met in holding area.  Questions were answered.  She underwent pectoral block for anesthesia and injection of technetium sulfur colloid by radiology.  Neoprobe used to identify seed location in the left breast x2.  Both reactive.  Questions were answered.  She was taken back the operative room and placed supine upon the OR table.  After induction of general esthesia, left breast was prepped draped sterile fashion timeout was performed  and proper patient, procedure verified.  She received appropriate preoperative antibiotics.  Films were available for review.  The left breast medial lesion was addressed first.  Curvilinear incision was made around the medial border of the left nipple areolar complex.  Dissection was carried out to help the neoprobe and all tissue around the seed clip were excised with a grossly negative margin.  Hemostasis was achieved wound was closed in layers with 3-0 Vicryl for Monocryl.  A left upper outer quadrant breast incision was made near the axilla.  Dissection was carried down all tissue around the seed and clip were excised with a grossly negative margin.  Hemostasis achieved with cautery.  Faxitron of both images showed the seed and clip to be present.  Margins were grossly negative.  I took an additional deep margin from the lumpectomy in the axilla.  The neoprobe was then switched to technetium.  Dissection was carried down into the deep left axillary contents.  Of note 4 cc of methylene blue dye were injected under the nipple prior to incision.  5 minutes were allowed for perfusion of the dye.  Identified 3 hot blue level 1 sentinel nodes and these were all removed.  Background counts approached 0.  The long thoracic nerve, axillary vein and thoracodorsal trunk were all preserved.  Irrigation used and suctioned out.  Hemostasis achieved.  Incision closed with a combination of 3-0 Vicryl and 4-0 Monocryl.  Dermabond placed on both incisions.  All counts were found to be correct.  Breast binder placed.  The patient was awoke extubated taken to recovery in satisfactory condition.

## 2019-04-08 NOTE — Anesthesia Procedure Notes (Signed)
Anesthesia Regional Block: Pectoralis block   Pre-Anesthetic Checklist: ,, timeout performed, Correct Patient, Correct Site, Correct Laterality, Correct Procedure, Correct Position, site marked, Risks and benefits discussed,  Surgical consent,  Pre-op evaluation,  At surgeon's request and post-op pain management  Laterality: Left  Prep: Maximum Sterile Barrier Precautions used, chloraprep       Needles:  Injection technique: Single-shot  Needle Type: Echogenic Stimulator Needle     Needle Length: 9cm  Needle Gauge: 22     Additional Needles:   Procedures:,,,, ultrasound used (permanent image in chart),,,,  Narrative:  Start time: 04/08/2019 2:14 PM End time: 04/08/2019 2:24 PM Injection made incrementally with aspirations every 5 mL.  Performed by: Personally  Anesthesiologist: Freddrick March, MD  Additional Notes: Monitors applied. No increased pain on injection. No increased resistance to injection. Injection made in 5cc increments. Good needle visualization. Patient tolerated procedure well.

## 2019-04-08 NOTE — H&P (Signed)
Natasha Huffman Documented: 03/08/2019 11:12 AM Location: [facility] Patient #: [id number] DOB: 1960/09/23 Married / Language: English / Race: Black or African American Female  History of Present Illness Natasha Moores A. Cornett MD; 03/08/2019 2:00 PM) Patient words: Patient returns for follow-up after completing neoadjuvant chemotherapy for stage II left breast cancer. Magnetic resonance imaging shows significant reduction in size. She has no complaints and is ready to proceed with breast conserving surgery after discussion of options.               ADDITIONAL INFORMATION: PROGNOSTIC INDICATORS Results: IMMUNOHISTOCHEMICAL AND MORPHOMETRIC ANALYSIS PERFORMED MANUALLY The tumor cells are POSITIVE for Her2 (3+). Of note, the tumor cells are heterogenous in regards to Her2 expression. Estrogen Receptor: 100%, POSITIVE, STRONG STAINING INTENSITY Progesterone Receptor: 2%, POSITIVE, STRONG STAINING INTENSITY Proliferation Marker Ki67: 40% REFERENCE RANGE ESTROGEN RECEPTOR NEGATIVE 0% POSITIVE =>1% REFERENCE RANGE PROGESTERONE RECEPTOR NEGATIVE 0% POSITIVE =>1% All controls stained appropriately Natasha Huffman, Electronic Signature ( Signed 09/25/2018) FINAL DIAGNOSIS Diagnosis Breast, left, needle core biopsy, 2 o'clock; ribbon clip - INVASIVE DUCTAL CARCINOMA. - SEE COMMENT. 1 of 2 FINAL for Natasha Huffman ([id number]) Microscopic Comment The carcinoma appears grade III. A breast prognostic profile will be performed and the results reported separately. The results were called to the [facility] on 09/24/2018. (JBK:kh 09/24/2018) Natasha Huffman, Electronic Signature (Case signed 09/24/2018)       Diagnosis Breast, left, needle core biopsy, medial - DUCTAL PAPILLOMA. Microscopic Comment The results were reported to [facility] on 10/13/2018.         CLINICAL  DATA: 58 year old patient has undergone neoadjuvant chemotherapy for biopsy-proven left breast cancer in the upper outer quadrant. On the breast MRI October 01, 2018, prior to chemotherapy, the biopsied malignancy measured 1.9 x 1.6 x 1.2 cm, with several adjacent small satellite lesions, with an anterior to posterior dimension of 4.2 cm, inclusive of the satellite lesions.  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  LABS: None obtained.  EXAM: BILATERAL BREAST MRI WITH AND WITHOUT CONTRAST  TECHNIQUE: Multiplanar, multisequence MR images of both breasts were obtained prior to and following the intravenous administration of 10 ml of Gadavist  Three-dimensional MR images were rendered by post-processing of the original MR data on an independent workstation. The three-dimensional MR images were interpreted, and findings are reported in the following complete MRI report for this study. Three dimensional images were evaluated at the independent DynaCad workstation  COMPARISON: Bilateral breast MRI October 01, 2018, MRI guided core needle left breast biopsy October 12, 2018 diagnostic left mammogram and left breast ultrasound February 07/2019, and screening mammogram September 23, 2018.  FINDINGS: Breast composition: b. Scattered fibroglandular tissue.  Background parenchymal enhancement: Mild  Right breast: No mass or abnormal enhancement.  Left breast: The known malignancy in the posterior third of the upper-outer quadrant of the left breast has decreased in size. It continues to enhance and measures 1.2 x 0.9 cm x 1.0 cm (AP by transverse by craniocaudal). Central biopsy clip artifact is identified within the mass. No adjacent enhancing satellite nodules are seen. The biopsy-proven papilloma in the left breast, posterior to the nipple,  is less apparent on today's breast MRI, possibly due to interval decrease in size status post MRI-guided biopsy of the mass. Stable benign intramammary lymph node in the far posterior  outer left breast near the level of the nipple. Remainder of the left breast is negative.  Lymph nodes: No abnormal appearing lymph nodes.  Ancillary findings: Port-A-Cath present in the superior right chest wall, partially imaged.  IMPRESSION: The biopsy-proven upper-outer quadrant left breast carcinoma has decreased in size and satellite nodularity has resolved. The biopsied malignancy currently measures 1.2 x 0.9 x 1.0 cm. Previously on the MRI of 10/01/2018, the biopsied mass measured 1.9 x 1.6 x 1.2 cm.  The biopsy-proven papilloma in the left breast is less apparent on today's MRI, likely due to interval biopsy.  Negative for lymphadenopathy.  No MRI evidence of malignancy in the right breast.  RECOMMENDATION: Continue treatment planning.  BI-RADS CATEGORY 6: Known biopsy-proven malignancy.   Electronically Signed By: Natasha Huffman M.D. On: 02/08/2019 14:13.  The patient is a 58 year old female.   Past Surgical History Natasha Huffman, CMA; 03/08/2019 11:12 AM) Breast Biopsy Left. Cesarean Section - 1 Colon Polyp Removal - Colonoscopy  Diagnostic Studies History Natasha Huffman, CMA; 03/08/2019 11:12 AM) Colonoscopy 1-5 years ago Mammogram within last year Pap Smear 1-5 years ago  Allergies Natasha Huffman, CMA; 03/08/2019 11:12 AM) No Known Allergies [09/28/2018]: No Known Drug Allergies [03/07/2015]: Allergies Reconciled  Medication History (Natasha Huffman, CMA; 03/08/2019 11:13 AM) NexIUM (20MG Capsule DR, Oral) Active. Advil (200MG Capsule, Oral) Active. Ambien (10MG Tablet, Oral) Active. Colace (100MG Capsule, Oral) Active. Ibuprofen (200MG Tablet, Oral) Active. Prochlorperazine Maleate (10MG Tablet, Oral) Active. Ciprofloxacin HCl (500MG  Tablet, Oral) Active. LORazepam (0.5MG Tablet, Oral) Active. Medications Reconciled  Social History Natasha Huffman, CMA; 03/08/2019 11:12 AM) Caffeine use Carbonated beverages, Coffee. No alcohol use Tobacco use Never smoker.  Family History Natasha Huffman, [place]; 03/08/2019 11:12 AM) Alcohol Abuse Father. Cerebrovascular Accident Family Members In [place] Members In General. Diabetes Mellitus Family Members In General.  Pregnancy / Birth History Natasha Huffman, [place]; 03/08/2019 11:12 AM) Age at menarche 14 years. Age of menopause <45 5-50 Gravida 2 Irregular periods Maternal age 49-25 Para 2  Other Problems Natasha Huffman, CMA; 03/08/2019 11:12 AM) Breast Cancer Cholelithiasis Hemorrhoids     Review of Systems (Natasha Huffman CMA; 03/08/2019 11:12 AM) General Not Present- Appetite Loss, Chills, Fatigue, Fever, Night Sweats, Weight Gain and Weight Loss. Skin Not Present- Change in Wart/Mole, Dryness, Hives, Jaundice, New Lesions, Non-Healing Wounds, Rash and Ulcer. Respiratory Not Present- Bloody sputum, Chronic Cough, Difficulty Breathing, Snoring and Wheezing. Breast Not Present- Breast Mass, Breast Pain, Nipple Discharge and Skin Changes. Cardiovascular Not Present- Chest Pain, Difficulty Breathing Lying Down, Leg Cramps, Palpitations, Rapid Heart Rate, Shortness of Breath and Swelling of Extremities. Gastrointestinal Not Present- Abdominal Pain, Bloating, Bloody Stool, Change in Bowel Habits, Chronic diarrhea, Constipation, Difficulty Swallowing, Excessive gas, Gets full quickly at meals, Hemorrhoids, Indigestion, Nausea, Rectal Pain and Vomiting. Female Genitourinary Not Present- Frequency, Nocturia, Painful Urination, Pelvic Pain and Urgency. Musculoskeletal Not Present- Back Pain, Joint Pain, Joint Stiffness, Muscle Pain, Muscle Weakness and Swelling of Extremities. Neurological Not Present- Decreased Memory, Fainting, Headaches,  Numbness, Seizures, Tingling, Tremor, Trouble walking and Weakness. Psychiatric Not Present- Anxiety, Bipolar, Change in Sleep Pattern, Depression, Fearful and Frequent crying. Endocrine Not Present- Cold Intolerance, Excessive Hunger, Hair Changes, Heat Intolerance, Hot flashes and New Diabetes. Hematology Not Present- Blood Thinners, Easy Bruising, Excessive bleeding, Gland problems, HIV and Persistent Infections.  Vitals (Natasha Huffman CMA; 03/08/2019 11:14 AM) 03/08/2019 11:13 AM Weight: 200.25 lb Height: 62in Body Surface Area: 1.91 m Body Mass Index: 36.63 kg/m  Temp.:  97.39F(Oral)  Pulse: 94 (Regular)  BP: 142/62 (Sitting, Left Arm, Standard)        Physical Exam (Thomas A. Cornett MD; 03/08/2019 2:00 PM)  General Mental Status-Alert. General Appearance-Consistent with stated age. Hydration-Well hydrated. Voice-Normal.  Head and Neck Head-normocephalic, atraumatic with no lesions or palpable masses. Trachea-midline. Thyroid Gland Characteristics - normal size and consistency.  Chest and Lung Exam Chest and lung exam reveals -quiet, even and easy respiratory effort with no use of accessory muscles and on auscultation, normal breath sounds, no adventitious sounds and normal vocal resonance. Inspection Chest Wall - Normal. Back - normal.  Breast Breast - Left-Symmetric, Non Tender, No Biopsy scars, no Dimpling - Left, No Inflammation, No Lumpectomy scars, No Mastectomy scars, No Peau d' Orange. Breast - Right-Symmetric, Non Tender, No Biopsy scars, no Dimpling - Right, No Inflammation, No Lumpectomy scars, No Mastectomy scars, No Peau d' Orange. Breast Lump-No Palpable Breast Mass.  Lymphatic Head & Neck  General Head & Neck Lymphatics: Bilateral - Description - Normal. Axillary  General Axillary Region: Bilateral - Description - Normal. Tenderness - Non Tender.    Assessment & Plan (Thomas A. Cornett MD;  03/08/2019 2:00 PM)  BREAST CANCER, LEFT (C50.912) Impression: Patient opted for left breast lumpectomy which we bracketed as well as removal of the papilloma left breast. She will undergo sentinel lymph node mapping as well. Risk of lumpectomy include bleeding, infection, seroma, more surgery, use of seed/wire, wound care, cosmetic deformity and the need for other treatments, death , blood clots, death. Pt agrees to proceed. Risk of sentinel lymph node mapping include bleeding, infection, lymphedema, shoulder pain. stiffness, dye allergy. cosmetic deformity , blood clots, death, need for more surgery. Pt agres to proceed.  Current Plans You are being scheduled for surgery- Our schedulers will call you.  You should hear from our office's scheduling department within 5 working days about the location, date, and time of surgery. We try to make accommodations for patient's preferences in scheduling surgery, but sometimes the OR schedule or the surgeon's schedule prevents [place] from making those accommodations.  If you have not heard from our office [phone] in 5 working days, call the office and ask for your surgeon's nurse.  If you have other questions about your diagnosis, plan, or surgery, call the office and ask for your surgeon's nurse.  Pt Education - [facility] Breast Biopsy HCI: discussed with patient and provided information. Pt Education - [facility] breast cancer surgery: discussed with patient and provided information. We discussed the staging and pathophysiology of breast cancer. We discussed all of the different options for treatment for breast cancer including surgery, chemotherapy, radiation therapy, Herceptin, and antiestrogen therapy. We discussed a sentinel lymph node biopsy as she does not appear to having lymph node involvement right now. We discussed the performance of that with injection of radioactive tracer and blue dye. We discussed that she would have an incision underneath her  axillary hairline. We discussed that there is a bout a 10-20% chance of having a positive node with a sentinel lymph node biopsy and we will await the permanent pathology to make any other first further decisions in terms of her treatment. One of these options might be to return to the operating room to perform an axillary lymph node dissection. We discussed about a 1-2% risk lifetime of chronic shoulder pain as well as lymphedema associated with a sentinel lymph node biopsy. We discussed the options for treatment of the breast cancer which included lumpectomy versus a  mastectomy. We discussed the performance of the lumpectomy with a wire placement. We discussed a 10-20% chance of a positive margin requiring reexcision in the operating room. We also discussed that she may need radiation therapy or antiestrogen therapy or both if she undergoes lumpectomy. We discussed the mastectomy and the postoperative care for that as well. We discussed that there is no difference in her survival whether she undergoes lumpectomy with radiation therapy or antiestrogen therapy versus a mastectomy. There is a slight difference in the local recurrence rate being 3-5% with lumpectomy and about 1% with a mastectomy. We discussed the risks of operation including bleeding, infection, possible reoperation. She understands her further therapy will be based on what her stages at the time of her operation.

## 2019-04-08 NOTE — Transfer of Care (Signed)
Immediate Anesthesia Transfer of Care Note  Patient: Natasha Huffman  Procedure(s) Performed: LEFT BREAST RADIOACTIVE SEED LUMPECTOMY X2 AND LEFT SENTINEL LYMPH NODE MAPPING (Left Breast)  Patient Location: PACU  Anesthesia Type:GA combined with regional for post-op pain  Level of Consciousness: awake, alert  and oriented  Airway & Oxygen Therapy: Patient Spontanous Breathing and Patient connected to nasal cannula oxygen  Post-op Assessment: Report given to RN and Post -op Vital signs reviewed and stable  Post vital signs: Reviewed and stable  Last Vitals:  Vitals Value Taken Time  BP    Temp    Pulse 87 04/08/19 1609  Resp 0 04/08/19 1609  SpO2 100 % 04/08/19 1609  Vitals shown include unvalidated device data.  Last Pain:  Vitals:   04/08/19 1354  TempSrc: Oral      Patients Stated Pain Goal: 6 ([id number])  Complications: No apparent anesthesia complications

## 2019-04-08 NOTE — Progress Notes (Signed)
nuc med injections, emotional support provided.

## 2019-04-08 NOTE — Anesthesia Procedure Notes (Signed)
Procedure Name: LMA Insertion Date/Time: 04/08/2019 2:52 PM Performed by: Maryella Shivers, CRNA Pre-anesthesia Checklist: Patient identified, Emergency Drugs available, Suction available and Patient being monitored Patient Re-evaluated:Patient Re-evaluated prior to induction Oxygen Delivery Method: Circle system utilized Preoxygenation: Pre-oxygenation with 100% oxygen Induction Type: IV induction Ventilation: Mask ventilation without difficulty LMA: LMA inserted LMA Size: 4.0 Number of attempts: 1 Airway Equipment and Method: Bite block Placement Confirmation: positive ETCO2 Tube secured with: Tape Dental Injury: Teeth and Oropharynx as per pre-operative assessment

## 2019-04-08 NOTE — Progress Notes (Signed)
   Assisted Dr. Woodrum with left, ultrasound guided, pectoralis block. Side rails up, monitors on throughout procedure. See vital signs in flow sheet. Tolerated Procedure well. 

## 2019-04-08 NOTE — Anesthesia Preprocedure Evaluation (Addendum)
Anesthesia Evaluation  Patient identified by MRN, date of birth, ID band Patient awake    Reviewed: Allergy & Precautions, NPO status , Patient's Chart, lab work & pertinent test results  Airway Mallampati: II  TM Distance: >3 FB Neck ROM: Full    Dental no notable dental hx. (+) Teeth Intact, Dental Advisory Given   Pulmonary neg pulmonary ROS,    Pulmonary exam normal breath sounds clear to auscultation       Cardiovascular negative cardio ROS Normal cardiovascular exam Rhythm:Regular Rate:Normal  TTE 2020 unremarkable   Neuro/Psych negative neurological ROS  negative psych ROS   GI/Hepatic Neg liver ROS, GERD  Controlled,  Endo/Other  negative endocrine ROS  Renal/GU negative Renal ROS  negative genitourinary   Musculoskeletal negative musculoskeletal ROS (+)   Abdominal   Peds  Hematology negative hematology ROS (+)   Anesthesia Other Findings Left breast CA  Reproductive/Obstetrics                            Anesthesia Physical Anesthesia Plan  ASA: II  Anesthesia Plan: General and Regional   Post-op Pain Management:  Regional for Post-op pain   Induction: Intravenous  PONV Risk Score and Plan: 3 and Ondansetron, Dexamethasone and Midazolam  Airway Management Planned: LMA  Additional Equipment:   Intra-op Plan:   Post-operative Plan: Extubation in OR  Informed Consent: I have reviewed the patients History and Physical, chart, labs and discussed the procedure including the risks, benefits and alternatives for the proposed anesthesia with the patient or authorized representative who has indicated his/her understanding and acceptance.     Dental advisory given  Plan Discussed with: CRNA  Anesthesia Plan Comments:         Anesthesia Quick Evaluation

## 2019-04-08 NOTE — Discharge Instructions (Signed)
[facility] °Office Phone Number [phone] ° °BREAST BIOPSY/ PARTIAL MASTECTOMY: POST OP INSTRUCTIONS ° °Always review your discharge instruction sheet given to you by the facility where your surgery was performed. ° °IF YOU HAVE DISABILITY OR FAMILY LEAVE FORMS, YOU MUST BRING THEM TO THE OFFICE FOR PROCESSING.  DO NOT GIVE THEM TO YOUR DOCTOR. ° °1. A prescription for pain medication may be given to you upon discharge.  Take your pain medication as prescribed, if needed.  If narcotic pain medicine is not needed, then you may take acetaminophen (Tylenol) or ibuprofen (Advil) as needed. °2. Take your usually prescribed medications unless otherwise directed °3. If you need a refill on your pain medication, please contact your pharmacy.  They will contact our office to request authorization.  Prescriptions will not be filled after 5pm or on week-ends. °4. You should eat very light the first 24 hours after surgery, such as soup, crackers, pudding, etc.  Resume your normal diet the day after surgery. °5. Most patients will experience some swelling and bruising in the breast.  Ice packs and a good support bra will help.  Swelling and bruising can take several days to resolve.  °6. It is common to experience some constipation if taking pain medication after surgery.  Increasing fluid intake and taking a stool softener will usually help or prevent this problem from occurring.  A mild laxative (Milk of Magnesia or Miralax) should be taken according to package directions if there are no bowel movements after 48 hours. °7. Unless discharge instructions indicate otherwise, you may remove your bandages 24-48 hours after surgery, and you may shower at that time.  You may have steri-strips (small skin tapes) in place directly over the incision.  These strips should be left on the skin for 7-10 days.  If your surgeon used skin glue on the incision, you may shower in 24 hours.  The glue will flake off over the  next 2-3 weeks.  Any sutures or staples will be removed at the office during your follow-up visit. °8. ACTIVITIES:  You may resume regular daily activities (gradually increasing) beginning the next day.  Wearing a good support bra or sports bra minimizes pain and swelling.  You may have sexual intercourse when it is comfortable. °a. You may drive when you no longer are taking prescription pain medication, you can comfortably wear a seatbelt, and you can safely maneuver your car and apply brakes. °b. RETURN TO WORK:  ______________________________________________________________________________________ °9. You should see your doctor in the office for a follow-up appointment approximately two weeks after your surgery.  Your doctor’s nurse will typically make your follow-up appointment when she calls you with your pathology report.  Expect your pathology report 2-3 business days after your surgery.  You may call to check if you do not hear from us after three days. °10. OTHER INSTRUCTIONS: _______________________________________________________________________________________________ _____________________________________________________________________________________________________________________________________ °_____________________________________________________________________________________________________________________________________ °_____________________________________________________________________________________________________________________________________ ° °WHEN TO CALL YOUR DOCTOR: °1. Fever over 101.0 °2. Nausea and/or vomiting. °3. Extreme swelling or bruising. °4. Continued bleeding from incision. °5. Increased pain, redness, or drainage from the incision. ° °The clinic staff is available to answer your questions during regular business hours.  Please don’t hesitate to call and ask to speak to one of the nurses for clinical concerns.  If you have a medical emergency, go to the nearest  emergency room or call 911.  A surgeon from [facility] is always on call at the hospital. ° °For further questions, please visit [email or web address]  ° ° °  No Tylenol until 8pm!   Post Anesthesia Home Care Instructions  Activity: Get plenty of rest for the remainder of the day. A responsible individual must stay with you for 24 hours following the procedure.  For the next 24 hours, DO NOT: -Drive a car -Paediatric nurse -Drink alcoholic beverages -Take any medication unless instructed by your physician -Make any legal decisions or sign important papers.  Meals: Start with liquid foods such as gelatin or soup. Progress to regular foods as tolerated. Avoid greasy, spicy, heavy foods. If nausea and/or vomiting occur, drink only clear liquids until the nausea and/or vomiting subsides. Call your physician if vomiting continues.  Special Instructions/Symptoms: Your throat may feel dry or sore from the anesthesia or the breathing tube placed in your throat during surgery. If this causes discomfort, gargle with warm salt water. The discomfort should disappear within 24 hours.  If you had a scopolamine patch placed behind your ear for the management of post- operative nausea and/or vomiting:  1. The medication in the patch is effective for 72 hours, after which it should be removed.  Wrap patch in a tissue and discard in the trash. Wash hands thoroughly with soap and water. 2. You may remove the patch earlier than 72 hours if you experience unpleasant side effects which may include dry mouth, dizziness or visual disturbances. 3. Avoid touching the patch. Wash your hands with soap and water after contact with the patch.   Information for Discharge Teaching: EXPAREL (bupivacaine liposome injectable suspension)   Your surgeon or anesthesiologist gave you EXPAREL(bupivacaine) to help control your pain after surgery.   EXPAREL is a local anesthetic that provides pain relief by  numbing the tissue around the surgical site.  EXPAREL is designed to release pain medication over time and can control pain for up to 72 hours.  Depending on how you respond to EXPAREL, you may require less pain medication during your recovery.  Possible side effects:  Temporary loss of sensation or ability to move in the area where bupivacaine was injected.  Nausea, vomiting, constipation  Rarely, numbness and tingling in your mouth or lips, lightheadedness, or anxiety may occur.  Call your doctor right away if you think you may be experiencing any of these sensations, or if you have other questions regarding possible side effects.  Follow all other discharge instructions given to you by your surgeon or nurse. Eat a healthy diet and drink plenty of water or other fluids.  If you return to the hospital for any reason within 96 hours following the administration of EXPAREL, it is important for health care providers to know that you have received this anesthetic. A teal colored band has been placed on your arm with the date, time and amount of EXPAREL you have received in order to alert and inform your health care providers. Please leave this armband in place for the full 96 hours following administration, and then you may remove the band.

## 2019-04-09 ENCOUNTER — Encounter (HOSPITAL_BASED_OUTPATIENT_CLINIC_OR_DEPARTMENT_OTHER): Payer: Self-pay | Admitting: Surgery

## 2019-04-09 NOTE — Anesthesia Postprocedure Evaluation (Signed)
Anesthesia Post Note  Patient: Natasha Huffman  Procedure(s) Performed: LEFT BREAST RADIOACTIVE SEED LUMPECTOMY X2 AND LEFT SENTINEL LYMPH NODE MAPPING (Left Breast)     Patient location during evaluation: PACU Anesthesia Type: Regional and General Level of consciousness: awake and alert Pain management: pain level controlled Vital Signs Assessment: post-procedure vital signs reviewed and stable Respiratory status: spontaneous breathing, nonlabored ventilation, respiratory function stable and patient connected to nasal cannula oxygen Cardiovascular status: blood pressure returned to baseline and stable Postop Assessment: no apparent nausea or vomiting Anesthetic complications: no    Last Vitals:  Vitals:   04/08/19 1645 04/08/19 1711  BP: 138/89 139/81  Pulse: 88 90  Resp: 14 16  Temp:  (!) 36.2 C  SpO2: 96% 94%    Last Pain:  Vitals:   04/09/19 1212  TempSrc:   PainSc: 2    Pain Goal: Patients Stated Pain Goal: 6 (04/08/19 1354)                 Chelsey L Woodrum

## 2019-04-26 NOTE — Progress Notes (Signed)
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  Physical Exam: palpable focal thickening in the 2 o'clock location of the left breast.  Sonographically, an irregular hypoechoic mass with internal vascularity in the 2 o'clock location of the left breast 10 cm from the nipple is seen.  The mass measures 1.8 x 1.1 x 1.0 cm.  There is associated posterior acoustic enhancement.  Evaluation of the left breast is negative for adenopathy.  Diagnostic mammogram/US 09/23/2018: Breast density category B.  There is a mass with irregular margins in the upper outer quadrant of the left breast.  Screening mammogram 09/23/2018: possible abnormality in the left breast.  Histology per Pathology Report: Left Breast 09/23/2018   Receptor Status: ER(100% +), PR (), Her2-neu (+), Ki-()    Past/Anticipated interventions by surgeon, if any: Dr. Brantley Stage -Left breast radioactive seed lumpectomy x2 and left sentinel lymph node mapping 04/08/2019   Past/Anticipated interventions by medical oncology, if any: Chemotherapy  Dr. Jana Hakim 03/16/2019 (1) neoadjuvant chemotherapy consisting of carboplatin, docetaxel, and trastuzumab started 10/22/2018, to be repeated every 21 days x 6             (a) echocardiogram on 10/07/2018 shows a LVEF of 60-65%.             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  -Once she recovers from her surgery she will be ready to proceed to radiation.  We are going to hold antiestrogens until she recovers from radiation   Lymphedema issues, if any:  No  Pain issues, if any: Sore for fluid drainage yesterday at surgeons office.  SAFETY ISSUES:  Prior radiation? No  Pacemaker/ICD? No  Possible current pregnancy? Postmenopausal  Is the patient on methotrexate? No  Current Complaints / other details:   -Had fluid drained yesterday- 70cc  -Has port    Cori Razor, RN 04/26/2019,10:16 AM

## 2019-04-27 ENCOUNTER — Encounter: Payer: Self-pay | Admitting: Radiation Oncology

## 2019-04-27 ENCOUNTER — Other Ambulatory Visit: Payer: Self-pay

## 2019-04-27 ENCOUNTER — Inpatient Hospital Stay: Payer: 59

## 2019-04-27 ENCOUNTER — Ambulatory Visit
Admission: RE | Admit: 2019-04-27 | Discharge: 2019-04-27 | Disposition: A | Discharge status: Home / Self Care | Payer: 59 | Source: Ambulatory Visit | Attending: Radiation Oncology | Admitting: Radiation Oncology

## 2019-04-27 ENCOUNTER — Inpatient Hospital Stay: Payer: 59 | Attending: Oncology

## 2019-04-27 ENCOUNTER — Inpatient Hospital Stay (HOSPITAL_BASED_OUTPATIENT_CLINIC_OR_DEPARTMENT_OTHER): Payer: 59 | Admitting: Adult Health

## 2019-04-27 VITALS — Ht 62.0 in | Wt 200.0 lb

## 2019-04-27 VITALS — BP 130/74 | HR 94 | Temp 98.5°F | Resp 18 | Ht 62.0 in | Wt 205.0 lb

## 2019-04-27 DIAGNOSIS — Z5112 Encounter for antineoplastic immunotherapy: Secondary | ICD-10-CM | POA: Insufficient documentation

## 2019-04-27 DIAGNOSIS — T451X5S Adverse effect of antineoplastic and immunosuppressive drugs, sequela: Secondary | ICD-10-CM | POA: Insufficient documentation

## 2019-04-27 DIAGNOSIS — G620 Drug-induced polyneuropathy: Secondary | ICD-10-CM | POA: Insufficient documentation

## 2019-04-27 DIAGNOSIS — Z170 Estrogen receptor positive status [ER+]: Secondary | ICD-10-CM | POA: Diagnosis not present

## 2019-04-27 DIAGNOSIS — C50412 Malignant neoplasm of upper-outer quadrant of left female breast: Secondary | ICD-10-CM

## 2019-04-27 DIAGNOSIS — K219 Gastro-esophageal reflux disease without esophagitis: Secondary | ICD-10-CM | POA: Diagnosis not present

## 2019-04-27 DIAGNOSIS — Z794 Long term (current) use of insulin: Secondary | ICD-10-CM | POA: Diagnosis not present

## 2019-04-27 DIAGNOSIS — Z95828 Presence of other vascular implants and grafts: Secondary | ICD-10-CM

## 2019-04-27 DIAGNOSIS — E109 Type 1 diabetes mellitus without complications: Secondary | ICD-10-CM | POA: Insufficient documentation

## 2019-04-27 DIAGNOSIS — Z9889 Other specified postprocedural states: Secondary | ICD-10-CM | POA: Diagnosis not present

## 2019-04-27 DIAGNOSIS — Z79899 Other long term (current) drug therapy: Secondary | ICD-10-CM | POA: Insufficient documentation

## 2019-04-27 DIAGNOSIS — Z780 Asymptomatic menopausal state: Secondary | ICD-10-CM | POA: Diagnosis not present

## 2019-04-27 DIAGNOSIS — Z791 Long term (current) use of non-steroidal anti-inflammatories (NSAID): Secondary | ICD-10-CM | POA: Diagnosis not present

## 2019-04-27 DIAGNOSIS — Z9221 Personal history of antineoplastic chemotherapy: Secondary | ICD-10-CM | POA: Diagnosis not present

## 2019-04-27 LAB — COMPREHENSIVE METABOLIC PANEL
ALT: 22 U/L (ref 0–44)
AST: 16 U/L (ref 15–41)
Albumin: 3.9 g/dL (ref 3.5–5.0)
Alkaline Phosphatase: 119 U/L (ref 38–126)
Anion gap: 7 (ref 5–15)
BUN: 10 mg/dL (ref 6–20)
CO2: 28 mmol/L (ref 22–32)
Calcium: 9 mg/dL (ref 8.9–10.3)
Chloride: 105 mmol/L (ref 98–111)
Creatinine, Ser: 0.84 mg/dL (ref 0.44–1.00)
GFR calc Af Amer: >60 mL/min (ref >60)
GFR calc non Af Amer: >60 mL/min (ref >60)
Glucose, Bld: 109 mg/dL — ABNORMAL HIGH (ref 70–99)
Potassium: 4 mmol/L (ref 3.5–5.1)
Sodium: 140 mmol/L (ref 135–145)
Total Bilirubin: 0.3 mg/dL (ref 0.3–1.2)
Total Protein: 7.1 g/dL (ref 6.5–8.1)

## 2019-04-27 LAB — CBC WITH DIFFERENTIAL/PLATELET
Abs Immature Granulocytes: 0.02 10*3/uL (ref 0.00–0.07)
Basophils Absolute: 0 10*3/uL (ref 0.0–0.1)
Basophils Relative: 1 %
Eosinophils Absolute: 0.2 10*3/uL (ref 0.0–0.5)
Eosinophils Relative: 4 %
HCT: 33.5 % — ABNORMAL LOW (ref 36.0–46.0)
Hemoglobin: 10.5 g/dL — ABNORMAL LOW (ref 12.0–15.0)
Immature Granulocytes: 0 %
Lymphocytes Relative: 32 %
Lymphs Abs: 1.7 10*3/uL (ref 0.7–4.0)
MCH: 27.9 pg (ref 26.0–34.0)
MCHC: 31.3 g/dL (ref 30.0–36.0)
MCV: 88.9 fL (ref 80.0–100.0)
Monocytes Absolute: 0.4 10*3/uL (ref 0.1–1.0)
Monocytes Relative: 8 %
Neutro Abs: 2.9 10*3/uL (ref 1.7–7.7)
Neutrophils Relative %: 55 %
Platelets: 256 10*3/uL (ref 150–400)
RBC: 3.77 MIL/uL — ABNORMAL LOW (ref 3.87–5.11)
RDW: 12.4 % (ref 11.5–15.5)
WBC: 5.2 10*3/uL (ref 4.0–10.5)
nRBC: 0 % (ref 0.0–0.2)

## 2019-04-27 MED ORDER — HEPARIN SOD (PORK) LOCK FLUSH 100 UNIT/ML IV SOLN
500.0000 [IU] | Freq: Once | INTRAVENOUS | Status: AC | PRN
  Administered 2019-04-27: 500 [IU]
  Filled 2019-04-27: qty 5

## 2019-04-27 MED ORDER — SODIUM CHLORIDE 0.9% FLUSH
10.0000 mL | INTRAVENOUS | Status: DC | PRN
  Administered 2019-04-27: 10 mL
  Filled 2019-04-27: qty 10

## 2019-04-27 MED ORDER — SODIUM CHLORIDE 0.9 % IV SOLN
Freq: Once | INTRAVENOUS | Status: AC
  Administered 2019-04-27: 15:00:00 via INTRAVENOUS
  Filled 2019-04-27: qty 250

## 2019-04-27 MED ORDER — DIPHENHYDRAMINE HCL 25 MG PO CAPS
25.0000 mg | ORAL_CAPSULE | Freq: Once | ORAL | Status: AC
  Administered 2019-04-27: 25 mg via ORAL

## 2019-04-27 MED ORDER — DIPHENHYDRAMINE HCL 25 MG PO CAPS
ORAL_CAPSULE | ORAL | Status: AC
  Filled 2019-04-27: qty 1

## 2019-04-27 MED ORDER — TRASTUZUMAB-DKST CHEMO 150 MG IV SOLR
600.0000 mg | Freq: Once | INTRAVENOUS | Status: AC
  Administered 2019-04-27: 600 mg via INTRAVENOUS
  Filled 2019-04-27: qty 28.57

## 2019-04-27 MED ORDER — ALTEPLASE 2 MG IJ SOLR
2.0000 mg | Freq: Once | INTRAMUSCULAR | Status: DC | PRN
  Filled 2019-04-27: qty 2

## 2019-04-27 MED ORDER — ACETAMINOPHEN 325 MG PO TABS
ORAL_TABLET | ORAL | Status: AC
  Filled 2019-04-27: qty 2

## 2019-04-27 MED ORDER — ACETAMINOPHEN 325 MG PO TABS
650.0000 mg | ORAL_TABLET | Freq: Once | ORAL | Status: AC
  Administered 2019-04-27: 650 mg via ORAL

## 2019-04-27 NOTE — Patient Instructions (Signed)
Trastuzumab injection for infusion What is this medicine? TRASTUZUMAB (tras TOO zoo mab) is a monoclonal antibody. It is used to treat breast cancer and stomach cancer. This medicine may be used for other purposes; ask your health care provider or pharmacist if you have questions. COMMON BRAND NAME(S): Herceptin, Herzuma, KANJINTI, Ogivri, Ontruzant, Trazimera What should I tell my health care provider before I take this medicine? They need to know if you have any of these conditions:  heart disease  heart failure  lung or breathing disease, like asthma  an unusual or allergic reaction to trastuzumab, benzyl alcohol, or other medications, foods, dyes, or preservatives  pregnant or trying to get pregnant  breast-feeding How should I use this medicine? This drug is given as an infusion into a vein. It is administered in a hospital or clinic by a specially trained health care professional. Talk to your pediatrician regarding the use of this medicine in children. This medicine is not approved for use in children. Overdosage: If you think you have taken too much of this medicine contact a poison control center or emergency room at once. NOTE: This medicine is only for you. Do not share this medicine with others. What if I miss a dose? It is important not to miss a dose. Call your doctor or health care professional if you are unable to keep an appointment. What may interact with this medicine? This medicine may interact with the following medications:  certain types of chemotherapy, such as daunorubicin, doxorubicin, epirubicin, and idarubicin This list may not describe all possible interactions. Give your health care provider a list of all the medicines, herbs, non-prescription drugs, or dietary supplements you use. Also tell them if you smoke, drink alcohol, or use illegal drugs. Some items may interact with your medicine. What should I watch for while using this medicine? Visit your  doctor for checks on your progress. Report any side effects. Continue your course of treatment even though you feel ill unless your doctor tells you to stop. Call your doctor or health care professional for advice if you get a fever, chills or sore throat, or other symptoms of a cold or flu. Do not treat yourself. Try to avoid being around people who are sick. You may experience fever, chills and shaking during your first infusion. These effects are usually mild and can be treated with other medicines. Report any side effects during the infusion to your health care professional. Fever and chills usually do not happen with later infusions. Do not become pregnant while taking this medicine or for 7 months after stopping it. Women should inform their doctor if they wish to become pregnant or think they might be pregnant. Women of child-bearing potential will need to have a negative pregnancy test before starting this medicine. There is a potential for serious side effects to an unborn child. Talk to your health care professional or pharmacist for more information. Do not breast-feed an infant while taking this medicine or for 7 months after stopping it. Women must use effective birth control with this medicine. What side effects may I notice from receiving this medicine? Side effects that you should report to your doctor or health care professional as soon as possible:  allergic reactions like skin rash, itching or hives, swelling of the face, lips, or tongue  chest pain or palpitations  cough  dizziness  feeling faint or lightheaded, falls  fever  general ill feeling or flu-like symptoms  signs of worsening heart failure like   breathing problems; swelling in your legs and feet  unusually weak or tired Side effects that usually do not require medical attention (report to your doctor or health care professional if they continue or are bothersome):  bone pain  changes in  taste  diarrhea  joint pain  nausea/vomiting  weight loss This list may not describe all possible side effects. Call your doctor for medical advice about side effects. You may report side effects to FDA at 1-800-FDA-1088. Where should I keep my medicine? This drug is given in a hospital or clinic and will not be stored at home. NOTE: This sheet is a summary. It may not cover all possible information. If you have questions about this medicine, talk to your doctor, pharmacist, or health care provider.  2020 Elsevier/Gold Standard (2016-07-23 14:37:52)  

## 2019-04-27 NOTE — Progress Notes (Addendum)
[place]  [phone] [phone]    ID: Natasha Huffman DOB: January 30, 1961  MR#: [id number]  BUL#:[id number]  Patient Care Team: [name], MD as PCP - General (Family Medicine) Rockwell Germany, RN as Oncology Nurse Navigator Mauro Kaufmann, RN as Oncology Nurse Navigator [name], MD as Consulting Physician (General Surgery) Natasha Huffman, Natasha Dad, MD as Consulting Physician (Oncology) Kyung Rudd, MD as Consulting Physician (Radiation Oncology) Larey Dresser, MD as Consulting Physician (Cardiology) OTHER MD:    CHIEF COMPLAINT: Triple positive breast cancer  CURRENT TREATMENT: Continuing trastuzumab; s/p surgery; awaiting radiation  INTERVAL HISTORY: Natasha Huffman returns today for follow-up and treatment of her HER-2 positive breast cancer.   Since her last visit, she underwent left breast lumpectomy and sentinel lymph node biopsy on 04/08/2019 that demonstrated 1.2cm of residual invasive ductal carcinoma, ER positive, HER 2 positive, and 4 lymph nodes biopsied were negative for involvement.  She returns today to continue on adjuvant therapy and to review her pathology results.    REVIEW OF SYSTEMS: Natasha Huffman is feeling moderately well since surgery.  She is recovering well and has no new issues.  She denies any headaches, vision changes.  She is without cough, shortness of breath, chest pain, or palpitations.  She has no nausea, vomiting, bowel/bladder changes.  A detailed ROS was otherwise non contributory.     HISTORY OF CURRENT ILLNESS: From the original intake note:  Natasha Huffman had routine screening mammography on 09/23/2018 showing a possible abnormality in the left breast. She underwent unilateral left diagnostic mammography with tomography and left breast ultrasonography at [facility] on 09/23/2018 showing: Breast Density Category B. There is a mass with irregular margins in the upper-outer quadrant of the left breast. On  physical exam, there is palpable focal thickening in the 2 o'clock location of the left breast. Sonographically, an irregular hypoechoic mass with internal vascularity in the 2 o'clock location of the left breast 10 cm from the nipple is seen. The mass measures 1.8 x 1.1 x 1.0 cm. There is associated posterior acoustic enhancement. Evaluation of the left axilla is negative for adenopathy.  Accordingly on 09/23/2018 she proceeded to biopsy of the left breast area in question. The pathology from this procedure showed ([id number]): invasive ductal carcinoma, grade III. Prognostic indicators significant for: estrogen receptor, 100% positive and progesterone receptor, 2% positive, both with strong staining intensity. Proliferation marker Ki67 at 40%. HER2 positive (3+) by immunohistochemistry.   The patient's subsequent history is as detailed above.   PAST MEDICAL HISTORY: Past Medical History:  Diagnosis Date   Cancer ([place]) 09/23/2018   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 ,    Gallstones    GERD (gastroesophageal reflux disease)    resolved    Peripheral neuropathy due to chemotherapy ([place])      PAST SURGICAL HISTORY: Past Surgical History:  Procedure Laterality Date   BREAST LUMPECTOMY WITH RADIOACTIVE SEED AND SENTINEL LYMPH NODE BIOPSY Left 04/08/2019   Procedure: LEFT BREAST RADIOACTIVE SEED LUMPECTOMY X2 AND LEFT SENTINEL LYMPH NODE MAPPING;  Surgeon: [name], MD;  Location: [place];  Service: General;  Laterality: Left;   [place]; 1995   CHOLECYSTECTOMY N/A 02/22/2015   Procedure: LAPAROSCOPIC CHOLECYSTECTOMY WITH INTRAOPERATIVE CHOLANGIOGRAM;  Surgeon: [name], MD;  Location: [place];  Service: General;  Laterality: N/A;   LAPAROSCOPIC CHOLECYSTECTOMY  02/22/2015   PORTACATH PLACEMENT Right 10/08/2018   Procedure: INSERTION PORT-A-CATH WITH ULTRASOUND;  Surgeon:  [name], MD;  Location: [facility];  Service: General;  Laterality:  Right;   TUBAL LIGATION  1995     FAMILY HISTORY: Family History  Problem Relation Age of Onset   Hypertension Sister    Colon cancer Maternal Aunt    Breast cancer Neg Hx    [name]'s father died from sepsis at age 26. Patients' mother is 3 years old as of 09/2018. The patient has 3 brothers and 5 sisters. Patient denies anyone in her family having breast, ovarian, prostate, or pancreatic cancer. Jadelin's maternal aunt was diagnosed with colon cancer at age 2.    GYNECOLOGIC HISTORY:  No LMP recorded. Patient is postmenopausal. 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 Her husband, A. Natasha Huffman, works at a [employer] in [place]. 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 Natasha Huffman has two children, Natasha Huffman and Natasha Huffman. Natasha Huffman is 3, lives in [place], and is a Animator. Natasha Huffman is 65, lives in [place], and works for the [employer]. Natasha Huffman has one grandchild. She attends the [facility] in [place]: Natasha Huffman's husband, Natasha Huffman, is automatically her healthcare power of attorney.     HEALTH MAINTENANCE: Social History   Tobacco Use   Smoking status: Never Smoker   Smokeless tobacco: Never Used  Substance Use Topics   Alcohol use: No   Drug use: No    Colonoscopy: yes, 2015, [place]  PAP: 09/2017  Bone density: no   No Known Allergies  Current Outpatient Medications  Medication Sig Dispense Refill   gabapentin (NEURONTIN) 100 MG capsule Take one tablet three times a day with meals and 2 tablets at bedtime 150 capsule 1   ibuprofen (ADVIL) 800 MG tablet Take 1 tablet (800 mg total) by mouth every 8 (eight) hours as needed. 30 tablet 0   lidocaine-prilocaine (EMLA) cream Apply  to affected area once 30 g 3   LORazepam (ATIVAN) 0.5 MG tablet TAKE 1 TABLET(0.5 MG) BY MOUTH AT BEDTIME AS NEEDED FOR NAUSEA OR VOMITING 20 tablet 0   oxyCODONE (OXY IR/ROXICODONE) 5 MG immediate release tablet Take 1 tablet (5 mg total) by mouth every 6 (six) hours as needed for severe pain. 15 tablet 0   prochlorperazine (COMPAZINE) 10 MG tablet TAKE 1 TABLET(10 MG) BY MOUTH EVERY 6 HOURS AS NEEDED FOR NAUSEA OR VOMITING 30 tablet 1   No current facility-administered medications for this visit.       OBJECTIVE:    Vitals:   04/27/19 1329  BP: 130/74  Pulse: 94  Resp: 18  Temp: 98.5 F (36.9 C)  SpO2: 97%   Body mass index is 37.49 kg/m. Wt Readings from Last 3 Encounters:  04/27/19 205 lb (93 kg)  04/27/19 200 lb (90.7 kg)  04/08/19 200 lb 6.4 oz (90.9 kg)  ECOG FS: 1 GENERAL: Patient is a well appearing female in no acute distress HEENT:  Sclerae anicteric.  Oropharynx clear and moist. No ulcerations or evidence of oropharyngeal candidiasis. Neck is supple.  NODES:  No cervical, supraclavicular, or axillary lymphadenopathy palpated.  BREAST EXAM:  S/p lumpectomy, mild swelling present, healing well LUNGS:  Clear to auscultation bilaterally.  No wheezes or rhonchi. HEART:  Regular rate and rhythm. No murmur appreciated.  ABDOMEN:  Soft, nontender.  Positive, normoactive bowel sounds. No organomegaly palpated. MSK:  No focal spinal tenderness to palpation. Full range of motion bilaterally in the upper extremities. EXTREMITIES:  No peripheral edema.   SKIN:  Clear with no obvious rashes or skin changes. No nail dyscrasia. NEURO:  Nonfocal. Well oriented.  Appropriate affect.    LAB RESULTS:  CMP     Component Value Date/Time   NA 141 04/06/2019 0834   K 4.5 04/06/2019 0834   CL 106 04/06/2019 0834   CO2 23 04/06/2019 0834   GLUCOSE 113 (H) 04/06/2019 0834   BUN 15 04/06/2019 0834   CREATININE 0.82 04/06/2019 0834   CREATININE 0.71 01/11/2019 0815   CALCIUM 9.3  04/06/2019 0834   PROT 7.3 04/06/2019 0834   ALBUMIN 3.9 04/06/2019 0834   AST 24 04/06/2019 0834   AST 19 01/11/2019 0815   ALT 21 04/06/2019 0834   ALT 42 01/11/2019 0815   ALKPHOS 109 04/06/2019 0834   BILITOT 0.3 04/06/2019 0834   BILITOT 0.3 01/11/2019 0815   GFRNONAA >60 04/06/2019 0834   GFRNONAA >60 01/11/2019 0815   GFRAA >60 04/06/2019 0834   GFRAA >60 01/11/2019 0815    No results found for: TOTALPROTELP, ALBUMINELP, A1GS, A2GS, BETS, BETA2SER, GAMS, MSPIKE, SPEI  No results found for: KPAFRELGTCHN, LAMBDASER, KAPLAMBRATIO  Lab Results  Component Value Date   WBC 5.2 04/27/2019   NEUTROABS 2.9 04/27/2019   HGB 10.5 (L) 04/27/2019   HCT 33.5 (L) 04/27/2019   MCV 88.9 04/27/2019   PLT 256 04/27/2019    _0 @  No results found for: LABCA2  No components found for: GEXBMW413  No results for input(s): INR in the last 168 hours.  No results found for: LABCA2  No results found for: KGM010  No results found for: UVO536  No results found for: UYQ034  No results found for: CA2729  No components found for: HGQUANT  No results found for: CEA1 / No results found for: CEA1   No results found for: AFPTUMOR  No results found for: CHROMOGRNA  No results found for: PSA1  Appointment on 04/27/2019  Component Date Value Ref Range Status   WBC 04/27/2019 5.2  4.0 - 10.5 K/uL Final   RBC 04/27/2019 3.77* 3.87 - 5.11 MIL/uL Final   Hemoglobin 04/27/2019 10.5* 12.0 - 15.0 g/dL Final   HCT 04/27/2019 33.5* 36.0 - 46.0 % Final   MCV 04/27/2019 88.9  80.0 - 100.0 fL Final   MCH 04/27/2019 27.9  26.0 - 34.0 pg Final   MCHC 04/27/2019 31.3  30.0 - 36.0 g/dL Final   RDW 04/27/2019 12.4  11.5 - 15.5 % Final   Platelets 04/27/2019 256  150 - 400 K/uL Final   nRBC 04/27/2019 0.0  0.0 - 0.2 % Final   Neutrophils Relative % 04/27/2019 55  % Final   Neutro Abs 04/27/2019 2.9  1.7 - 7.7 K/uL Final   Lymphocytes Relative 04/27/2019 32  % Final    Lymphs Abs 04/27/2019 1.7  0.7 - 4.0 K/uL Final   Monocytes Relative 04/27/2019 8  % Final   Monocytes Absolute 04/27/2019 0.4  0.1 - 1.0 K/uL Final   Eosinophils Relative 04/27/2019 4  % Final   Eosinophils Absolute 04/27/2019 0.2  0.0 - 0.5 K/uL Final   Basophils Relative 04/27/2019 1  % Final   Basophils Absolute 04/27/2019 0.0  0.0 - 0.1 K/uL Final   Immature Granulocytes 04/27/2019 0  % Final   Abs Immature  Granulocytes 04/27/2019 0.02  0.00 - 0.07 K/uL Final   Performed at [facility], [address] [place], [place] [address]    (this displays the last labs from the last 3 days)  No results found for: TOTALPROTELP, ALBUMINELP, A1GS, A2GS, BETS, BETA2SER, GAMS, MSPIKE, SPEI (this displays SPEP labs)  No results found for: KPAFRELGTCHN, LAMBDASER, KAPLAMBRATIO (kappa/lambda light chains)  No results found for: HGBA, HGBA2QUANT, HGBFQUANT, HGBSQUAN (Hemoglobinopathy evaluation)   No results found for: LDH  No results found for: IRON, TIBC, IRONPCTSAT (Iron and TIBC)  No results found for: FERRITIN  Urinalysis    Component Value Date/Time   COLORURINE YELLOW 02/22/2015 0118   APPEARANCEUR CLOUDY (A) 02/22/2015 0118   LABSPEC 1.019 02/22/2015 0118   PHURINE 7.0 02/22/2015 0118   GLUCOSEU NEGATIVE 02/22/2015 0118   HGBUR NEGATIVE 02/22/2015 0118   BILIRUBINUR NEGATIVE 02/22/2015 0118   KETONESUR NEGATIVE 02/22/2015 0118   PROTEINUR NEGATIVE 02/22/2015 0118   UROBILINOGEN 1.0 02/22/2015 0118   NITRITE NEGATIVE 02/22/2015 0118   LEUKOCYTESUR NEGATIVE 02/22/2015 0118     STUDIES:  Nm Sentinel Node Inj-no Rpt (breast)  Result Date: 04/08/2019 Sulfur colloid was injected by the nuclear medicine technologist for melanoma sentinel node.   Mm Breast Surgical Specimen  Result Date: 04/08/2019 CLINICAL DATA:  Post left breast lumpectomy. EXAM: SPECIMEN RADIOGRAPH OF THE LEFT BREAST COMPARISON:  Previous exam(s). FINDINGS: Status post  excision of the left breast. The radioactive seed and ribbon shaped biopsy marker clip are present, completely intact, and were marked for pathology. IMPRESSION: Specimen radiograph of the left breast. Electronically Signed   By: Everlean Alstrom M.D.   On: 04/08/2019 15:33   Mm Breast Surgical Specimen  Result Date: 04/08/2019 CLINICAL DATA:  Post left breast excision. EXAM: SPECIMEN RADIOGRAPH OF THE LEFT BREAST COMPARISON:  Previous exam(s). FINDINGS: Status post excision of the left breast. The radioactive seed and dumbbell shaped biopsy marker clip are present, completely intact, and were marked for pathology. IMPRESSION: Specimen radiograph of the left breast. Electronically Signed   By: Everlean Alstrom M.D.   On: 04/08/2019 15:18   Mm Lt Radioactive Seed Loc Mammo Guide  Result Date: 04/08/2019 CLINICAL DATA:  58 year old female presents for preoperative radio active seed localization for papilloma in the medial left breast and malignancy in the upper-outer left breast. EXAM: MAMMOGRAPHIC GUIDED RADIOACTIVE SEED LOCALIZATION OF THE LEFT BREAST COMPARISON:  Previous exam(s). FINDINGS: Patient presents for radioactive seed localization prior to lumpectomy for left breast malignancy at the 2 o'clock position and excision for papilloma medially. I met with the patient and we discussed the procedure of seed localization including benefits and alternatives. We discussed the high likelihood of a successful procedure. We discussed the risks of the procedure including infection, bleeding, tissue injury and further surgery. We discussed the low dose of radioactivity involved in the procedure. Informed, written consent was given. The usual time-out protocol was performed immediately prior to the procedure. SITE 1: MEDIAL LEFT BREAST: PAPILLOMA/DUMBBELL CLIP: Using mammographic guidance, sterile technique, 1% lidocaine and an I-125 radioactive seed, the papilloma at site of dumbbell shaped biopsy marking clip  was localized using a medial to lateral approach. The follow-up mammogram images confirm the seed in the expected location and were marked for Dr. Brantley Stage. Follow-up survey of the patient confirms presence of the radioactive seed. Order number of I-125 seed:  [id number]. Total activity:  3.220 millicuries reference Date: 03/30/2019 SITE 2: 2 O'CLOCK LEFT BREAST: MALIGNANCY/RIBBON CLIP: Using mammographic guidance,  sterile technique, 1% lidocaine and an I-125 radioactive seed, the malignancy with associated ribbon shaped biopsy marking clip was localized using a lateral to medial approach. The follow-up mammogram images confirm the seed in the expected location and were marked for Dr. Brantley Stage. Follow-up survey of the patient confirms presence of the radioactive seed. Order number of I-125 seed:  [id number]. Total activity:  9.675 millicuries reference Date: 03/19/2019 The patient tolerated the procedure well and was released from the [facility]. She was given instructions regarding seed removal. IMPRESSION: Radioactive seed localization of malignancy in the upper-outer left breast and palpable in the slightly inner left breast. Electronically Signed   By: Everlean Alstrom M.D.   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 EXAM: MAMMOGRAPHIC GUIDED RADIOACTIVE SEED LOCALIZATION OF THE LEFT BREAST COMPARISON:  Previous exam(s). FINDINGS: Patient presents for radioactive seed localization prior to lumpectomy for left breast malignancy at the 2 o'clock position and excision for papilloma medially. I met with the patient and we discussed the procedure of seed localization including benefits and alternatives. We discussed the high likelihood of a successful procedure. We discussed the risks of the  procedure including infection, bleeding, tissue injury and further surgery. We discussed the low dose of radioactivity involved in the procedure. Informed, written consent was given. The usual time-out protocol was performed immediately prior to the procedure. SITE 1: MEDIAL LEFT BREAST: PAPILLOMA/DUMBBELL CLIP: Using mammographic guidance, sterile technique, 1% lidocaine and an I-125 radioactive seed, the papilloma at site of dumbbell shaped biopsy marking clip was localized using a medial to lateral approach. The follow-up mammogram images confirm the seed in the expected location and were marked for Dr. Brantley Stage. Follow-up survey of the patient confirms presence of the radioactive seed. Order number of I-125 seed:  [id number]. Total activity:  9.935 millicuries reference Date: 03/30/2019 SITE 2: 2 O'CLOCK LEFT BREAST: MALIGNANCY/RIBBON CLIP: Using mammographic guidance, sterile technique, 1% lidocaine and an I-125 radioactive seed, the malignancy with associated ribbon shaped biopsy marking clip was localized using a lateral to medial approach. The follow-up mammogram images confirm the seed in the expected location and were marked for Dr. Brantley Stage. Follow-up survey of the patient confirms presence of the radioactive seed. Order number of I-125 seed:  [id number]. Total activity:  3.009 millicuries reference Date: 03/19/2019 The patient tolerated the procedure well and was released from the [facility]. She was given instructions regarding seed removal. IMPRESSION: Radioactive seed localization of malignancy in the upper-outer left breast and palpable in the slightly inner left breast. Electronically Signed   By: Everlean Alstrom M.D.   On: 04/08/2019 11:57     ELIGIBLE FOR AVAILABLE RESEARCH PROTOCOL: no   ASSESSMENT: 58 y.o. [name], [place] woman status post left breast upper outer quadrant biopsy 09/23/2018 for a clinical T1c N0, stage IA invasive ductal carcinoma, grade 3, triple positive, with an MIB-1  of 40%  (1) neoadjuvant chemotherapy consisting of carboplatin, docetaxel, and trastuzumab started 10/22/2018, to be repeated every 21 days x 6  (a) echocardiogram on 10/07/2018 shows a LVEF of 60-65%.  (b) docetaxel discontinued and gemcitabine substituted with cycle 5 because of neuropathy  (2) trastuzumab to continue to complete a year (through March 2021)  (a) echocardiogram 01/25/2019 showed an ejection fraction of greater than 65%  (b)  trastuzumab changed to T-DM1 post-op  (3) status post left lumpectomy and sentinel lymph node sampling 04/08/2019 showing a residual ypT1c ypN0 residual invasive ductal carcinoma, grade 3, margins negative, again HER-2 positive  (a) 4 lymph nodes were negative  (4) TDM-1 to start on 05/18/2019 due to residual disease after neoadjuvant chemotherapy  (4) adjuvant radiation to start within next 2 weeks  (5) antiestrogens to start at the completion of local treatment    PLAN: Annelise has completed her chemotherapy and is healing well from her surgery.  She met with myself and Dr. Jana Hakim and we reviewed her pathology results.  Due to her residual disease after neoadjuvant treatment she will receive Kadcyla given every 3 weeks, starting 05/18/2019.  This was reviewed with her by Dr. Tana Felts, and she understands the risks/benefits and is willing to proceed with the treatment.    She has been referred to radiation oncology for consultation regarding adjuvant radiation, and this is going to start within the next 2 weeks depending on her healing.    Azarria will receive the Trastuzumab today, and will return in 3 weeks to start TDM-1.  She was recommended to continue with the appropriate pandemic precautions. She knows to call for any questions that may arise between now and her next appointment.  We are happy to see her sooner if needed.   Wilber Bihari, NP  04/27/19 1:40 PM Medical Oncology and Hematology [facility] [address] [place], [place] [address] Tel. [phone]    Fax. [phone]   ADDENDUM: IRWERX did generally well with her chemotherapy and there is no evidence of endorgan damage.  She is already getting her hair back which is very favorable.  She did have residual invasive disease, which was confirmed to be still HER-2/neu strongly positive.  It is not infrequent for [place] to clearly HER-2 positive disease with neoadjuvant anti-HER-2 treatment even if the patient does not attain a complete pathologic response.  This is of concern.  Accordingly we discussed intensifying her anti-HER-2 treatment and she will start T-DM1 with her next cycle.  She has a good understanding of the possible toxicities, side effects and complications of this agent.  We will continue it to total at least 1 year from the start of her trastuzumab  Once she completes radiation she will start anastrozole.  I personally saw this patient and performed a substantive portion of this encounter with the listed APP documented above.   [name], MD Medical Oncology and Hematology [facility] [address] [place], [place] [address] Tel. [phone]    Fax. [phone]

## 2019-04-27 NOTE — Progress Notes (Signed)
Radiation Oncology         [phone] ________________________________  Name: Natasha Huffman        MRN: [id number]  Date of Service: 04/27/2019 DOB: 1961/07/20  CC:[name], MD  [name], [name], MD     REFERRING PHYSICIAN: [name], [name], MD   DIAGNOSIS: The encounter diagnosis was Malignant neoplasm of upper-outer quadrant of left breast in female, estrogen receptor positive ([place]).   HISTORY OF PRESENT ILLNESS: Natasha Huffman is a 58 y.o. female originally seen in the multidisciplinary breast clinic for a new diagnosis of left breast cancer. The patient was noted to have a screening detected mass in the left breast in the posterior upper outer quadrant. Diagnostic imaging revealed a mass at 2:00 measuring 1.8 x 1.1 x 1 cm and her axilla was negative for adenopathy. A biopsy on 09/23/2018 revealed a grade 3, invasive ductal carcinoma and her tumor was triple positive (PR was 2%) with a Ki 67 of 40%. An MRI of bilateral breasts on 10/01/2018 revealed her known malignancy with several satellite lesions in total measuring up to 4.2 cm, and a 5.7 mm indeterminitate mass in the left breast posterior to the nipple. . No adenopathy was noted. A biopsy of the indeterminate lesion on MRI was performed on 10/12/2018 and this revealed a ductal papilloma She began neoadjuvant chemotherapy on 10/22/2018, and completed this on 02/02/2019. She had post chemotherapy MRI repeated on 02/08/2019.  That revealed improvement in her primary lesion, and less apparent changes of the papilloma, no adenopathy was noted. She also underwent lumpectomy with sentinel node biopsy on 04/08/2019 which revealed a grade 3 invasive ductal carcinoma measuring 1.2 cm, with clear margins, and all 4 sampled lymph nodes were negative. She is seen today via MyChart to discuss adjuvant radiotherapy.    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 ,    Gallstones    GERD (gastroesophageal reflux disease)    resolved    Peripheral neuropathy due to chemotherapy ([place])        PAST SURGICAL HISTORY: Past Surgical History:  Procedure Laterality Date   BREAST LUMPECTOMY WITH RADIOACTIVE SEED AND SENTINEL LYMPH NODE BIOPSY Left 04/08/2019   Procedure: LEFT BREAST RADIOACTIVE SEED LUMPECTOMY X2 AND LEFT SENTINEL LYMPH NODE MAPPING;  Surgeon: [name], MD;  Location: [facility];  Service: General;  Laterality: Left;   [place]; 1995   CHOLECYSTECTOMY N/A 02/22/2015   Procedure: LAPAROSCOPIC CHOLECYSTECTOMY WITH INTRAOPERATIVE CHOLANGIOGRAM;  Surgeon: [name], MD;  Location: [facility];  Service: General;  Laterality: N/A;   LAPAROSCOPIC CHOLECYSTECTOMY  02/22/2015   PORTACATH PLACEMENT Right 10/08/2018   Procedure: INSERTION PORT-A-CATH WITH ULTRASOUND;  Surgeon: [name], MD;  Location: [facility];  Service: General;  Laterality: Right;   TUBAL LIGATION  1995     FAMILY HISTORY:  Family History  Problem Relation Age of Onset   Hypertension Sister    Colon cancer Maternal Aunt    Breast cancer Neg Hx      SOCIAL HISTORY:  reports that she has never smoked. She has never used smokeless tobacco. She reports that she does not drink alcohol or use drugs. The patient is married and lives in [place]. She works for [employer] and works in Engineer, technical sales. She was planning a pilgrimage to [place] in July 2020,  but was able to reschedule this until the summer of 2021.   ALLERGIES: Patient has no known allergies.   MEDICATIONS:  Current Outpatient Medications  Medication Sig Dispense Refill   fluconazole (DIFLUCAN) 100 MG tablet TAKE 1 TABLET (100 MG TOTAL) BY MOUTH DAILY. 21 tablet 0   gabapentin (NEURONTIN) 100 MG capsule Take one tablet three times a day with meals and 2 tablets at bedtime 150 capsule 1   ibuprofen (ADVIL) 800 MG tablet Take 1  tablet (800 mg total) by mouth every 8 (eight) hours as needed. 30 tablet 0   lidocaine-prilocaine (EMLA) cream Apply to affected area once 30 g 3   LORazepam (ATIVAN) 0.5 MG tablet TAKE 1 TABLET(0.5 MG) BY MOUTH AT BEDTIME AS NEEDED FOR NAUSEA OR VOMITING 20 tablet 0   oxyCODONE (OXY IR/ROXICODONE) 5 MG immediate release tablet Take 1 tablet (5 mg total) by mouth every 6 (six) hours as needed for severe pain. 15 tablet 0   prochlorperazine (COMPAZINE) 10 MG tablet TAKE 1 TABLET(10 MG) BY MOUTH EVERY 6 HOURS AS NEEDED FOR NAUSEA OR VOMITING 30 tablet 1   No current facility-administered medications for this encounter.      REVIEW OF SYSTEMS: On review of systems, the patient reports that she is doing well overall. She feels as though she did very well with chemotherapy and with surgery. She did have about 70 cc of serous fluid removed yesterday from the breast, and is now wearing a compressive sports bra. She denies any chest pain, shortness of breath, cough, fevers, chills, night sweats, unintended weight changes. She denies any bowel or bladder disturbances, and denies abdominal pain, nausea or vomiting. She denies any new musculoskeletal or joint aches or pains. A complete review of systems is obtained and is otherwise negative.     PHYSICAL EXAM:  Unable to assess due to encounter type.  ECOG = 0  0 - Asymptomatic (Fully active, able to carry on all predisease activities without restriction)  1 - Symptomatic but completely ambulatory (Restricted in physically strenuous activity but ambulatory and able to carry out work of a light or sedentary nature. For example, light housework, office work)  2 - Symptomatic, <50% in bed during the day (Ambulatory and capable of all self care but unable to carry out any work activities. Up and about more than 50% of waking hours)  3 - Symptomatic, >50% in bed, but not bedbound (Capable of only limited self-care, confined to bed or chair 50% or more  of waking hours)  4 - Bedbound (Completely disabled. Cannot carry on any self-care. Totally confined to bed or chair)  5 - Death   Eustace Pen MM, Creech RH, Tormey DC, et al. [phone]). "Toxicity and response criteria of the [facility]". [place] Oncol. 5 (6): 649-55    LABORATORY DATA:  Lab Results  Component Value Date   WBC 4.5 04/06/2019   HGB 11.5 (L) 04/06/2019   HCT 36.2 04/06/2019   MCV 88.5 04/06/2019   PLT 252 04/06/2019   Lab Results  Component Value Date   NA 141 04/06/2019   K 4.5 04/06/2019   CL 106 04/06/2019   CO2 23 04/06/2019   Lab Results  Component Value Date   ALT 21 04/06/2019   AST 24 04/06/2019   ALKPHOS 109 04/06/2019   BILITOT 0.3 04/06/2019      RADIOGRAPHY: Nm Sentinel Node Inj-no Rpt (breast)  Result Date: 04/08/2019 Sulfur colloid was injected by the nuclear medicine  technologist for melanoma sentinel node.   Mm Breast Surgical Specimen  Result Date: 04/08/2019 CLINICAL DATA:  Post left breast lumpectomy. EXAM: SPECIMEN RADIOGRAPH OF THE LEFT BREAST COMPARISON:  Previous exam(s). FINDINGS: Status post excision of the left breast. The radioactive seed and ribbon shaped biopsy marker clip are present, completely intact, and were marked for pathology. IMPRESSION: Specimen radiograph of the left breast. Electronically Signed   By: Everlean Alstrom M.D.   On: 04/08/2019 15:33   Mm Breast Surgical Specimen  Result Date: 04/08/2019 CLINICAL DATA:  Post left breast excision. EXAM: SPECIMEN RADIOGRAPH OF THE LEFT BREAST COMPARISON:  Previous exam(s). FINDINGS: Status post excision of the left breast. The radioactive seed and dumbbell shaped biopsy marker clip are present, completely intact, and were marked for pathology. IMPRESSION: Specimen radiograph of the left breast. Electronically Signed   By: Everlean Alstrom M.D.   On: 04/08/2019 15:18   Mm Lt Radioactive Seed Loc Mammo Guide  Result Date: 04/08/2019 CLINICAL DATA:   57 year old female presents for preoperative radio active seed localization for papilloma in the medial left breast and malignancy in the upper-outer left breast. EXAM: MAMMOGRAPHIC GUIDED RADIOACTIVE SEED LOCALIZATION OF THE LEFT BREAST COMPARISON:  Previous exam(s). FINDINGS: Patient presents for radioactive seed localization prior to lumpectomy for left breast malignancy at the 2 o'clock position and excision for papilloma medially. I met with the patient and we discussed the procedure of seed localization including benefits and alternatives. We discussed the high likelihood of a successful procedure. We discussed the risks of the procedure including infection, bleeding, tissue injury and further surgery. We discussed the low dose of radioactivity involved in the procedure. Informed, written consent was given. The usual time-out protocol was performed immediately prior to the procedure. SITE 1: MEDIAL LEFT BREAST: PAPILLOMA/DUMBBELL CLIP: Using mammographic guidance, sterile technique, 1% lidocaine and an I-125 radioactive seed, the papilloma at site of dumbbell shaped biopsy marking clip was localized using a medial to lateral approach. The follow-up mammogram images confirm the seed in the expected location and were marked for Dr. [place]ant[place] Stage. Follow-up survey of the patient confirms presence of the radioactive seed. Order number of I-125 seed:  [id number]. Total activity:  2.440 millicuries reference Date: 03/30/2019 SITE 2: 2 O'CLOCK LEFT BREAST: MALIGNANCY/RIBBON CLIP: Using mammographic guidance, sterile technique, 1% lidocaine and an I-125 radioactive seed, the malignancy with associated ribbon shaped biopsy marking clip was localized using a lateral to medial approach. The follow-up mammogram images confirm the seed in the expected location and were marked for Dr. [place]ant[place] Stage. Follow-up survey of the patient confirms presence of the radioactive seed. Order number of I-125 seed:  [id number]. Total activity:  4.403  millicuries reference Date: 03/19/2019 The patient tolerated the procedure well and was released from the [facility]. She was given instructions regarding seed removal. IMPRESSION: Radioactive seed localization of malignancy in the upper-outer left breast and palpable in the slightly inner left breast. Electronically Signed   By: Everlean Alstrom M.D.   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 EXAM: MAMMOGRAPHIC GUIDED RADIOACTIVE SEED LOCALIZATION OF THE LEFT BREAST COMPARISON:  Previous exam(s). FINDINGS: Patient presents for radioactive seed localization prior to lumpectomy for left breast malignancy at the 2 o'clock position and excision for papilloma medially. I met with the patient and  we discussed the procedure of seed localization including benefits and alternatives. We discussed the high likelihood of a successful procedure. We discussed the risks of the procedure including infection, bleeding, tissue injury and further surgery. We discussed the low dose of radioactivity involved in the procedure. Informed, written consent was given. The usual time-out protocol was performed immediately prior to the procedure. SITE 1: MEDIAL LEFT BREAST: PAPILLOMA/DUMBBELL CLIP: Using mammographic guidance, sterile technique, 1% lidocaine and an I-125 radioactive seed, the papilloma at site of dumbbell shaped biopsy marking clip was localized using a medial to lateral approach. The follow-up mammogram images confirm the seed in the expected location and were marked for Dr. [place] Stage. Follow-up survey of the patient confirms presence of the radioactive seed. Order number of I-125 seed:  [id number]. Total activity:  2.878 millicuries reference Date: 03/30/2019 SITE 2: 2 O'CLOCK LEFT BREAST:  MALIGNANCY/RIBBON CLIP: Using mammographic guidance, sterile technique, 1% lidocaine and an I-125 radioactive seed, the malignancy with associated ribbon shaped biopsy marking clip was localized using a lateral to medial approach. The follow-up mammogram images confirm the seed in the expected location and were marked for Dr. [place] Stage. Follow-up survey of the patient confirms presence of the radioactive seed. Order number of I-125 seed:  [id number]. Total activity:  0.962 millicuries reference Date: 03/19/2019 The patient tolerated the procedure well and was released from the [facility]. She was given instructions regarding seed removal. IMPRESSION: Radioactive seed localization of malignancy in the upper-outer left breast and palpable in the slightly inner left breast. Electronically Signed   By: Everlean Alstrom M.D.   On: 04/08/2019 11:57       IMPRESSION/PLAN: 1. 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 Dr. Lisbeth Renshaw discusses the results of surgery and the patient's course since her last visit. She would benefit from external radiotherapy to the breast while she continuesHER2 treatment, and he course will be followed by antiestrogen therapy. We discussed the risks, benefits, short, and long term effects of radiotherapy, and the patient is interested in proceeding with radiotherapy at the appropriate interval. Dr. Lisbeth Renshaw discusses the delivery and logistics of radiotherapy and anticipates a course of 6 1/2 weeks of radiotherapy with deep inspiration breath hold technique. She is in agreement to proceed and will be set up to proceed with simulation next week to ensure her seroma is not reaccumulating.    This encounter was provided by telemedicine platform MyChart.  The patient has given verbal consent for this type of encounter and has been advised to only accept a meeting of this type in a secure network environment. The time spent during this encounter  was 25 minutes. The attendants for this meeting include Blenda Nicely, RN, Dr. Lisbeth Renshaw, Hayden Pedro  and [place].  During the encounter,  Blenda Nicely, RN, Dr. Lisbeth Renshaw, and Hayden Pedro were located at [facility] Radiation Oncology Department.  [place] was located at home.    The above documentation reflects my direct findings during this shared patient visit. Please see the separate note by Dr. Lisbeth Renshaw on this date for the remainder of the patient's plan of care.    Carola Rhine, PAC

## 2019-04-28 ENCOUNTER — Encounter: Payer: Self-pay | Admitting: Adult Health

## 2019-04-28 ENCOUNTER — Telehealth: Payer: Self-pay | Admitting: Adult Health

## 2019-04-28 NOTE — Telephone Encounter (Signed)
I talk with patient regarding schedule  

## 2019-05-03 ENCOUNTER — Telehealth: Payer: Self-pay | Admitting: *Deleted

## 2019-05-03 NOTE — Telephone Encounter (Signed)
Records faxed to [facility] - att Drema Pry - Release [id number]

## 2019-05-04 ENCOUNTER — Other Ambulatory Visit (HOSPITAL_COMMUNITY): Payer: 59

## 2019-05-05 ENCOUNTER — Ambulatory Visit
Admission: RE | Admit: 2019-05-05 | Discharge: 2019-05-05 | Disposition: A | Discharge status: Home / Self Care | Payer: 59 | Source: Ambulatory Visit | Attending: Radiation Oncology | Admitting: Radiation Oncology

## 2019-05-05 ENCOUNTER — Other Ambulatory Visit: Payer: Self-pay

## 2019-05-05 ENCOUNTER — Ambulatory Visit (HOSPITAL_COMMUNITY)
Admission: RE | Admit: 2019-05-05 | Discharge: 2019-05-05 | Disposition: A | Discharge status: Home / Self Care | Payer: 59 | Source: Ambulatory Visit | Attending: Adult Health | Admitting: Adult Health

## 2019-05-05 DIAGNOSIS — Z170 Estrogen receptor positive status [ER+]: Secondary | ICD-10-CM | POA: Insufficient documentation

## 2019-05-05 DIAGNOSIS — Z79899 Other long term (current) drug therapy: Secondary | ICD-10-CM | POA: Insufficient documentation

## 2019-05-05 DIAGNOSIS — C50412 Malignant neoplasm of upper-outer quadrant of left female breast: Secondary | ICD-10-CM | POA: Insufficient documentation

## 2019-05-05 DIAGNOSIS — Z510 Encounter for antineoplastic radiation therapy: Secondary | ICD-10-CM | POA: Insufficient documentation

## 2019-05-05 DIAGNOSIS — I340 Nonrheumatic mitral (valve) insufficiency: Secondary | ICD-10-CM | POA: Insufficient documentation

## 2019-05-05 NOTE — Progress Notes (Signed)
  Echocardiogram 2D Echocardiogram has been performed.  Natasha Huffman 05/05/2019, 9:43 AM

## 2019-05-06 NOTE — Progress Notes (Signed)
  Radiation Oncology         [phone] ________________________________  Name: Natasha Huffman MRN: [id number]  Date: 05/05/2019  DOB: 1960-12-23  DIAGNOSIS:     ICD-10-CM   1. Malignant neoplasm of upper-outer quadrant of left breast in female, estrogen receptor positive ([place])  C50.412    Z17.0      SIMULATION AND TREATMENT PLANNING NOTE  The patient presented for simulation prior to beginning her course of radiation treatment for her diagnosis of left-sided breast cancer. The patient was placed in a supine position on a breast board. A customized vac-lock bag was constructed and this complex treatment device will be used on a daily basis during her treatment. In this fashion, a CT scan was obtained through the chest area and an isocenter was placed near the chest wall within the breast.  The patient will be planned to receive a course of radiation initially to a dose of 50.4 Gy. This will consist of a whole breast radiotherapy technique. To accomplish this, 2 customized blocks have been designed which will correspond to medial and lateral whole breast tangent fields. This treatment will be accomplished at 1.8 Gy per fraction. A forward planning technique will also be evaluated to determine if this approach improves the plan. It is anticipated that the patient will then receive a 10 Gy boost to the seroma cavity which has been contoured. This will be accomplished at 2 Gy per fraction.   This initial treatment will consist of a 3-D conformal technique. The seroma has been contoured as the primary target structure. Additionally, dose volume histograms of both this target as well as the lungs and heart will also be evaluated. Such an approach is necessary to ensure that the target area is adequately covered while the nearby critical  normal structures are adequately spared.  Plan:  The final anticipated total dose therefore will correspond to 60.4 Gy.  Special treatment procedure was  performed today due to the extra time and effort required by myself to plan and prepare this patient for deep inspiration breath hold technique.  I have determined cardiac sparing to be of benefit to this patient to prevent long term cardiac damage due to radiation of the heart.  Bellows were placed on the patient's abdomen. To facilitate cardiac sparing, the patient was coached by the radiation therapists on breath hold techniques and breathing practice was performed. Practice waveforms were obtained. The patient was then scanned while maintaining breath hold in the treatment position.  This image was then transferred over to the imaging specialist. The imaging specialist then created a fusion of the free breathing and breath hold scans using the chest wall as the stable structure. I personally reviewed the fusion in axial, coronal and sagittal image planes.  Excellent cardiac sparing was obtained.  I felt the patient is an appropriate candidate for breath hold and the patient will be treated as such.  The image fusion was then reviewed with the patient to reinforce the necessity of reproducible breath hold.     _______________________________   Jodelle Gross, MD, PhD

## 2019-05-06 NOTE — Progress Notes (Signed)
  Radiation Oncology         [phone] ________________________________  Name: Natasha Huffman MRN: [id number]  Date: 05/05/2019  DOB: January 26, 1961  Optical Surface Tracking Plan:  Since intensity modulated radiotherapy (IMRT) and 3D conformal radiation treatment methods are predicated on accurate and precise positioning for treatment, intrafraction motion monitoring is medically necessary to ensure accurate and safe treatment delivery.  The ability to quantify intrafraction motion without excessive ionizing radiation dose can only be performed with optical surface tracking. Accordingly, surface imaging offers the opportunity to obtain 3D measurements of patient position throughout IMRT and 3D treatments without excessive radiation exposure.  I am ordering optical surface tracking for this patient's upcoming course of radiotherapy. ________________________________  Kyung Rudd, MD 05/06/2019 5:04 PM    Reference:   Particia Jasper, et al. Surface imaging-based analysis of intrafraction motion for breast radiotherapy patients.Journal of [place], n. 6, nov. 2014. ISSN [id number].   Available at: <http://www.jacmp.org/index.php/jacmp/article/view/4957>.

## 2019-05-07 ENCOUNTER — Encounter: Payer: Self-pay | Admitting: *Deleted

## 2019-05-10 DIAGNOSIS — Z1159 Encounter for screening for other viral diseases: Secondary | ICD-10-CM | POA: Diagnosis not present

## 2019-05-11 DIAGNOSIS — Z510 Encounter for antineoplastic radiation therapy: Secondary | ICD-10-CM | POA: Diagnosis not present

## 2019-05-11 DIAGNOSIS — C50412 Malignant neoplasm of upper-outer quadrant of left female breast: Secondary | ICD-10-CM | POA: Diagnosis not present

## 2019-05-11 DIAGNOSIS — Z170 Estrogen receptor positive status [ER+]: Secondary | ICD-10-CM | POA: Diagnosis not present

## 2019-05-12 ENCOUNTER — Other Ambulatory Visit: Payer: Self-pay

## 2019-05-12 ENCOUNTER — Ambulatory Visit
Admission: RE | Admit: 2019-05-12 | Discharge: 2019-05-12 | Disposition: A | Discharge status: Home / Self Care | Payer: 59 | Source: Ambulatory Visit | Attending: Radiation Oncology | Admitting: Radiation Oncology

## 2019-05-12 DIAGNOSIS — Z170 Estrogen receptor positive status [ER+]: Secondary | ICD-10-CM | POA: Diagnosis not present

## 2019-05-12 DIAGNOSIS — C50412 Malignant neoplasm of upper-outer quadrant of left female breast: Secondary | ICD-10-CM | POA: Diagnosis not present

## 2019-05-12 DIAGNOSIS — Z510 Encounter for antineoplastic radiation therapy: Secondary | ICD-10-CM | POA: Diagnosis not present

## 2019-05-12 MED ORDER — RADIAPLEXRX EX GEL
Freq: Once | CUTANEOUS | Status: AC
  Administered 2019-05-12: 11:00:00 via TOPICAL

## 2019-05-12 MED ORDER — ALRA NON-METALLIC DEODORANT (RAD-ONC)
1.0000 "application " | Freq: Once | TOPICAL | Status: AC
  Administered 2019-05-12: 1 via TOPICAL

## 2019-05-12 NOTE — Progress Notes (Signed)
Pt here for patient teaching.  Pt given Radiation and You booklet, skin care instructions, Alra deodorant and Radiaplex gel.  Reviewed areas of pertinence such as fatigue, hair loss, skin changes, breast tenderness and breast swelling . Pt able to give teach back of to pat skin and use unscented/gentle soap,apply Radiaplex bid, avoid applying anything to skin within 4 hours of treatment, avoid wearing an under wire bra and to use an electric razor if they must shave. Pt verbalizes understanding of information given and will contact nursing with any questions or concerns.     Natasha Huffman M. Tim Wilhide RN, BSN      

## 2019-05-13 ENCOUNTER — Other Ambulatory Visit: Payer: Self-pay

## 2019-05-13 ENCOUNTER — Ambulatory Visit
Admission: RE | Admit: 2019-05-13 | Discharge: 2019-05-13 | Disposition: A | Discharge status: Home / Self Care | Payer: 59 | Source: Ambulatory Visit | Attending: Radiation Oncology | Admitting: Radiation Oncology

## 2019-05-13 DIAGNOSIS — Z923 Personal history of irradiation: Secondary | ICD-10-CM | POA: Diagnosis not present

## 2019-05-13 DIAGNOSIS — K648 Other hemorrhoids: Secondary | ICD-10-CM | POA: Diagnosis not present

## 2019-05-13 DIAGNOSIS — Z170 Estrogen receptor positive status [ER+]: Secondary | ICD-10-CM | POA: Insufficient documentation

## 2019-05-13 DIAGNOSIS — Z23 Encounter for immunization: Secondary | ICD-10-CM | POA: Diagnosis not present

## 2019-05-13 DIAGNOSIS — Z8249 Family history of ischemic heart disease and other diseases of the circulatory system: Secondary | ICD-10-CM | POA: Diagnosis not present

## 2019-05-13 DIAGNOSIS — Z800 Family history of malignant neoplasm of digestive organs: Secondary | ICD-10-CM | POA: Diagnosis not present

## 2019-05-13 DIAGNOSIS — Z79899 Other long term (current) drug therapy: Secondary | ICD-10-CM | POA: Diagnosis not present

## 2019-05-13 DIAGNOSIS — Z86010 Personal history of colonic polyps: Secondary | ICD-10-CM | POA: Diagnosis not present

## 2019-05-13 DIAGNOSIS — Z794 Long term (current) use of insulin: Secondary | ICD-10-CM | POA: Diagnosis not present

## 2019-05-13 DIAGNOSIS — Z510 Encounter for antineoplastic radiation therapy: Secondary | ICD-10-CM | POA: Insufficient documentation

## 2019-05-13 DIAGNOSIS — C50412 Malignant neoplasm of upper-outer quadrant of left female breast: Secondary | ICD-10-CM | POA: Insufficient documentation

## 2019-05-13 DIAGNOSIS — K5730 Diverticulosis of large intestine without perforation or abscess without bleeding: Secondary | ICD-10-CM | POA: Diagnosis not present

## 2019-05-13 DIAGNOSIS — G629 Polyneuropathy, unspecified: Secondary | ICD-10-CM | POA: Diagnosis not present

## 2019-05-13 DIAGNOSIS — D122 Benign neoplasm of ascending colon: Secondary | ICD-10-CM | POA: Diagnosis not present

## 2019-05-13 DIAGNOSIS — D120 Benign neoplasm of cecum: Secondary | ICD-10-CM | POA: Diagnosis not present

## 2019-05-13 DIAGNOSIS — E109 Type 1 diabetes mellitus without complications: Secondary | ICD-10-CM | POA: Diagnosis not present

## 2019-05-13 DIAGNOSIS — Z791 Long term (current) use of non-steroidal anti-inflammatories (NSAID): Secondary | ICD-10-CM | POA: Diagnosis not present

## 2019-05-13 DIAGNOSIS — Z5112 Encounter for antineoplastic immunotherapy: Secondary | ICD-10-CM | POA: Diagnosis not present

## 2019-05-14 ENCOUNTER — Other Ambulatory Visit: Payer: Self-pay

## 2019-05-14 ENCOUNTER — Ambulatory Visit
Admission: RE | Admit: 2019-05-14 | Discharge: 2019-05-14 | Disposition: A | Discharge status: Home / Self Care | Payer: 59 | Source: Ambulatory Visit | Attending: Radiation Oncology | Admitting: Radiation Oncology

## 2019-05-14 DIAGNOSIS — Z170 Estrogen receptor positive status [ER+]: Secondary | ICD-10-CM | POA: Diagnosis not present

## 2019-05-14 DIAGNOSIS — C50412 Malignant neoplasm of upper-outer quadrant of left female breast: Secondary | ICD-10-CM | POA: Diagnosis not present

## 2019-05-14 DIAGNOSIS — Z794 Long term (current) use of insulin: Secondary | ICD-10-CM | POA: Diagnosis not present

## 2019-05-14 DIAGNOSIS — Z791 Long term (current) use of non-steroidal anti-inflammatories (NSAID): Secondary | ICD-10-CM | POA: Diagnosis not present

## 2019-05-14 DIAGNOSIS — G629 Polyneuropathy, unspecified: Secondary | ICD-10-CM | POA: Diagnosis not present

## 2019-05-14 DIAGNOSIS — Z5112 Encounter for antineoplastic immunotherapy: Secondary | ICD-10-CM | POA: Diagnosis not present

## 2019-05-14 DIAGNOSIS — E109 Type 1 diabetes mellitus without complications: Secondary | ICD-10-CM | POA: Diagnosis not present

## 2019-05-14 DIAGNOSIS — Z23 Encounter for immunization: Secondary | ICD-10-CM | POA: Diagnosis not present

## 2019-05-14 DIAGNOSIS — Z923 Personal history of irradiation: Secondary | ICD-10-CM | POA: Diagnosis not present

## 2019-05-17 ENCOUNTER — Other Ambulatory Visit: Payer: Self-pay

## 2019-05-17 ENCOUNTER — Ambulatory Visit
Admission: RE | Admit: 2019-05-17 | Discharge: 2019-05-17 | Disposition: A | Discharge status: Home / Self Care | Payer: 59 | Source: Ambulatory Visit | Attending: Radiation Oncology | Admitting: Radiation Oncology

## 2019-05-17 DIAGNOSIS — Z791 Long term (current) use of non-steroidal anti-inflammatories (NSAID): Secondary | ICD-10-CM | POA: Diagnosis not present

## 2019-05-17 DIAGNOSIS — Z5112 Encounter for antineoplastic immunotherapy: Secondary | ICD-10-CM | POA: Diagnosis not present

## 2019-05-17 DIAGNOSIS — C50412 Malignant neoplasm of upper-outer quadrant of left female breast: Secondary | ICD-10-CM | POA: Diagnosis not present

## 2019-05-17 DIAGNOSIS — Z923 Personal history of irradiation: Secondary | ICD-10-CM | POA: Diagnosis not present

## 2019-05-17 DIAGNOSIS — E109 Type 1 diabetes mellitus without complications: Secondary | ICD-10-CM | POA: Diagnosis not present

## 2019-05-17 DIAGNOSIS — G629 Polyneuropathy, unspecified: Secondary | ICD-10-CM | POA: Diagnosis not present

## 2019-05-17 DIAGNOSIS — Z794 Long term (current) use of insulin: Secondary | ICD-10-CM | POA: Diagnosis not present

## 2019-05-17 DIAGNOSIS — Z23 Encounter for immunization: Secondary | ICD-10-CM | POA: Diagnosis not present

## 2019-05-17 DIAGNOSIS — Z170 Estrogen receptor positive status [ER+]: Secondary | ICD-10-CM | POA: Diagnosis not present

## 2019-05-18 ENCOUNTER — Inpatient Hospital Stay: Payer: 59

## 2019-05-18 ENCOUNTER — Inpatient Hospital Stay: Payer: 59 | Attending: Oncology

## 2019-05-18 ENCOUNTER — Other Ambulatory Visit: Payer: Self-pay

## 2019-05-18 ENCOUNTER — Ambulatory Visit
Admission: RE | Admit: 2019-05-18 | Discharge: 2019-05-18 | Disposition: A | Discharge status: Home / Self Care | Payer: 59 | Source: Ambulatory Visit | Attending: Radiation Oncology | Admitting: Radiation Oncology

## 2019-05-18 VITALS — BP 124/78 | HR 82 | Temp 98.4°F | Resp 18 | Wt 198.0 lb

## 2019-05-18 DIAGNOSIS — Z791 Long term (current) use of non-steroidal anti-inflammatories (NSAID): Secondary | ICD-10-CM | POA: Insufficient documentation

## 2019-05-18 DIAGNOSIS — Z5112 Encounter for antineoplastic immunotherapy: Secondary | ICD-10-CM | POA: Insufficient documentation

## 2019-05-18 DIAGNOSIS — Z923 Personal history of irradiation: Secondary | ICD-10-CM | POA: Diagnosis not present

## 2019-05-18 DIAGNOSIS — C50412 Malignant neoplasm of upper-outer quadrant of left female breast: Secondary | ICD-10-CM | POA: Insufficient documentation

## 2019-05-18 DIAGNOSIS — Z95828 Presence of other vascular implants and grafts: Secondary | ICD-10-CM

## 2019-05-18 DIAGNOSIS — E109 Type 1 diabetes mellitus without complications: Secondary | ICD-10-CM | POA: Diagnosis not present

## 2019-05-18 DIAGNOSIS — Z8249 Family history of ischemic heart disease and other diseases of the circulatory system: Secondary | ICD-10-CM | POA: Insufficient documentation

## 2019-05-18 DIAGNOSIS — Z79899 Other long term (current) drug therapy: Secondary | ICD-10-CM | POA: Insufficient documentation

## 2019-05-18 DIAGNOSIS — Z23 Encounter for immunization: Secondary | ICD-10-CM | POA: Diagnosis not present

## 2019-05-18 DIAGNOSIS — G629 Polyneuropathy, unspecified: Secondary | ICD-10-CM | POA: Diagnosis not present

## 2019-05-18 DIAGNOSIS — Z170 Estrogen receptor positive status [ER+]: Secondary | ICD-10-CM | POA: Insufficient documentation

## 2019-05-18 DIAGNOSIS — Z794 Long term (current) use of insulin: Secondary | ICD-10-CM | POA: Diagnosis not present

## 2019-05-18 DIAGNOSIS — Z800 Family history of malignant neoplasm of digestive organs: Secondary | ICD-10-CM | POA: Insufficient documentation

## 2019-05-18 LAB — CBC WITH DIFFERENTIAL/PLATELET
Abs Immature Granulocytes: 0 10*3/uL (ref 0.00–0.07)
Basophils Absolute: 0 10*3/uL (ref 0.0–0.1)
Basophils Relative: 0 %
Eosinophils Absolute: 0.1 10*3/uL (ref 0.0–0.5)
Eosinophils Relative: 3 %
HCT: 36.5 % (ref 36.0–46.0)
Hemoglobin: 11.3 g/dL — ABNORMAL LOW (ref 12.0–15.0)
Immature Granulocytes: 0 %
Lymphocytes Relative: 32 %
Lymphs Abs: 1.3 10*3/uL (ref 0.7–4.0)
MCH: 26.7 pg (ref 26.0–34.0)
MCHC: 31 g/dL (ref 30.0–36.0)
MCV: 86.1 fL (ref 80.0–100.0)
Monocytes Absolute: 0.4 10*3/uL (ref 0.1–1.0)
Monocytes Relative: 9 %
Neutro Abs: 2.2 10*3/uL (ref 1.7–7.7)
Neutrophils Relative %: 56 %
Platelets: 234 10*3/uL (ref 150–400)
RBC: 4.24 MIL/uL (ref 3.87–5.11)
RDW: 12.8 % (ref 11.5–15.5)
WBC: 3.9 10*3/uL — ABNORMAL LOW (ref 4.0–10.5)
nRBC: 0 % (ref 0.0–0.2)

## 2019-05-18 LAB — COMPREHENSIVE METABOLIC PANEL
ALT: 19 U/L (ref 0–44)
AST: 16 U/L (ref 15–41)
Albumin: 4 g/dL (ref 3.5–5.0)
Alkaline Phosphatase: 122 U/L (ref 38–126)
Anion gap: 9 (ref 5–15)
BUN: 13 mg/dL (ref 6–20)
CO2: 26 mmol/L (ref 22–32)
Calcium: 9.1 mg/dL (ref 8.9–10.3)
Chloride: 107 mmol/L (ref 98–111)
Creatinine, Ser: 0.83 mg/dL (ref 0.44–1.00)
GFR calc Af Amer: >60 mL/min (ref >60)
GFR calc non Af Amer: >60 mL/min (ref >60)
Glucose, Bld: 105 mg/dL — ABNORMAL HIGH (ref 70–99)
Potassium: 4.4 mmol/L (ref 3.5–5.1)
Sodium: 142 mmol/L (ref 135–145)
Total Bilirubin: 0.5 mg/dL (ref 0.3–1.2)
Total Protein: 7.2 g/dL (ref 6.5–8.1)

## 2019-05-18 MED ORDER — ACETAMINOPHEN 325 MG PO TABS
650.0000 mg | ORAL_TABLET | Freq: Once | ORAL | Status: AC
  Administered 2019-05-18: 10:00:00 650 mg via ORAL

## 2019-05-18 MED ORDER — HEPARIN SOD (PORK) LOCK FLUSH 100 UNIT/ML IV SOLN
500.0000 [IU] | Freq: Once | INTRAVENOUS | Status: AC | PRN
  Administered 2019-05-18: 15:00:00 500 [IU]
  Filled 2019-05-18: qty 5

## 2019-05-18 MED ORDER — DIPHENHYDRAMINE HCL 25 MG PO CAPS
ORAL_CAPSULE | ORAL | Status: AC
  Filled 2019-05-18: qty 1

## 2019-05-18 MED ORDER — INFLUENZA VAC SPLIT QUAD 0.5 ML IM SUSY
PREFILLED_SYRINGE | INTRAMUSCULAR | Status: AC
  Filled 2019-05-18: qty 0.5

## 2019-05-18 MED ORDER — DIPHENHYDRAMINE HCL 25 MG PO CAPS
25.0000 mg | ORAL_CAPSULE | Freq: Once | ORAL | Status: AC
  Administered 2019-05-18: 10:00:00 25 mg via ORAL

## 2019-05-18 MED ORDER — ACETAMINOPHEN 325 MG PO TABS
ORAL_TABLET | ORAL | Status: AC
  Filled 2019-05-18: qty 2

## 2019-05-18 MED ORDER — INFLUENZA VAC SPLIT QUAD 0.5 ML IM SUSY
0.5000 mL | PREFILLED_SYRINGE | Freq: Once | INTRAMUSCULAR | Status: AC
  Administered 2019-05-18: 0.5 mL via INTRAMUSCULAR

## 2019-05-18 MED ORDER — SODIUM CHLORIDE 0.9 % IV SOLN
Freq: Once | INTRAVENOUS | Status: AC
  Administered 2019-05-18: 10:00:00 via INTRAVENOUS
  Filled 2019-05-18: qty 250

## 2019-05-18 MED ORDER — SODIUM CHLORIDE 0.9 % IV SOLN
3.5000 mg/kg | Freq: Once | INTRAVENOUS | Status: AC
  Administered 2019-05-18: 11:00:00 320 mg via INTRAVENOUS
  Filled 2019-05-18: qty 16

## 2019-05-18 MED ORDER — SODIUM CHLORIDE 0.9% FLUSH
10.0000 mL | INTRAVENOUS | Status: DC | PRN
  Administered 2019-05-18: 10 mL
  Filled 2019-05-18: qty 10

## 2019-05-18 NOTE — Patient Instructions (Addendum)
Ado-Trastuzumab Emtansine for injection What is this medicine? ADO-TRASTUZUMAB EMTANSINE (ADD oh traz TOO zuh mab em TAN zine) is a monoclonal antibody combined with chemotherapy. It is used to treat breast cancer. This medicine may be used for other purposes; ask your health care provider or pharmacist if you have questions. COMMON BRAND NAME(S): Kadcyla What should I tell my health care provider before I take this medicine? They need to know if you have any of these conditions:  heart disease  heart failure  infection (especially a virus infection such as chickenpox, cold sores, or herpes)  liver disease  lung or breathing disease, like asthma  tingling of the fingers or toes, or other nerve disorder  an unusual or allergic reaction to ado-trastuzumab emtansine, other medications, foods, dyes, or preservatives  pregnant or trying to get pregnant  breast-feeding How should I use this medicine? This medicine is for infusion into a vein. It is given by a health care professional in a hospital or clinic setting. Talk to your pediatrician regarding the use of this medicine in children. Special care may be needed. Overdosage: If you think you have taken too much of this medicine contact a poison control center or emergency room at once. NOTE: This medicine is only for you. Do not share this medicine with others. What if I miss a dose? It is important not to miss your dose. Call your doctor or health care professional if you are unable to keep an appointment. What may interact with this medicine? This medicine may also interact with the following medications:  atazanavir  boceprevir  clarithromycin  delavirdine  indinavir  dalfopristin; quinupristin  isoniazid, INH  itraconazole  ketoconazole  nefazodone  nelfinavir  ritonavir  telaprevir  telithromycin  tipranavir  voriconazole This list may not describe all possible interactions. Give your health care  provider a list of all the medicines, herbs, non-prescription drugs, or dietary supplements you use. Also tell them if you smoke, drink alcohol, or use illegal drugs. Some items may interact with your medicine. What should I watch for while using this medicine? Visit your doctor for checks on your progress. This drug may make you feel generally unwell. This is not uncommon, as chemotherapy can affect healthy cells as well as cancer cells. Report any side effects. Continue your course of treatment even though you feel ill unless your doctor tells you to stop. You may need blood work done while you are taking this medicine. Call your doctor or health care professional for advice if you get a fever, chills or sore throat, or other symptoms of a cold or flu. Do not treat yourself. This drug decreases your body's ability to fight infections. Try to avoid being around people who are sick. Be careful brushing and flossing your teeth or using a toothpick because you may get an infection or bleed more easily. If you have any dental work done, tell your dentist you are receiving this medicine. Avoid taking products that contain aspirin, acetaminophen, ibuprofen, naproxen, or ketoprofen unless instructed by your doctor. These medicines may hide a fever. Do not become pregnant while taking this medicine or for 7 months after stopping it, men with female partners should use contraception during treatment and for 4 months after the last dose. Women should inform their doctor if they wish to become pregnant or think they might be pregnant. There is a potential for serious side effects to an unborn child. Do not breast-feed an infant while taking this medicine or   for 7 months after the last dose. Men who have a partner who is pregnant or who is capable of becoming pregnant should use a condom during sexual activity while taking this medicine and for 4 months after stopping it. Men should inform their doctors if they wish to  father a child. This medicine may lower sperm counts. Talk to your health care professional or pharmacist for more information. What side effects may I notice from receiving this medicine? Side effects that you should report to your doctor or health care professional as soon as possible:  allergic reactions like skin rash, itching or hives, swelling of the face, lips, or tongue  breathing problems  chest pain or palpitations  fever or chills, sore throat  general ill feeling or flu-like symptoms  light-colored stools  nausea, vomiting  pain, tingling, numbness in the hands or feet  signs and symptoms of bleeding such as bloody or black, tarry stools; red or dark-brown urine; spitting up blood or brown material that looks like coffee grounds; red spots on the skin; unusual bruising or bleeding from the eye, gums, or nose  swelling of the legs or ankles  yellowing of the eyes or skin Side effects that usually do not require medical attention (report to your doctor or health care professional if they continue or are bothersome):  changes in taste  constipation  dizziness  headache  joint pain  muscle pain  trouble sleeping  unusually weak or tired This list may not describe all possible side effects. Call your doctor for medical advice about side effects. You may report side effects to FDA at 1-800-FDA-1088. Where should I keep my medicine? This drug is given in a hospital or clinic and will not be stored at home. NOTE: This sheet is a summary. It may not cover all possible information. If you have questions about this medicine, talk to your doctor, pharmacist, or health care provider.  2020 Elsevier/Gold Standard (2017-12-26 10:03:15)  

## 2019-05-19 ENCOUNTER — Other Ambulatory Visit: Payer: Self-pay

## 2019-05-19 ENCOUNTER — Ambulatory Visit
Admission: RE | Admit: 2019-05-19 | Discharge: 2019-05-19 | Disposition: A | Discharge status: Home / Self Care | Payer: 59 | Source: Ambulatory Visit | Attending: Radiation Oncology | Admitting: Radiation Oncology

## 2019-05-19 ENCOUNTER — Telehealth: Payer: Self-pay | Admitting: *Deleted

## 2019-05-19 DIAGNOSIS — G629 Polyneuropathy, unspecified: Secondary | ICD-10-CM | POA: Diagnosis not present

## 2019-05-19 DIAGNOSIS — Z791 Long term (current) use of non-steroidal anti-inflammatories (NSAID): Secondary | ICD-10-CM | POA: Diagnosis not present

## 2019-05-19 DIAGNOSIS — Z170 Estrogen receptor positive status [ER+]: Secondary | ICD-10-CM | POA: Diagnosis not present

## 2019-05-19 DIAGNOSIS — Z5112 Encounter for antineoplastic immunotherapy: Secondary | ICD-10-CM | POA: Diagnosis not present

## 2019-05-19 DIAGNOSIS — Z794 Long term (current) use of insulin: Secondary | ICD-10-CM | POA: Diagnosis not present

## 2019-05-19 DIAGNOSIS — E109 Type 1 diabetes mellitus without complications: Secondary | ICD-10-CM | POA: Diagnosis not present

## 2019-05-19 DIAGNOSIS — C50412 Malignant neoplasm of upper-outer quadrant of left female breast: Secondary | ICD-10-CM | POA: Diagnosis not present

## 2019-05-19 DIAGNOSIS — Z923 Personal history of irradiation: Secondary | ICD-10-CM | POA: Diagnosis not present

## 2019-05-19 DIAGNOSIS — Z23 Encounter for immunization: Secondary | ICD-10-CM | POA: Diagnosis not present

## 2019-05-20 ENCOUNTER — Ambulatory Visit
Admission: RE | Admit: 2019-05-20 | Discharge: 2019-05-20 | Disposition: A | Discharge status: Home / Self Care | Payer: 59 | Source: Ambulatory Visit | Attending: Radiation Oncology | Admitting: Radiation Oncology

## 2019-05-20 ENCOUNTER — Other Ambulatory Visit: Payer: Self-pay

## 2019-05-20 DIAGNOSIS — Z791 Long term (current) use of non-steroidal anti-inflammatories (NSAID): Secondary | ICD-10-CM | POA: Diagnosis not present

## 2019-05-20 DIAGNOSIS — C50412 Malignant neoplasm of upper-outer quadrant of left female breast: Secondary | ICD-10-CM | POA: Diagnosis not present

## 2019-05-20 DIAGNOSIS — E109 Type 1 diabetes mellitus without complications: Secondary | ICD-10-CM | POA: Diagnosis not present

## 2019-05-20 DIAGNOSIS — Z170 Estrogen receptor positive status [ER+]: Secondary | ICD-10-CM | POA: Diagnosis not present

## 2019-05-20 DIAGNOSIS — Z23 Encounter for immunization: Secondary | ICD-10-CM | POA: Diagnosis not present

## 2019-05-20 DIAGNOSIS — Z923 Personal history of irradiation: Secondary | ICD-10-CM | POA: Diagnosis not present

## 2019-05-20 DIAGNOSIS — Z794 Long term (current) use of insulin: Secondary | ICD-10-CM | POA: Diagnosis not present

## 2019-05-20 DIAGNOSIS — Z5112 Encounter for antineoplastic immunotherapy: Secondary | ICD-10-CM | POA: Diagnosis not present

## 2019-05-20 DIAGNOSIS — G629 Polyneuropathy, unspecified: Secondary | ICD-10-CM | POA: Diagnosis not present

## 2019-05-21 ENCOUNTER — Ambulatory Visit
Admission: RE | Admit: 2019-05-21 | Discharge: 2019-05-21 | Disposition: A | Discharge status: Home / Self Care | Payer: 59 | Source: Ambulatory Visit | Attending: Radiation Oncology | Admitting: Radiation Oncology

## 2019-05-21 ENCOUNTER — Other Ambulatory Visit: Payer: Self-pay

## 2019-05-21 DIAGNOSIS — Z23 Encounter for immunization: Secondary | ICD-10-CM | POA: Diagnosis not present

## 2019-05-21 DIAGNOSIS — Z170 Estrogen receptor positive status [ER+]: Secondary | ICD-10-CM | POA: Diagnosis not present

## 2019-05-21 DIAGNOSIS — Z5112 Encounter for antineoplastic immunotherapy: Secondary | ICD-10-CM | POA: Diagnosis not present

## 2019-05-21 DIAGNOSIS — Z923 Personal history of irradiation: Secondary | ICD-10-CM | POA: Diagnosis not present

## 2019-05-21 DIAGNOSIS — G629 Polyneuropathy, unspecified: Secondary | ICD-10-CM | POA: Diagnosis not present

## 2019-05-21 DIAGNOSIS — C50412 Malignant neoplasm of upper-outer quadrant of left female breast: Secondary | ICD-10-CM | POA: Diagnosis not present

## 2019-05-21 DIAGNOSIS — Z791 Long term (current) use of non-steroidal anti-inflammatories (NSAID): Secondary | ICD-10-CM | POA: Diagnosis not present

## 2019-05-21 DIAGNOSIS — Z794 Long term (current) use of insulin: Secondary | ICD-10-CM | POA: Diagnosis not present

## 2019-05-21 DIAGNOSIS — E109 Type 1 diabetes mellitus without complications: Secondary | ICD-10-CM | POA: Diagnosis not present

## 2019-05-24 ENCOUNTER — Ambulatory Visit
Admission: RE | Admit: 2019-05-24 | Discharge: 2019-05-24 | Disposition: A | Discharge status: Home / Self Care | Payer: 59 | Source: Ambulatory Visit | Attending: Radiation Oncology | Admitting: Radiation Oncology

## 2019-05-24 ENCOUNTER — Other Ambulatory Visit: Payer: Self-pay

## 2019-05-24 DIAGNOSIS — Z5112 Encounter for antineoplastic immunotherapy: Secondary | ICD-10-CM | POA: Diagnosis not present

## 2019-05-24 DIAGNOSIS — Z23 Encounter for immunization: Secondary | ICD-10-CM | POA: Diagnosis not present

## 2019-05-24 DIAGNOSIS — Z170 Estrogen receptor positive status [ER+]: Secondary | ICD-10-CM | POA: Diagnosis not present

## 2019-05-24 DIAGNOSIS — E109 Type 1 diabetes mellitus without complications: Secondary | ICD-10-CM | POA: Diagnosis not present

## 2019-05-24 DIAGNOSIS — C50412 Malignant neoplasm of upper-outer quadrant of left female breast: Secondary | ICD-10-CM | POA: Diagnosis not present

## 2019-05-24 DIAGNOSIS — Z791 Long term (current) use of non-steroidal anti-inflammatories (NSAID): Secondary | ICD-10-CM | POA: Diagnosis not present

## 2019-05-24 DIAGNOSIS — Z923 Personal history of irradiation: Secondary | ICD-10-CM | POA: Diagnosis not present

## 2019-05-24 DIAGNOSIS — G629 Polyneuropathy, unspecified: Secondary | ICD-10-CM | POA: Diagnosis not present

## 2019-05-24 DIAGNOSIS — Z794 Long term (current) use of insulin: Secondary | ICD-10-CM | POA: Diagnosis not present

## 2019-05-25 ENCOUNTER — Other Ambulatory Visit: Payer: Self-pay

## 2019-05-25 ENCOUNTER — Ambulatory Visit
Admission: RE | Admit: 2019-05-25 | Discharge: 2019-05-25 | Disposition: A | Discharge status: Home / Self Care | Payer: 59 | Source: Ambulatory Visit | Attending: Radiation Oncology | Admitting: Radiation Oncology

## 2019-05-25 DIAGNOSIS — Z794 Long term (current) use of insulin: Secondary | ICD-10-CM | POA: Diagnosis not present

## 2019-05-25 DIAGNOSIS — Z23 Encounter for immunization: Secondary | ICD-10-CM | POA: Diagnosis not present

## 2019-05-25 DIAGNOSIS — E109 Type 1 diabetes mellitus without complications: Secondary | ICD-10-CM | POA: Diagnosis not present

## 2019-05-25 DIAGNOSIS — C50412 Malignant neoplasm of upper-outer quadrant of left female breast: Secondary | ICD-10-CM | POA: Diagnosis not present

## 2019-05-25 DIAGNOSIS — Z170 Estrogen receptor positive status [ER+]: Secondary | ICD-10-CM | POA: Diagnosis not present

## 2019-05-25 DIAGNOSIS — Z791 Long term (current) use of non-steroidal anti-inflammatories (NSAID): Secondary | ICD-10-CM | POA: Diagnosis not present

## 2019-05-25 DIAGNOSIS — G629 Polyneuropathy, unspecified: Secondary | ICD-10-CM | POA: Diagnosis not present

## 2019-05-25 DIAGNOSIS — Z5112 Encounter for antineoplastic immunotherapy: Secondary | ICD-10-CM | POA: Diagnosis not present

## 2019-05-25 DIAGNOSIS — Z923 Personal history of irradiation: Secondary | ICD-10-CM | POA: Diagnosis not present

## 2019-05-26 ENCOUNTER — Other Ambulatory Visit: Payer: Self-pay

## 2019-05-26 ENCOUNTER — Ambulatory Visit
Admission: RE | Admit: 2019-05-26 | Discharge: 2019-05-26 | Disposition: A | Discharge status: Home / Self Care | Payer: 59 | Source: Ambulatory Visit | Attending: Radiation Oncology | Admitting: Radiation Oncology

## 2019-05-26 DIAGNOSIS — Z923 Personal history of irradiation: Secondary | ICD-10-CM | POA: Diagnosis not present

## 2019-05-26 DIAGNOSIS — Z5112 Encounter for antineoplastic immunotherapy: Secondary | ICD-10-CM | POA: Diagnosis not present

## 2019-05-26 DIAGNOSIS — G629 Polyneuropathy, unspecified: Secondary | ICD-10-CM | POA: Diagnosis not present

## 2019-05-26 DIAGNOSIS — Z791 Long term (current) use of non-steroidal anti-inflammatories (NSAID): Secondary | ICD-10-CM | POA: Diagnosis not present

## 2019-05-26 DIAGNOSIS — Z170 Estrogen receptor positive status [ER+]: Secondary | ICD-10-CM | POA: Diagnosis not present

## 2019-05-26 DIAGNOSIS — Z794 Long term (current) use of insulin: Secondary | ICD-10-CM | POA: Diagnosis not present

## 2019-05-26 DIAGNOSIS — E109 Type 1 diabetes mellitus without complications: Secondary | ICD-10-CM | POA: Diagnosis not present

## 2019-05-26 DIAGNOSIS — Z23 Encounter for immunization: Secondary | ICD-10-CM | POA: Diagnosis not present

## 2019-05-26 DIAGNOSIS — C50412 Malignant neoplasm of upper-outer quadrant of left female breast: Secondary | ICD-10-CM | POA: Diagnosis not present

## 2019-05-27 ENCOUNTER — Ambulatory Visit
Admission: RE | Admit: 2019-05-27 | Discharge: 2019-05-27 | Disposition: A | Discharge status: Home / Self Care | Payer: 59 | Source: Ambulatory Visit | Attending: Radiation Oncology | Admitting: Radiation Oncology

## 2019-05-27 ENCOUNTER — Other Ambulatory Visit: Payer: Self-pay

## 2019-05-27 DIAGNOSIS — E109 Type 1 diabetes mellitus without complications: Secondary | ICD-10-CM | POA: Diagnosis not present

## 2019-05-27 DIAGNOSIS — Z791 Long term (current) use of non-steroidal anti-inflammatories (NSAID): Secondary | ICD-10-CM | POA: Diagnosis not present

## 2019-05-27 DIAGNOSIS — Z170 Estrogen receptor positive status [ER+]: Secondary | ICD-10-CM | POA: Diagnosis not present

## 2019-05-27 DIAGNOSIS — G629 Polyneuropathy, unspecified: Secondary | ICD-10-CM | POA: Diagnosis not present

## 2019-05-27 DIAGNOSIS — C50412 Malignant neoplasm of upper-outer quadrant of left female breast: Secondary | ICD-10-CM | POA: Diagnosis not present

## 2019-05-27 DIAGNOSIS — Z923 Personal history of irradiation: Secondary | ICD-10-CM | POA: Diagnosis not present

## 2019-05-27 DIAGNOSIS — Z23 Encounter for immunization: Secondary | ICD-10-CM | POA: Diagnosis not present

## 2019-05-27 DIAGNOSIS — Z5112 Encounter for antineoplastic immunotherapy: Secondary | ICD-10-CM | POA: Diagnosis not present

## 2019-05-27 DIAGNOSIS — Z794 Long term (current) use of insulin: Secondary | ICD-10-CM | POA: Diagnosis not present

## 2019-05-28 ENCOUNTER — Ambulatory Visit
Admission: RE | Admit: 2019-05-28 | Discharge: 2019-05-28 | Disposition: A | Discharge status: Home / Self Care | Payer: 59 | Source: Ambulatory Visit | Attending: Radiation Oncology | Admitting: Radiation Oncology

## 2019-05-28 ENCOUNTER — Other Ambulatory Visit: Payer: Self-pay

## 2019-05-28 DIAGNOSIS — Z794 Long term (current) use of insulin: Secondary | ICD-10-CM | POA: Diagnosis not present

## 2019-05-28 DIAGNOSIS — Z170 Estrogen receptor positive status [ER+]: Secondary | ICD-10-CM | POA: Diagnosis not present

## 2019-05-28 DIAGNOSIS — Z23 Encounter for immunization: Secondary | ICD-10-CM | POA: Diagnosis not present

## 2019-05-28 DIAGNOSIS — G629 Polyneuropathy, unspecified: Secondary | ICD-10-CM | POA: Diagnosis not present

## 2019-05-28 DIAGNOSIS — Z923 Personal history of irradiation: Secondary | ICD-10-CM | POA: Diagnosis not present

## 2019-05-28 DIAGNOSIS — E109 Type 1 diabetes mellitus without complications: Secondary | ICD-10-CM | POA: Diagnosis not present

## 2019-05-28 DIAGNOSIS — Z791 Long term (current) use of non-steroidal anti-inflammatories (NSAID): Secondary | ICD-10-CM | POA: Diagnosis not present

## 2019-05-28 DIAGNOSIS — C50412 Malignant neoplasm of upper-outer quadrant of left female breast: Secondary | ICD-10-CM | POA: Diagnosis not present

## 2019-05-28 DIAGNOSIS — Z5112 Encounter for antineoplastic immunotherapy: Secondary | ICD-10-CM | POA: Diagnosis not present

## 2019-05-31 ENCOUNTER — Other Ambulatory Visit: Payer: Self-pay

## 2019-05-31 ENCOUNTER — Ambulatory Visit
Admission: RE | Admit: 2019-05-31 | Discharge: 2019-05-31 | Disposition: A | Discharge status: Home / Self Care | Payer: 59 | Source: Ambulatory Visit | Attending: Radiation Oncology | Admitting: Radiation Oncology

## 2019-05-31 DIAGNOSIS — C50412 Malignant neoplasm of upper-outer quadrant of left female breast: Secondary | ICD-10-CM | POA: Diagnosis not present

## 2019-05-31 DIAGNOSIS — G629 Polyneuropathy, unspecified: Secondary | ICD-10-CM | POA: Diagnosis not present

## 2019-05-31 DIAGNOSIS — Z923 Personal history of irradiation: Secondary | ICD-10-CM | POA: Diagnosis not present

## 2019-05-31 DIAGNOSIS — Z5112 Encounter for antineoplastic immunotherapy: Secondary | ICD-10-CM | POA: Diagnosis not present

## 2019-05-31 DIAGNOSIS — Z23 Encounter for immunization: Secondary | ICD-10-CM | POA: Diagnosis not present

## 2019-05-31 DIAGNOSIS — Z791 Long term (current) use of non-steroidal anti-inflammatories (NSAID): Secondary | ICD-10-CM | POA: Diagnosis not present

## 2019-05-31 DIAGNOSIS — Z794 Long term (current) use of insulin: Secondary | ICD-10-CM | POA: Diagnosis not present

## 2019-05-31 DIAGNOSIS — E109 Type 1 diabetes mellitus without complications: Secondary | ICD-10-CM | POA: Diagnosis not present

## 2019-05-31 DIAGNOSIS — Z170 Estrogen receptor positive status [ER+]: Secondary | ICD-10-CM | POA: Diagnosis not present

## 2019-06-01 ENCOUNTER — Other Ambulatory Visit: Payer: Self-pay

## 2019-06-01 ENCOUNTER — Ambulatory Visit
Admission: RE | Admit: 2019-06-01 | Discharge: 2019-06-01 | Disposition: A | Discharge status: Home / Self Care | Payer: 59 | Source: Ambulatory Visit | Attending: Radiation Oncology | Admitting: Radiation Oncology

## 2019-06-01 DIAGNOSIS — Z923 Personal history of irradiation: Secondary | ICD-10-CM | POA: Diagnosis not present

## 2019-06-01 DIAGNOSIS — Z5112 Encounter for antineoplastic immunotherapy: Secondary | ICD-10-CM | POA: Diagnosis not present

## 2019-06-01 DIAGNOSIS — C50412 Malignant neoplasm of upper-outer quadrant of left female breast: Secondary | ICD-10-CM | POA: Diagnosis not present

## 2019-06-01 DIAGNOSIS — Z23 Encounter for immunization: Secondary | ICD-10-CM | POA: Diagnosis not present

## 2019-06-01 DIAGNOSIS — Z170 Estrogen receptor positive status [ER+]: Secondary | ICD-10-CM | POA: Diagnosis not present

## 2019-06-01 DIAGNOSIS — Z791 Long term (current) use of non-steroidal anti-inflammatories (NSAID): Secondary | ICD-10-CM | POA: Diagnosis not present

## 2019-06-01 DIAGNOSIS — E109 Type 1 diabetes mellitus without complications: Secondary | ICD-10-CM | POA: Diagnosis not present

## 2019-06-01 DIAGNOSIS — Z794 Long term (current) use of insulin: Secondary | ICD-10-CM | POA: Diagnosis not present

## 2019-06-01 DIAGNOSIS — G629 Polyneuropathy, unspecified: Secondary | ICD-10-CM | POA: Diagnosis not present

## 2019-06-02 ENCOUNTER — Ambulatory Visit
Admission: RE | Admit: 2019-06-02 | Discharge: 2019-06-02 | Disposition: A | Discharge status: Home / Self Care | Payer: 59 | Source: Ambulatory Visit | Attending: Radiation Oncology | Admitting: Radiation Oncology

## 2019-06-02 ENCOUNTER — Other Ambulatory Visit: Payer: Self-pay

## 2019-06-02 DIAGNOSIS — Z5112 Encounter for antineoplastic immunotherapy: Secondary | ICD-10-CM | POA: Diagnosis not present

## 2019-06-02 DIAGNOSIS — Z923 Personal history of irradiation: Secondary | ICD-10-CM | POA: Diagnosis not present

## 2019-06-02 DIAGNOSIS — G629 Polyneuropathy, unspecified: Secondary | ICD-10-CM | POA: Diagnosis not present

## 2019-06-02 DIAGNOSIS — Z794 Long term (current) use of insulin: Secondary | ICD-10-CM | POA: Diagnosis not present

## 2019-06-02 DIAGNOSIS — C50412 Malignant neoplasm of upper-outer quadrant of left female breast: Secondary | ICD-10-CM | POA: Diagnosis not present

## 2019-06-02 DIAGNOSIS — Z791 Long term (current) use of non-steroidal anti-inflammatories (NSAID): Secondary | ICD-10-CM | POA: Diagnosis not present

## 2019-06-02 DIAGNOSIS — Z23 Encounter for immunization: Secondary | ICD-10-CM | POA: Diagnosis not present

## 2019-06-02 DIAGNOSIS — E109 Type 1 diabetes mellitus without complications: Secondary | ICD-10-CM | POA: Diagnosis not present

## 2019-06-02 DIAGNOSIS — Z170 Estrogen receptor positive status [ER+]: Secondary | ICD-10-CM | POA: Diagnosis not present

## 2019-06-03 ENCOUNTER — Ambulatory Visit
Admission: RE | Admit: 2019-06-03 | Discharge: 2019-06-03 | Disposition: A | Discharge status: Home / Self Care | Payer: 59 | Source: Ambulatory Visit | Attending: Radiation Oncology | Admitting: Radiation Oncology

## 2019-06-03 ENCOUNTER — Other Ambulatory Visit: Payer: Self-pay

## 2019-06-03 DIAGNOSIS — Z170 Estrogen receptor positive status [ER+]: Secondary | ICD-10-CM | POA: Diagnosis not present

## 2019-06-03 DIAGNOSIS — Z23 Encounter for immunization: Secondary | ICD-10-CM | POA: Diagnosis not present

## 2019-06-03 DIAGNOSIS — Z794 Long term (current) use of insulin: Secondary | ICD-10-CM | POA: Diagnosis not present

## 2019-06-03 DIAGNOSIS — C50412 Malignant neoplasm of upper-outer quadrant of left female breast: Secondary | ICD-10-CM | POA: Diagnosis not present

## 2019-06-03 DIAGNOSIS — Z923 Personal history of irradiation: Secondary | ICD-10-CM | POA: Diagnosis not present

## 2019-06-03 DIAGNOSIS — E109 Type 1 diabetes mellitus without complications: Secondary | ICD-10-CM | POA: Diagnosis not present

## 2019-06-03 DIAGNOSIS — G629 Polyneuropathy, unspecified: Secondary | ICD-10-CM | POA: Diagnosis not present

## 2019-06-03 DIAGNOSIS — Z5112 Encounter for antineoplastic immunotherapy: Secondary | ICD-10-CM | POA: Diagnosis not present

## 2019-06-03 DIAGNOSIS — Z791 Long term (current) use of non-steroidal anti-inflammatories (NSAID): Secondary | ICD-10-CM | POA: Diagnosis not present

## 2019-06-04 ENCOUNTER — Other Ambulatory Visit: Payer: Self-pay

## 2019-06-04 ENCOUNTER — Ambulatory Visit
Admission: RE | Admit: 2019-06-04 | Discharge: 2019-06-04 | Disposition: A | Discharge status: Home / Self Care | Payer: 59 | Source: Ambulatory Visit | Attending: Radiation Oncology | Admitting: Radiation Oncology

## 2019-06-04 DIAGNOSIS — Z923 Personal history of irradiation: Secondary | ICD-10-CM | POA: Diagnosis not present

## 2019-06-04 DIAGNOSIS — Z791 Long term (current) use of non-steroidal anti-inflammatories (NSAID): Secondary | ICD-10-CM | POA: Diagnosis not present

## 2019-06-04 DIAGNOSIS — Z170 Estrogen receptor positive status [ER+]: Secondary | ICD-10-CM | POA: Diagnosis not present

## 2019-06-04 DIAGNOSIS — Z794 Long term (current) use of insulin: Secondary | ICD-10-CM | POA: Diagnosis not present

## 2019-06-04 DIAGNOSIS — G629 Polyneuropathy, unspecified: Secondary | ICD-10-CM | POA: Diagnosis not present

## 2019-06-04 DIAGNOSIS — Z5112 Encounter for antineoplastic immunotherapy: Secondary | ICD-10-CM | POA: Diagnosis not present

## 2019-06-04 DIAGNOSIS — C50412 Malignant neoplasm of upper-outer quadrant of left female breast: Secondary | ICD-10-CM | POA: Diagnosis not present

## 2019-06-04 DIAGNOSIS — Z23 Encounter for immunization: Secondary | ICD-10-CM | POA: Diagnosis not present

## 2019-06-04 DIAGNOSIS — E109 Type 1 diabetes mellitus without complications: Secondary | ICD-10-CM | POA: Diagnosis not present

## 2019-06-07 ENCOUNTER — Other Ambulatory Visit: Payer: Self-pay

## 2019-06-07 ENCOUNTER — Telehealth: Payer: Self-pay | Admitting: Adult Health

## 2019-06-07 ENCOUNTER — Ambulatory Visit
Admission: RE | Admit: 2019-06-07 | Discharge: 2019-06-07 | Disposition: A | Discharge status: Home / Self Care | Payer: 59 | Source: Ambulatory Visit | Attending: Radiation Oncology | Admitting: Radiation Oncology

## 2019-06-07 DIAGNOSIS — Z923 Personal history of irradiation: Secondary | ICD-10-CM | POA: Diagnosis not present

## 2019-06-07 DIAGNOSIS — Z791 Long term (current) use of non-steroidal anti-inflammatories (NSAID): Secondary | ICD-10-CM | POA: Diagnosis not present

## 2019-06-07 DIAGNOSIS — Z170 Estrogen receptor positive status [ER+]: Secondary | ICD-10-CM | POA: Diagnosis not present

## 2019-06-07 DIAGNOSIS — Z23 Encounter for immunization: Secondary | ICD-10-CM | POA: Diagnosis not present

## 2019-06-07 DIAGNOSIS — Z794 Long term (current) use of insulin: Secondary | ICD-10-CM | POA: Diagnosis not present

## 2019-06-07 DIAGNOSIS — C50412 Malignant neoplasm of upper-outer quadrant of left female breast: Secondary | ICD-10-CM | POA: Diagnosis not present

## 2019-06-07 DIAGNOSIS — G629 Polyneuropathy, unspecified: Secondary | ICD-10-CM | POA: Diagnosis not present

## 2019-06-07 DIAGNOSIS — Z5112 Encounter for antineoplastic immunotherapy: Secondary | ICD-10-CM | POA: Diagnosis not present

## 2019-06-07 DIAGNOSIS — E109 Type 1 diabetes mellitus without complications: Secondary | ICD-10-CM | POA: Diagnosis not present

## 2019-06-07 NOTE — Telephone Encounter (Signed)
Returned patient's phone call regarding rescheduling 10/27 appointments, time patient is requesting is not available. Patient will keep appointment as is.

## 2019-06-08 ENCOUNTER — Other Ambulatory Visit: Payer: Self-pay

## 2019-06-08 ENCOUNTER — Encounter: Payer: Self-pay | Admitting: Adult Health

## 2019-06-08 ENCOUNTER — Inpatient Hospital Stay: Payer: 59

## 2019-06-08 ENCOUNTER — Ambulatory Visit
Admission: RE | Admit: 2019-06-08 | Discharge: 2019-06-08 | Disposition: A | Discharge status: Home / Self Care | Payer: 59 | Source: Ambulatory Visit | Attending: Radiation Oncology | Admitting: Radiation Oncology

## 2019-06-08 ENCOUNTER — Ambulatory Visit: Payer: 59

## 2019-06-08 ENCOUNTER — Encounter: Payer: Self-pay | Admitting: Oncology

## 2019-06-08 ENCOUNTER — Inpatient Hospital Stay (HOSPITAL_BASED_OUTPATIENT_CLINIC_OR_DEPARTMENT_OTHER): Payer: 59 | Admitting: Adult Health

## 2019-06-08 VITALS — BP 132/80 | HR 87 | Temp 98.0°F | Resp 18 | Ht 62.0 in | Wt 204.2 lb

## 2019-06-08 DIAGNOSIS — Z95828 Presence of other vascular implants and grafts: Secondary | ICD-10-CM

## 2019-06-08 DIAGNOSIS — Z794 Long term (current) use of insulin: Secondary | ICD-10-CM | POA: Diagnosis not present

## 2019-06-08 DIAGNOSIS — Z791 Long term (current) use of non-steroidal anti-inflammatories (NSAID): Secondary | ICD-10-CM | POA: Diagnosis not present

## 2019-06-08 DIAGNOSIS — C50412 Malignant neoplasm of upper-outer quadrant of left female breast: Secondary | ICD-10-CM

## 2019-06-08 DIAGNOSIS — Z170 Estrogen receptor positive status [ER+]: Secondary | ICD-10-CM | POA: Diagnosis not present

## 2019-06-08 DIAGNOSIS — G629 Polyneuropathy, unspecified: Secondary | ICD-10-CM | POA: Diagnosis not present

## 2019-06-08 DIAGNOSIS — E109 Type 1 diabetes mellitus without complications: Secondary | ICD-10-CM | POA: Diagnosis not present

## 2019-06-08 DIAGNOSIS — Z923 Personal history of irradiation: Secondary | ICD-10-CM | POA: Diagnosis not present

## 2019-06-08 DIAGNOSIS — Z5112 Encounter for antineoplastic immunotherapy: Secondary | ICD-10-CM | POA: Diagnosis not present

## 2019-06-08 DIAGNOSIS — Z23 Encounter for immunization: Secondary | ICD-10-CM | POA: Diagnosis not present

## 2019-06-08 LAB — CBC WITH DIFFERENTIAL/PLATELET
Abs Immature Granulocytes: 0.01 10*3/uL (ref 0.00–0.07)
Basophils Absolute: 0 10*3/uL (ref 0.0–0.1)
Basophils Relative: 1 %
Eosinophils Absolute: 0.1 10*3/uL (ref 0.0–0.5)
Eosinophils Relative: 3 %
HCT: 34.3 % — ABNORMAL LOW (ref 36.0–46.0)
Hemoglobin: 10.9 g/dL — ABNORMAL LOW (ref 12.0–15.0)
Immature Granulocytes: 0 %
Lymphocytes Relative: 23 %
Lymphs Abs: 0.9 10*3/uL (ref 0.7–4.0)
MCH: 27.3 pg (ref 26.0–34.0)
MCHC: 31.8 g/dL (ref 30.0–36.0)
MCV: 85.8 fL (ref 80.0–100.0)
Monocytes Absolute: 0.4 10*3/uL (ref 0.1–1.0)
Monocytes Relative: 11 %
Neutro Abs: 2.5 10*3/uL (ref 1.7–7.7)
Neutrophils Relative %: 62 %
Platelets: 296 10*3/uL (ref 150–400)
RBC: 4 MIL/uL (ref 3.87–5.11)
RDW: 12.9 % (ref 11.5–15.5)
WBC: 4 10*3/uL (ref 4.0–10.5)
nRBC: 0 % (ref 0.0–0.2)

## 2019-06-08 LAB — COMPREHENSIVE METABOLIC PANEL
ALT: 42 U/L (ref 0–44)
AST: 31 U/L (ref 15–41)
Albumin: 3.7 g/dL (ref 3.5–5.0)
Alkaline Phosphatase: 120 U/L (ref 38–126)
Anion gap: 13 (ref 5–15)
BUN: 13 mg/dL (ref 6–20)
CO2: 25 mmol/L (ref 22–32)
Calcium: 8.9 mg/dL (ref 8.9–10.3)
Chloride: 107 mmol/L (ref 98–111)
Creatinine, Ser: 0.79 mg/dL (ref 0.44–1.00)
GFR calc Af Amer: >60 mL/min (ref >60)
GFR calc non Af Amer: >60 mL/min (ref >60)
Glucose, Bld: 106 mg/dL — ABNORMAL HIGH (ref 70–99)
Potassium: 3.9 mmol/L (ref 3.5–5.1)
Sodium: 145 mmol/L (ref 135–145)
Total Bilirubin: 0.3 mg/dL (ref 0.3–1.2)
Total Protein: 7.2 g/dL (ref 6.5–8.1)

## 2019-06-08 MED ORDER — SODIUM CHLORIDE 0.9% FLUSH
10.0000 mL | INTRAVENOUS | Status: DC | PRN
  Administered 2019-06-08: 10 mL
  Filled 2019-06-08: qty 10

## 2019-06-08 MED ORDER — DIPHENHYDRAMINE HCL 25 MG PO CAPS
ORAL_CAPSULE | ORAL | Status: AC
  Filled 2019-06-08: qty 1

## 2019-06-08 MED ORDER — ACETAMINOPHEN 325 MG PO TABS
650.0000 mg | ORAL_TABLET | Freq: Once | ORAL | Status: AC
  Administered 2019-06-08: 650 mg via ORAL

## 2019-06-08 MED ORDER — SODIUM CHLORIDE 0.9 % IV SOLN
Freq: Once | INTRAVENOUS | Status: AC
  Administered 2019-06-08: 13:00:00 via INTRAVENOUS
  Filled 2019-06-08: qty 250

## 2019-06-08 MED ORDER — HEPARIN SOD (PORK) LOCK FLUSH 100 UNIT/ML IV SOLN
500.0000 [IU] | Freq: Once | INTRAVENOUS | Status: AC | PRN
  Administered 2019-06-08: 500 [IU]
  Filled 2019-06-08: qty 5

## 2019-06-08 MED ORDER — DIPHENHYDRAMINE HCL 25 MG PO CAPS
25.0000 mg | ORAL_CAPSULE | Freq: Once | ORAL | Status: AC
  Administered 2019-06-08: 25 mg via ORAL

## 2019-06-08 MED ORDER — ACETAMINOPHEN 325 MG PO TABS
ORAL_TABLET | ORAL | Status: AC
  Filled 2019-06-08: qty 2

## 2019-06-08 MED ORDER — SODIUM CHLORIDE 0.9 % IV SOLN
3.5000 mg/kg | Freq: Once | INTRAVENOUS | Status: AC
  Administered 2019-06-08: 320 mg via INTRAVENOUS
  Filled 2019-06-08: qty 16

## 2019-06-08 MED ORDER — SODIUM CHLORIDE 0.9% FLUSH
10.0000 mL | INTRAVENOUS | Status: DC | PRN
  Administered 2019-06-08: 09:00:00 10 mL
  Filled 2019-06-08: qty 10

## 2019-06-08 NOTE — Progress Notes (Signed)
[name]  [phone] [phone]    ID: Natasha Huffman DOB: 01-08-1961  MR#: [id number]  EHU#:[id number]  Patient Care Team: [name], MD as PCP - General (Family Medicine) Rockwell Germany, RN as Oncology Nurse Navigator Mauro Kaufmann, RN as Oncology Nurse Navigator [name], MD as Consulting Physician (General Surgery) [name], [name], MD as Consulting Physician (Oncology) Kyung Rudd, MD as Consulting Physician (Radiation Oncology) Larey Dresser, MD as Consulting Physician (Cardiology) OTHER MD:    CHIEF COMPLAINT: Triple positive breast cancer  CURRENT TREATMENT: TDM-1, Adjuvant radiation.  INTERVAL HISTORY: Natasha Huffman returns today for follow-up and treatment of her HER-2 positive breast cancer.   She has started on TDM1 therapy with her first dose being on 05/18/2019.  She notes that she is tolerating it well with no effects.  She also began adjuvant radiation therapy.  She started this on 05/12/2019.  She notes this is going well other than some skin hyperpigmentation to the radiation fields.    REVIEW OF SYSTEMS: Natasha Huffman is minimally fatigued.  She is doing well, and is continuing to work from home.  She denies any fever, chills, chest pain, palpitations, cough, shortness of breath, bowel/bladder changes, nausea, vomiting, headaches, or any other concerns.  A detailed ROS was otherwise non contributory.     HISTORY OF CURRENT ILLNESS: From the original intake note:  Natasha Huffman had routine screening mammography on 09/23/2018 showing a possible abnormality in the left breast. She underwent unilateral left diagnostic mammography with tomography and left breast ultrasonography at [facility] on 09/23/2018 showing: Breast Density Category B. There is a mass with irregular margins in the upper-outer quadrant of the left breast. On physical exam, there is palpable focal thickening in the 2 o'clock location of the left  breast. Sonographically, an irregular hypoechoic mass with internal vascularity in the 2 o'clock location of the left breast 10 cm from the nipple is seen. The mass measures 1.8 x 1.1 x 1.0 cm. There is associated posterior acoustic enhancement. Evaluation of the left axilla is negative for adenopathy.  Accordingly on 09/23/2018 she proceeded to biopsy of the left breast area in question. The pathology from this procedure showed ([id number]): invasive ductal carcinoma, grade III. Prognostic indicators significant for: estrogen receptor, 100% positive and progesterone receptor, 2% positive, both with strong staining intensity. Proliferation marker Ki67 at 40%. HER2 positive (3+) by immunohistochemistry.   The patient's subsequent history is as detailed above.   PAST MEDICAL HISTORY: Past Medical History:  Diagnosis Date  . Cancer ([place]) 09/23/2018   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 ,   . Gallstones   . GERD (gastroesophageal reflux disease)    resolved   . Peripheral neuropathy due to chemotherapy [facility])      PAST SURGICAL HISTORY: Past Surgical History:  Procedure Laterality Date  . BREAST LUMPECTOMY WITH RADIOACTIVE SEED AND SENTINEL LYMPH NODE BIOPSY Left 04/08/2019   Procedure: LEFT BREAST RADIOACTIVE SEED LUMPECTOMY X2 AND LEFT SENTINEL LYMPH NODE MAPPING;  Surgeon: [name], MD;  Location: [facility];  Service: General;  Laterality: Left;  . [place]; 1995  . CHOLECYSTECTOMY N/A 02/22/2015   Procedure: LAPAROSCOPIC CHOLECYSTECTOMY WITH INTRAOPERATIVE CHOLANGIOGRAM;  Surgeon: [name], MD;  Location: [facility];  Service: General;  Laterality: N/A;  . LAPAROSCOPIC CHOLECYSTECTOMY  02/22/2015  . PORTACATH PLACEMENT Right 10/08/2018   Procedure: INSERTION PORT-A-CATH WITH ULTRASOUND;  Surgeon: [name], MD;  Location: [place] [facility];  Service: General;  Laterality: Right;  . TUBAL LIGATION  1995     FAMILY HISTORY: Family History  Problem Relation  Age of Onset  . Hypertension Sister   . Colon cancer Maternal Aunt   . Breast cancer Neg Hx    Natasha Huffman's father died from sepsis at age 44. Patients' mother is 75 years old as of 09/2018. The patient has 3 brothers and 5 sisters. Patient denies anyone in her family having breast, ovarian, prostate, or pancreatic cancer. [name]'s maternal aunt was diagnosed with colon cancer at age 52.    GYNECOLOGIC HISTORY:  No LMP recorded. Patient is postmenopausal. 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 Her husband, [name], works at a [employer] in [place]. 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 [name] has two children, [name] and [name]. [name] is 31, lives in [place], [place], and is a Animator. [name] is 58, lives in [place], and works for the [employer]. [name] has one grandchild. She attends the [employer] in [place]: [name]'s husband, [name], is automatically her healthcare power of attorney.     HEALTH MAINTENANCE: Social History   Tobacco Use  . Smoking status: Never Smoker  . Smokeless tobacco: Never Used  Substance Use Topics  . Alcohol use: No  . Drug use: No    Colonoscopy: yes, 2015, [facility]  PAP: 09/2017  Bone density: no   No Known Allergies  Current Outpatient Medications  Medication Sig Dispense Refill  . gabapentin (NEURONTIN) 100 MG capsule Take one tablet three times a day with meals and 2 tablets at bedtime 150 capsule 1  . ibuprofen (ADVIL) 800 MG tablet Take 1 tablet (800 mg total) by mouth every 8 (eight) hours as needed. 30 tablet 0  . lidocaine-prilocaine (EMLA) cream Apply to affected area once 30 g 3  . LORazepam (ATIVAN) 0.5 MG tablet TAKE 1 TABLET(0.5 MG) BY  MOUTH AT BEDTIME AS NEEDED FOR NAUSEA OR VOMITING 20 tablet 0  . oxyCODONE (OXY IR/ROXICODONE) 5 MG immediate release tablet Take 1 tablet (5 mg total) by mouth every 6 (six) hours as needed for severe pain. 15 tablet 0  . prochlorperazine (COMPAZINE) 10 MG tablet TAKE 1 TABLET(10 MG) BY MOUTH EVERY 6 HOURS AS NEEDED FOR NAUSEA OR VOMITING 30 tablet 1   No current facility-administered medications for this visit.       OBJECTIVE:    Vitals:   06/08/19 1147  BP: 132/80  Pulse: 87  Resp: 18  Temp: 98 F (36.7 C)  SpO2: 100%   Body mass index is 37.35 kg/m. Wt Readings from Last 3 Encounters:  06/08/19 204 lb 3.2 oz (92.6 kg)  05/18/19 198 lb (89.8 kg)  04/27/19 200 lb (90.7 kg)  ECOG FS: 1 GENERAL: Patient is a well appearing female in no acute distress HEENT:  Sclerae anicteric.  Oropharynx clear and moist. No ulcerations or evidence of oropharyngeal candidiasis. Neck is supple.  NODES:  No cervical, supraclavicular, or axillary lymphadenopathy palpated.  BREAST EXAM:  Right breast is benign, left breast s/p lumpectomy, hyperpigmentation from radiation, no sign of local recurrence, no peeling. LUNGS:  Clear to auscultation bilaterally.  No wheezes or rhonchi. HEART:  Regular rate and rhythm.  No murmur appreciated. ABDOMEN:  Soft, nontender.  Positive, normoactive bowel sounds. No organomegaly palpated. MSK:  No focal spinal tenderness to palpation. Full range of motion bilaterally in the upper extremities. EXTREMITIES:  No peripheral edema.   SKIN:  Clear with no obvious rashes or skin changes. No nail dyscrasia. NEURO:  Nonfocal. Well oriented.  Appropriate affect.    LAB RESULTS:  CMP     Component Value Date/Time   NA 145 06/08/2019 0831   K 3.9 06/08/2019 0831   CL 107 06/08/2019 0831   CO2 25 06/08/2019 0831   GLUCOSE 106 (H) 06/08/2019 0831   BUN 13 06/08/2019 0831   CREATININE 0.79 06/08/2019 0831   CREATININE 0.71 01/11/2019 0815   CALCIUM 8.9 06/08/2019  0831   PROT 7.2 06/08/2019 0831   ALBUMIN 3.7 06/08/2019 0831   AST 31 06/08/2019 0831   AST 19 01/11/2019 0815   ALT 42 06/08/2019 0831   ALT 42 01/11/2019 0815   ALKPHOS 120 06/08/2019 0831   BILITOT 0.3 06/08/2019 0831   BILITOT 0.3 01/11/2019 0815   GFRNONAA >60 06/08/2019 0831   GFRNONAA >60 01/11/2019 0815   GFRAA >60 06/08/2019 0831   GFRAA >60 01/11/2019 0815    No results found for: TOTALPROTELP, ALBUMINELP, A1GS, A2GS, BETS, BETA2SER, GAMS, MSPIKE, SPEI  No results found for: KPAFRELGTCHN, LAMBDASER, KAPLAMBRATIO  Lab Results  Component Value Date   WBC 4.0 06/08/2019   NEUTROABS 2.5 06/08/2019   HGB 10.9 (L) 06/08/2019   HCT 34.3 (L) 06/08/2019   MCV 85.8 06/08/2019   PLT 296 06/08/2019    _0 @  No results found for: LABCA2  No components found for: [id number]  No results for input(s): INR in the last 168 hours.  No results found for: LABCA2  No results found for: NIO270  No results found for: JJK093  No results found for: GHW299  No results found for: CA2729  No components found for: HGQUANT  No results found for: CEA1 / No results found for: CEA1   No results found for: AFPTUMOR  No results found for: CHROMOGRNA  No results found for: PSA1  Appointment on 06/08/2019  Component Date Value Ref Range Status  . WBC 06/08/2019 4.0  4.0 - 10.5 K/uL Final  . RBC 06/08/2019 4.00  3.87 - 5.11 MIL/uL Final  . Hemoglobin 06/08/2019 10.9* 12.0 - 15.0 g/dL Final  . HCT 06/08/2019 34.3* 36.0 - 46.0 % Final  . MCV 06/08/2019 85.8  80.0 - 100.0 fL Final  . MCH 06/08/2019 27.3  26.0 - 34.0 pg Final  . MCHC 06/08/2019 31.8  30.0 - 36.0 g/dL Final  . RDW 06/08/2019 12.9  11.5 - 15.5 % Final  . Platelets 06/08/2019 296  150 - 400 K/uL Final  . nRBC 06/08/2019 0.0  0.0 - 0.2 % Final  . Neutrophils Relative % 06/08/2019 62  % Final  . Neutro Abs 06/08/2019 2.5  1.7 - 7.7 K/uL Final  . Lymphocytes Relative 06/08/2019 23  % Final  . Lymphs Abs  06/08/2019 0.9  0.7 - 4.0 K/uL Final  . Monocytes Relative 06/08/2019 11  % Final  . Monocytes Absolute 06/08/2019 0.4  0.1 - 1.0 K/uL Final  . Eosinophils Relative 06/08/2019 3  % Final  . Eosinophils Absolute 06/08/2019 0.1  0.0 - 0.5 K/uL Final  . Basophils Relative 06/08/2019 1  % Final  . Basophils Absolute 06/08/2019 0.0  0.0 - 0.1 K/uL Final  . Immature Granulocytes 06/08/2019 0  % Final  .  Abs Immature Granulocytes 06/08/2019 0.01  0.00 - 0.07 K/uL Final   Performed at [facility], [place] [address] [place], [place] [address]  . Sodium 06/08/2019 145  135 - 145 mmol/L Final  . Potassium 06/08/2019 3.9  3.5 - 5.1 mmol/L Final  . Chloride 06/08/2019 107  98 - 111 mmol/L Final  . CO2 06/08/2019 25  22 - 32 mmol/L Final  . Glucose, Bld 06/08/2019 106* 70 - 99 mg/dL Final  . BUN 06/08/2019 13  6 - 20 mg/dL Final  . Creatinine, Ser 06/08/2019 0.79  0.44 - 1.00 mg/dL Final  . Calcium 06/08/2019 8.9  8.9 - 10.3 mg/dL Final  . Total Protein 06/08/2019 7.2  6.5 - 8.1 g/dL Final  . Albumin 06/08/2019 3.7  3.5 - 5.0 g/dL Final  . AST 06/08/2019 31  15 - 41 U/L Final  . ALT 06/08/2019 42  0 - 44 U/L Final  . Alkaline Phosphatase 06/08/2019 120  38 - 126 U/L Final  . Total Bilirubin 06/08/2019 0.3  0.3 - 1.2 mg/dL Final  . GFR calc non Af Amer 06/08/2019 >60  >60 mL/min Final  . GFR calc Af Amer 06/08/2019 >60  >60 mL/min Final  . Anion gap 06/08/2019 13  5 - 15 Final   Performed at [facility], [address] [place], [place] [address]    (this displays the last labs from the last 3 days)  No results found for: TOTALPROTELP, ALBUMINELP, A1GS, A2GS, BETS, BETA2SER, GAMS, MSPIKE, SPEI (this displays SPEP labs)  No results found for: KPAFRELGTCHN, LAMBDASER, KAPLAMBRATIO (kappa/lambda light chains)  No results found for: HGBA, HGBA2QUANT, HGBFQUANT, HGBSQUAN (Hemoglobinopathy evaluation)   No results found for: LDH  No results found  for: IRON, TIBC, IRONPCTSAT (Iron and TIBC)  No results found for: FERRITIN  Urinalysis    Component Value Date/Time   COLORURINE YELLOW 02/22/2015 0118   APPEARANCEUR CLOUDY (A) 02/22/2015 0118   LABSPEC 1.019 02/22/2015 0118   PHURINE 7.0 02/22/2015 0118   GLUCOSEU NEGATIVE 02/22/2015 0118   HGBUR NEGATIVE 02/22/2015 0118   BILIRUBINUR NEGATIVE 02/22/2015 0118   KETONESUR NEGATIVE 02/22/2015 0118   PROTEINUR NEGATIVE 02/22/2015 0118   UROBILINOGEN 1.0 02/22/2015 0118   NITRITE NEGATIVE 02/22/2015 0118   LEUKOCYTESUR NEGATIVE 02/22/2015 0118     STUDIES:  No results found.   ELIGIBLE FOR AVAILABLE RESEARCH PROTOCOL: no   ASSESSMENT: 58 y.o. [name], [place] woman status post left breast upper outer quadrant biopsy 09/23/2018 for a clinical T1c N0, stage IA invasive ductal carcinoma, grade 3, triple positive, with an MIB-1 of 40%  (1) neoadjuvant chemotherapy consisting of carboplatin, docetaxel, and trastuzumab started 10/22/2018, to be repeated every 21 days x 6  (a) echocardiogram on 10/07/2018 shows a LVEF of 60-65%.  (b) docetaxel discontinued and gemcitabine substituted with cycle 5 because of neuropathy  (2) trastuzumab to continue to complete a year   (a) echocardiogram 01/25/2019 showed an ejection fraction of greater than 65%  (b) trastuzumab changed to T-DM1 post-op  (3) status post left lumpectomy and sentinel lymph node sampling 04/08/2019 showing a residual ypT1c ypN0 residual invasive ductal carcinoma, grade 3, margins negative, again HER-2 positive  (a) 4 lymph nodes were negative  (4) TDM-1 to starting on 05/18/2019 due to residual disease after neoadjuvant chemotherapy  (a) echo on 05/05/2019 shows an EF of 60-65%.  (4) adjuvant radiation began on 05/12/2019  (5) antiestrogens to start at the completion of local treatment  PLAN: Loris is doing well today.  She tolerated her first cycle of TDM1 well. Her labs are stable and I reviewed those with  her in detail.  She will continue on this treatment every 3 weeks through March of 2021.    LXBWIO and I discussed her radiation.  Her skin is tolerating it quite well.  She is continuing through this for a couple more weeks.  Jamisha is having minimal fatigue or decreased tolerance of her treatment.  She and I discussed healthy diet and exercise.    She will return in 3 weeks for labs and TDM1, we will see her back in 6 weeks prior to her TDM1.  She was recommended to continue with the appropriate pandemic precautions. She knows to call for any questions that may arise between now and her next appointment.  We are happy to see her sooner if needed.  A total of (20) minutes of face-to-face time was spent with this patient with greater than 50% of that time in counseling and care-coordination.    Wilber Bihari, NP  06/08/19 11:54 AM Medical Oncology and Hematology [facility] [address] [place], [place] [address] Tel. [phone]    Fax. [phone]

## 2019-06-08 NOTE — Patient Instructions (Signed)
[facility] Discharge Instructions for Patients Receiving Chemotherapy  Today you received the following chemotherapy agents Kadcyla  To help prevent nausea and vomiting after your treatment, we encourage you to take your nausea medication as directed   If you develop nausea and vomiting that is not controlled by your nausea medication, call the clinic.   BELOW ARE SYMPTOMS THAT SHOULD BE REPORTED IMMEDIATELY:  *FEVER GREATER THAN 100.5 F  *CHILLS WITH OR WITHOUT FEVER  NAUSEA AND VOMITING THAT IS NOT CONTROLLED WITH YOUR NAUSEA MEDICATION  *UNUSUAL SHORTNESS OF BREATH  *UNUSUAL BRUISING OR BLEEDING  TENDERNESS IN MOUTH AND THROAT WITH OR WITHOUT PRESENCE OF ULCERS  *URINARY PROBLEMS  *BOWEL PROBLEMS  UNUSUAL RASH Items with * indicate a potential emergency and should be followed up as soon as possible.  Feel free to call the clinic should you have any questions or concerns. The clinic phone number is [phone].  Please show the CHEMO ALERT CARD at check-in to the Emergency Department and triage nurse.   

## 2019-06-09 ENCOUNTER — Ambulatory Visit
Admission: RE | Admit: 2019-06-09 | Discharge: 2019-06-09 | Disposition: A | Discharge status: Home / Self Care | Payer: 59 | Source: Ambulatory Visit | Attending: Radiation Oncology | Admitting: Radiation Oncology

## 2019-06-09 ENCOUNTER — Other Ambulatory Visit: Payer: Self-pay

## 2019-06-09 DIAGNOSIS — Z923 Personal history of irradiation: Secondary | ICD-10-CM | POA: Diagnosis not present

## 2019-06-09 DIAGNOSIS — Z794 Long term (current) use of insulin: Secondary | ICD-10-CM | POA: Diagnosis not present

## 2019-06-09 DIAGNOSIS — G629 Polyneuropathy, unspecified: Secondary | ICD-10-CM | POA: Diagnosis not present

## 2019-06-09 DIAGNOSIS — Z23 Encounter for immunization: Secondary | ICD-10-CM | POA: Diagnosis not present

## 2019-06-09 DIAGNOSIS — Z791 Long term (current) use of non-steroidal anti-inflammatories (NSAID): Secondary | ICD-10-CM | POA: Diagnosis not present

## 2019-06-09 DIAGNOSIS — Z5112 Encounter for antineoplastic immunotherapy: Secondary | ICD-10-CM | POA: Diagnosis not present

## 2019-06-09 DIAGNOSIS — E109 Type 1 diabetes mellitus without complications: Secondary | ICD-10-CM | POA: Diagnosis not present

## 2019-06-09 DIAGNOSIS — C50412 Malignant neoplasm of upper-outer quadrant of left female breast: Secondary | ICD-10-CM | POA: Diagnosis not present

## 2019-06-09 DIAGNOSIS — Z170 Estrogen receptor positive status [ER+]: Secondary | ICD-10-CM | POA: Diagnosis not present

## 2019-06-10 ENCOUNTER — Ambulatory Visit
Admission: RE | Admit: 2019-06-10 | Discharge: 2019-06-10 | Disposition: A | Discharge status: Home / Self Care | Payer: 59 | Source: Ambulatory Visit | Attending: Radiation Oncology | Admitting: Radiation Oncology

## 2019-06-10 ENCOUNTER — Other Ambulatory Visit: Payer: Self-pay

## 2019-06-10 DIAGNOSIS — Z23 Encounter for immunization: Secondary | ICD-10-CM | POA: Diagnosis not present

## 2019-06-10 DIAGNOSIS — Z791 Long term (current) use of non-steroidal anti-inflammatories (NSAID): Secondary | ICD-10-CM | POA: Diagnosis not present

## 2019-06-10 DIAGNOSIS — Z923 Personal history of irradiation: Secondary | ICD-10-CM | POA: Diagnosis not present

## 2019-06-10 DIAGNOSIS — Z5112 Encounter for antineoplastic immunotherapy: Secondary | ICD-10-CM | POA: Diagnosis not present

## 2019-06-10 DIAGNOSIS — C50412 Malignant neoplasm of upper-outer quadrant of left female breast: Secondary | ICD-10-CM | POA: Diagnosis not present

## 2019-06-10 DIAGNOSIS — Z794 Long term (current) use of insulin: Secondary | ICD-10-CM | POA: Diagnosis not present

## 2019-06-10 DIAGNOSIS — G629 Polyneuropathy, unspecified: Secondary | ICD-10-CM | POA: Diagnosis not present

## 2019-06-10 DIAGNOSIS — E109 Type 1 diabetes mellitus without complications: Secondary | ICD-10-CM | POA: Diagnosis not present

## 2019-06-10 DIAGNOSIS — Z170 Estrogen receptor positive status [ER+]: Secondary | ICD-10-CM | POA: Diagnosis not present

## 2019-06-11 ENCOUNTER — Ambulatory Visit
Admission: RE | Admit: 2019-06-11 | Discharge: 2019-06-11 | Disposition: A | Discharge status: Home / Self Care | Payer: 59 | Source: Ambulatory Visit | Attending: Radiation Oncology | Admitting: Radiation Oncology

## 2019-06-11 ENCOUNTER — Other Ambulatory Visit: Payer: Self-pay

## 2019-06-11 DIAGNOSIS — Z170 Estrogen receptor positive status [ER+]: Secondary | ICD-10-CM | POA: Diagnosis not present

## 2019-06-11 DIAGNOSIS — Z794 Long term (current) use of insulin: Secondary | ICD-10-CM | POA: Diagnosis not present

## 2019-06-11 DIAGNOSIS — Z923 Personal history of irradiation: Secondary | ICD-10-CM | POA: Diagnosis not present

## 2019-06-11 DIAGNOSIS — Z791 Long term (current) use of non-steroidal anti-inflammatories (NSAID): Secondary | ICD-10-CM | POA: Diagnosis not present

## 2019-06-11 DIAGNOSIS — Z5112 Encounter for antineoplastic immunotherapy: Secondary | ICD-10-CM | POA: Diagnosis not present

## 2019-06-11 DIAGNOSIS — E109 Type 1 diabetes mellitus without complications: Secondary | ICD-10-CM | POA: Diagnosis not present

## 2019-06-11 DIAGNOSIS — Z23 Encounter for immunization: Secondary | ICD-10-CM | POA: Diagnosis not present

## 2019-06-11 DIAGNOSIS — G629 Polyneuropathy, unspecified: Secondary | ICD-10-CM | POA: Diagnosis not present

## 2019-06-11 DIAGNOSIS — C50412 Malignant neoplasm of upper-outer quadrant of left female breast: Secondary | ICD-10-CM | POA: Diagnosis not present

## 2019-06-14 ENCOUNTER — Other Ambulatory Visit: Payer: Self-pay

## 2019-06-14 ENCOUNTER — Ambulatory Visit
Admission: RE | Admit: 2019-06-14 | Discharge: 2019-06-14 | Disposition: A | Discharge status: Home / Self Care | Payer: 59 | Source: Ambulatory Visit | Attending: Radiation Oncology | Admitting: Radiation Oncology

## 2019-06-14 DIAGNOSIS — C50412 Malignant neoplasm of upper-outer quadrant of left female breast: Secondary | ICD-10-CM | POA: Insufficient documentation

## 2019-06-14 DIAGNOSIS — Z510 Encounter for antineoplastic radiation therapy: Secondary | ICD-10-CM | POA: Diagnosis not present

## 2019-06-14 DIAGNOSIS — Z170 Estrogen receptor positive status [ER+]: Secondary | ICD-10-CM | POA: Insufficient documentation

## 2019-06-15 ENCOUNTER — Ambulatory Visit
Admission: RE | Admit: 2019-06-15 | Discharge: 2019-06-15 | Disposition: A | Discharge status: Home / Self Care | Payer: 59 | Source: Ambulatory Visit | Attending: Radiation Oncology | Admitting: Radiation Oncology

## 2019-06-15 ENCOUNTER — Other Ambulatory Visit: Payer: Self-pay

## 2019-06-15 DIAGNOSIS — Z170 Estrogen receptor positive status [ER+]: Secondary | ICD-10-CM | POA: Diagnosis not present

## 2019-06-15 DIAGNOSIS — C50412 Malignant neoplasm of upper-outer quadrant of left female breast: Secondary | ICD-10-CM | POA: Diagnosis not present

## 2019-06-15 DIAGNOSIS — Z510 Encounter for antineoplastic radiation therapy: Secondary | ICD-10-CM | POA: Diagnosis not present

## 2019-06-16 ENCOUNTER — Ambulatory Visit
Admission: RE | Admit: 2019-06-16 | Discharge: 2019-06-16 | Disposition: A | Discharge status: Home / Self Care | Payer: 59 | Source: Ambulatory Visit | Attending: Radiation Oncology | Admitting: Radiation Oncology

## 2019-06-16 ENCOUNTER — Other Ambulatory Visit: Payer: Self-pay

## 2019-06-16 DIAGNOSIS — Z510 Encounter for antineoplastic radiation therapy: Secondary | ICD-10-CM | POA: Diagnosis not present

## 2019-06-16 DIAGNOSIS — C50412 Malignant neoplasm of upper-outer quadrant of left female breast: Secondary | ICD-10-CM | POA: Diagnosis not present

## 2019-06-16 DIAGNOSIS — Z170 Estrogen receptor positive status [ER+]: Secondary | ICD-10-CM | POA: Diagnosis not present

## 2019-06-17 ENCOUNTER — Other Ambulatory Visit: Payer: Self-pay

## 2019-06-17 ENCOUNTER — Ambulatory Visit
Admission: RE | Admit: 2019-06-17 | Discharge: 2019-06-17 | Disposition: A | Discharge status: Home / Self Care | Payer: 59 | Source: Ambulatory Visit | Attending: Radiation Oncology | Admitting: Radiation Oncology

## 2019-06-17 DIAGNOSIS — C50412 Malignant neoplasm of upper-outer quadrant of left female breast: Secondary | ICD-10-CM | POA: Diagnosis not present

## 2019-06-17 DIAGNOSIS — Z510 Encounter for antineoplastic radiation therapy: Secondary | ICD-10-CM | POA: Diagnosis not present

## 2019-06-17 DIAGNOSIS — Z170 Estrogen receptor positive status [ER+]: Secondary | ICD-10-CM | POA: Diagnosis not present

## 2019-06-18 ENCOUNTER — Ambulatory Visit
Admission: RE | Admit: 2019-06-18 | Discharge: 2019-06-18 | Disposition: A | Discharge status: Home / Self Care | Payer: 59 | Source: Ambulatory Visit | Attending: Radiation Oncology | Admitting: Radiation Oncology

## 2019-06-18 ENCOUNTER — Other Ambulatory Visit: Payer: Self-pay

## 2019-06-18 DIAGNOSIS — Z170 Estrogen receptor positive status [ER+]: Secondary | ICD-10-CM | POA: Diagnosis not present

## 2019-06-18 DIAGNOSIS — C50412 Malignant neoplasm of upper-outer quadrant of left female breast: Secondary | ICD-10-CM | POA: Diagnosis not present

## 2019-06-18 DIAGNOSIS — Z510 Encounter for antineoplastic radiation therapy: Secondary | ICD-10-CM | POA: Diagnosis not present

## 2019-06-21 ENCOUNTER — Other Ambulatory Visit: Payer: Self-pay

## 2019-06-21 ENCOUNTER — Ambulatory Visit
Admission: RE | Admit: 2019-06-21 | Discharge: 2019-06-21 | Disposition: A | Discharge status: Home / Self Care | Payer: 59 | Source: Ambulatory Visit | Attending: Radiation Oncology | Admitting: Radiation Oncology

## 2019-06-21 DIAGNOSIS — Z510 Encounter for antineoplastic radiation therapy: Secondary | ICD-10-CM | POA: Diagnosis not present

## 2019-06-21 DIAGNOSIS — C50412 Malignant neoplasm of upper-outer quadrant of left female breast: Secondary | ICD-10-CM | POA: Diagnosis not present

## 2019-06-21 DIAGNOSIS — Z170 Estrogen receptor positive status [ER+]: Secondary | ICD-10-CM | POA: Diagnosis not present

## 2019-06-22 ENCOUNTER — Ambulatory Visit
Admission: RE | Admit: 2019-06-22 | Discharge: 2019-06-22 | Disposition: A | Discharge status: Home / Self Care | Payer: 59 | Source: Ambulatory Visit | Attending: Radiation Oncology | Admitting: Radiation Oncology

## 2019-06-22 ENCOUNTER — Other Ambulatory Visit: Payer: Self-pay

## 2019-06-22 DIAGNOSIS — Z170 Estrogen receptor positive status [ER+]: Secondary | ICD-10-CM | POA: Diagnosis not present

## 2019-06-22 DIAGNOSIS — Z510 Encounter for antineoplastic radiation therapy: Secondary | ICD-10-CM | POA: Diagnosis not present

## 2019-06-22 DIAGNOSIS — C50412 Malignant neoplasm of upper-outer quadrant of left female breast: Secondary | ICD-10-CM | POA: Diagnosis not present

## 2019-06-23 ENCOUNTER — Ambulatory Visit
Admission: RE | Admit: 2019-06-23 | Discharge: 2019-06-23 | Disposition: A | Discharge status: Home / Self Care | Payer: 59 | Source: Ambulatory Visit | Attending: Radiation Oncology | Admitting: Radiation Oncology

## 2019-06-23 ENCOUNTER — Other Ambulatory Visit: Payer: Self-pay

## 2019-06-23 DIAGNOSIS — Z510 Encounter for antineoplastic radiation therapy: Secondary | ICD-10-CM | POA: Diagnosis not present

## 2019-06-23 DIAGNOSIS — Z170 Estrogen receptor positive status [ER+]: Secondary | ICD-10-CM | POA: Diagnosis not present

## 2019-06-23 DIAGNOSIS — C50412 Malignant neoplasm of upper-outer quadrant of left female breast: Secondary | ICD-10-CM | POA: Diagnosis not present

## 2019-06-24 ENCOUNTER — Ambulatory Visit
Admission: RE | Admit: 2019-06-24 | Discharge: 2019-06-24 | Disposition: A | Discharge status: Home / Self Care | Payer: 59 | Source: Ambulatory Visit | Attending: Radiation Oncology | Admitting: Radiation Oncology

## 2019-06-24 ENCOUNTER — Other Ambulatory Visit: Payer: Self-pay

## 2019-06-24 DIAGNOSIS — C50412 Malignant neoplasm of upper-outer quadrant of left female breast: Secondary | ICD-10-CM | POA: Diagnosis not present

## 2019-06-24 DIAGNOSIS — Z510 Encounter for antineoplastic radiation therapy: Secondary | ICD-10-CM | POA: Diagnosis not present

## 2019-06-24 DIAGNOSIS — Z170 Estrogen receptor positive status [ER+]: Secondary | ICD-10-CM | POA: Diagnosis not present

## 2019-06-25 ENCOUNTER — Other Ambulatory Visit: Payer: Self-pay

## 2019-06-25 ENCOUNTER — Encounter: Payer: Self-pay | Admitting: Radiation Oncology

## 2019-06-25 ENCOUNTER — Encounter: Payer: Self-pay | Admitting: *Deleted

## 2019-06-25 ENCOUNTER — Ambulatory Visit
Admission: RE | Admit: 2019-06-25 | Discharge: 2019-06-25 | Disposition: A | Discharge status: Home / Self Care | Payer: 59 | Source: Ambulatory Visit | Attending: Radiation Oncology | Admitting: Radiation Oncology

## 2019-06-25 DIAGNOSIS — Z510 Encounter for antineoplastic radiation therapy: Secondary | ICD-10-CM | POA: Diagnosis not present

## 2019-06-25 DIAGNOSIS — C50412 Malignant neoplasm of upper-outer quadrant of left female breast: Secondary | ICD-10-CM | POA: Diagnosis not present

## 2019-06-25 DIAGNOSIS — Z170 Estrogen receptor positive status [ER+]: Secondary | ICD-10-CM | POA: Diagnosis not present

## 2019-06-29 ENCOUNTER — Inpatient Hospital Stay: Payer: 59

## 2019-06-29 ENCOUNTER — Inpatient Hospital Stay: Payer: 59 | Attending: Oncology

## 2019-06-29 ENCOUNTER — Other Ambulatory Visit: Payer: Self-pay | Admitting: Oncology

## 2019-06-29 ENCOUNTER — Other Ambulatory Visit: Payer: Self-pay

## 2019-06-29 VITALS — BP 129/77 | HR 86 | Temp 97.9°F | Resp 17

## 2019-06-29 DIAGNOSIS — Z171 Estrogen receptor negative status [ER-]: Secondary | ICD-10-CM | POA: Insufficient documentation

## 2019-06-29 DIAGNOSIS — C50412 Malignant neoplasm of upper-outer quadrant of left female breast: Secondary | ICD-10-CM

## 2019-06-29 DIAGNOSIS — Z95828 Presence of other vascular implants and grafts: Secondary | ICD-10-CM

## 2019-06-29 DIAGNOSIS — Z5112 Encounter for antineoplastic immunotherapy: Secondary | ICD-10-CM | POA: Insufficient documentation

## 2019-06-29 DIAGNOSIS — Z170 Estrogen receptor positive status [ER+]: Secondary | ICD-10-CM

## 2019-06-29 LAB — COMPREHENSIVE METABOLIC PANEL
ALT: 33 U/L (ref 0–44)
AST: 24 U/L (ref 15–41)
Albumin: 4 g/dL (ref 3.5–5.0)
Alkaline Phosphatase: 126 U/L (ref 38–126)
Anion gap: 14 (ref 5–15)
BUN: 13 mg/dL (ref 6–20)
CO2: 24 mmol/L (ref 22–32)
Calcium: 9.1 mg/dL (ref 8.9–10.3)
Chloride: 105 mmol/L (ref 98–111)
Creatinine, Ser: 0.78 mg/dL (ref 0.44–1.00)
GFR calc Af Amer: >60 mL/min (ref >60)
GFR calc non Af Amer: >60 mL/min (ref >60)
Glucose, Bld: 120 mg/dL — ABNORMAL HIGH (ref 70–99)
Potassium: 4 mmol/L (ref 3.5–5.1)
Sodium: 143 mmol/L (ref 135–145)
Total Bilirubin: 0.3 mg/dL (ref 0.3–1.2)
Total Protein: 7.7 g/dL (ref 6.5–8.1)

## 2019-06-29 LAB — CBC WITH DIFFERENTIAL/PLATELET
Abs Immature Granulocytes: 0.01 10*3/uL (ref 0.00–0.07)
Basophils Absolute: 0 10*3/uL (ref 0.0–0.1)
Basophils Relative: 1 %
Eosinophils Absolute: 0.1 10*3/uL (ref 0.0–0.5)
Eosinophils Relative: 3 %
HCT: 37 % (ref 36.0–46.0)
Hemoglobin: 11.6 g/dL — ABNORMAL LOW (ref 12.0–15.0)
Immature Granulocytes: 0 %
Lymphocytes Relative: 22 %
Lymphs Abs: 0.8 10*3/uL (ref 0.7–4.0)
MCH: 26.9 pg (ref 26.0–34.0)
MCHC: 31.4 g/dL (ref 30.0–36.0)
MCV: 85.8 fL (ref 80.0–100.0)
Monocytes Absolute: 0.5 10*3/uL (ref 0.1–1.0)
Monocytes Relative: 13 %
Neutro Abs: 2.2 10*3/uL (ref 1.7–7.7)
Neutrophils Relative %: 61 %
Platelets: 282 10*3/uL (ref 150–400)
RBC: 4.31 MIL/uL (ref 3.87–5.11)
RDW: 13 % (ref 11.5–15.5)
WBC: 3.6 10*3/uL — ABNORMAL LOW (ref 4.0–10.5)
nRBC: 0 % (ref 0.0–0.2)

## 2019-06-29 MED ORDER — ACETAMINOPHEN 325 MG PO TABS
ORAL_TABLET | ORAL | Status: AC
  Filled 2019-06-29: qty 2

## 2019-06-29 MED ORDER — DIPHENHYDRAMINE HCL 25 MG PO CAPS
25.0000 mg | ORAL_CAPSULE | Freq: Once | ORAL | Status: AC
  Administered 2019-06-29: 25 mg via ORAL

## 2019-06-29 MED ORDER — ACETAMINOPHEN 325 MG PO TABS
650.0000 mg | ORAL_TABLET | Freq: Once | ORAL | Status: AC
  Administered 2019-06-29: 650 mg via ORAL

## 2019-06-29 MED ORDER — SODIUM CHLORIDE 0.9 % IV SOLN
Freq: Once | INTRAVENOUS | Status: AC
  Administered 2019-06-29: 09:00:00 via INTRAVENOUS
  Filled 2019-06-29: qty 250

## 2019-06-29 MED ORDER — SODIUM CHLORIDE 0.9 % IV SOLN
3.5000 mg/kg | Freq: Once | INTRAVENOUS | Status: AC
  Administered 2019-06-29: 320 mg via INTRAVENOUS
  Filled 2019-06-29: qty 16

## 2019-06-29 MED ORDER — HEPARIN SOD (PORK) LOCK FLUSH 100 UNIT/ML IV SOLN
500.0000 [IU] | Freq: Once | INTRAVENOUS | Status: AC | PRN
  Administered 2019-06-29: 11:00:00 500 [IU]
  Filled 2019-06-29: qty 5

## 2019-06-29 MED ORDER — SODIUM CHLORIDE 0.9% FLUSH
10.0000 mL | INTRAVENOUS | Status: DC | PRN
  Administered 2019-06-29: 10 mL
  Filled 2019-06-29: qty 10

## 2019-06-29 MED ORDER — DIPHENHYDRAMINE HCL 25 MG PO CAPS
ORAL_CAPSULE | ORAL | Status: AC
  Filled 2019-06-29: qty 1

## 2019-06-29 MED ORDER — ALTEPLASE 2 MG IJ SOLR
INTRAMUSCULAR | Status: AC
  Filled 2019-06-29: qty 2

## 2019-06-29 MED ORDER — ALTEPLASE 2 MG IJ SOLR
2.0000 mg | Freq: Once | INTRAMUSCULAR | Status: AC | PRN
  Administered 2019-06-29: 08:00:00 2 mg
  Filled 2019-06-29: qty 2

## 2019-06-29 NOTE — Patient Instructions (Signed)

## 2019-06-29 NOTE — Progress Notes (Signed)
No blood return, drew labs peripherally. Delfin Gant, RN gave CATHFLO at [phone].

## 2019-06-29 NOTE — Patient Instructions (Signed)
[facility] Discharge Instructions for Patients Receiving Chemotherapy  Today you received the following chemotherapy agents Kadcyla  To help prevent nausea and vomiting after your treatment, we encourage you to take your nausea medication as directed   If you develop nausea and vomiting that is not controlled by your nausea medication, call the clinic.   BELOW ARE SYMPTOMS THAT SHOULD BE REPORTED IMMEDIATELY:  *FEVER GREATER THAN 100.5 F  *CHILLS WITH OR WITHOUT FEVER  NAUSEA AND VOMITING THAT IS NOT CONTROLLED WITH YOUR NAUSEA MEDICATION  *UNUSUAL SHORTNESS OF BREATH  *UNUSUAL BRUISING OR BLEEDING  TENDERNESS IN MOUTH AND THROAT WITH OR WITHOUT PRESENCE OF ULCERS  *URINARY PROBLEMS  *BOWEL PROBLEMS  UNUSUAL RASH Items with * indicate a potential emergency and should be followed up as soon as possible.  Feel free to call the clinic should you have any questions or concerns. The clinic phone number is [phone].  Please show the CHEMO ALERT CARD at check-in to the Emergency Department and triage nurse.   

## 2019-06-30 MED FILL — GABAPENTIN 100 MG CAPSULE: 100 | 30 days supply | Qty: 150 | Fill #1

## 2019-07-14 NOTE — Progress Notes (Signed)
  Radiation Oncology         [phone] ________________________________  Name: Natasha Huffman MRN: [id number]  Date: 06/25/2019  DOB: 1961/03/04  End of Treatment Note  Diagnosis:   left-sided breast cancer     Indication for treatment:  Curative       Radiation treatment dates:   05/12/19 - 06/25/19  Site/dose:   The patient initially received a dose of 50.4 Gy in 28 fractions to the breast using whole-breast tangent fields. This was delivered using a 3-D conformal technique. The patient then received a boost to the seroma. This delivered an additional 10 Gy in 5 fractions using a 3-field photon boost technique. The total dose was 60.4 Gy.  Narrative: The patient tolerated radiation treatment relatively well.   The patient had some expected skin irritation as she progressed during treatment. Moist desquamation was not present at the end of treatment.  Plan: The patient has completed radiation treatment. The patient will return to radiation oncology clinic for routine followup in one month. I advised the patient to call or return sooner if they have any questions or concerns related to their recovery or treatment. ________________________________  Jodelle Gross, M.D., Ph.D.

## 2019-07-19 NOTE — Progress Notes (Signed)
[place]  [phone] [phone]    ID: Natasha Huffman DOB: [birth year]  MR#: [id number]  FGH#:[id number]  Patient Care Team: [name], MD as PCP - General (Family Medicine) Rockwell Germany, RN as Oncology Nurse Navigator Mauro Kaufmann, RN as Oncology Nurse Navigator [name], MD as Consulting Physician (General Surgery) Natasha Huffman, [name], MD as Consulting Physician (Oncology) Kyung Rudd, MD as Consulting Physician (Radiation Oncology) Natasha Dresser, MD as Consulting Physician (Cardiology) OTHER MD:    CHIEF COMPLAINT: Triple positive breast cancer  CURRENT TREATMENT: TDM-1    INTERVAL HISTORY: Natasha Huffman returns today for follow-up and treatment of her HER-2 positive breast cancer.   She continues on TDM1 therapy.  She is receiving this instead of trastuzumab because she had significant residual disease after neoadjuvant chemotherapy.  She is tolerating the T-DM1 quite well, with no nausea fatigue or taste alteration.  Her most recent echocardiogram was on 05/05/2019  She completed adjuvant radiation therapy on 06/25/2019.  She had some dry desquamation but otherwise no significant side effects   REVIEW OF SYSTEMS: Reve feels well.  She continues to work full-time though virtually.  Her husband does go to their group home occasionally but mostly he works from home as well.  For exercise she does a little bit walking and dancing.  A detailed review of systems today was otherwise stable   HISTORY OF CURRENT ILLNESS: From the original intake note:  Natasha Huffman had routine screening mammography on 09/23/2018 showing a possible abnormality in the left breast. She underwent unilateral left diagnostic mammography with tomography and left breast ultrasonography at [facility] on 09/23/2018 showing: Breast Density Category B. There is a mass with irregular margins in the upper-outer quadrant of the left breast. On physical  exam, there is palpable focal thickening in the 2 o'clock location of the left breast. Sonographically, an irregular hypoechoic mass with internal vascularity in the 2 o'clock location of the left breast 10 cm from the nipple is seen. The mass measures 1.8 x 1.1 x 1.0 cm. There is associated posterior acoustic enhancement. Evaluation of the left axilla is negative for adenopathy.  Accordingly on 09/23/2018 she proceeded to biopsy of the left breast area in question. The pathology from this procedure showed ([id number]): invasive ductal carcinoma, grade III. Prognostic indicators significant for: estrogen receptor, 100% positive and progesterone receptor, 2% positive, both with strong staining intensity. Proliferation marker Ki67 at 40%. HER2 positive (3+) by immunohistochemistry.   The patient's subsequent history is as detailed above.   PAST MEDICAL HISTORY: Past Medical History:  Diagnosis Date  . Cancer ([place]) 09/23/2018   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 ,   . Gallstones   . GERD (gastroesophageal reflux disease)    resolved   . Peripheral neuropathy due to chemotherapy [facility])      PAST SURGICAL HISTORY: Past Surgical History:  Procedure Laterality Date  . BREAST LUMPECTOMY WITH RADIOACTIVE SEED AND SENTINEL LYMPH NODE BIOPSY Left 04/08/2019   Procedure: LEFT BREAST RADIOACTIVE SEED LUMPECTOMY X2 AND LEFT SENTINEL LYMPH NODE MAPPING;  Surgeon: [name], MD;  Location: [facility];  Service: General;  Laterality: Left;  . [place]; 1995  . CHOLECYSTECTOMY N/A 02/22/2015   Procedure: LAPAROSCOPIC CHOLECYSTECTOMY WITH INTRAOPERATIVE CHOLANGIOGRAM;  Surgeon: [name], MD;  Location: [facility] [facility];  Service: General;  Laterality: N/A;  . LAPAROSCOPIC CHOLECYSTECTOMY  02/22/2015  . PORTACATH PLACEMENT Right 10/08/2018   Procedure: INSERTION PORT-A-CATH  WITH ULTRASOUND;  Surgeon: [name], MD;  Location: [facility];  Service: General;  Laterality: Right;  .  TUBAL LIGATION  1995     FAMILY HISTORY: Family History  Problem Relation Age of Onset  . Hypertension Sister   . Colon cancer Maternal Aunt   . Breast cancer Neg Hx    Natasha Huffman's father died from sepsis at age 75. Patients' mother is 63 years old as of 09/2018. The patient has 3 brothers and 5 sisters. Patient denies anyone in her family having breast, ovarian, prostate, or pancreatic cancer. Ramey's maternal aunt was diagnosed with colon cancer at age 7.    GYNECOLOGIC HISTORY:  No LMP recorded. Patient is postmenopausal. 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 Her husband, [name], works at a [employer] in [place]. 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 Natasha Huffman has two children, [name] and [name]. [name] is 38, lives in [place], [place], and is a Animator. [name] is 2, lives in [place], and works for the [employer]. Jaelynne has one grandchild. She attends the [employer] in [place]: Kenitra's husband, [name], is automatically her healthcare power of attorney.     HEALTH MAINTENANCE: Social History   Tobacco Use  . Smoking status: Never Smoker  . Smokeless tobacco: Never Used  Substance Use Topics  . Alcohol use: No  . Drug use: No    Colonoscopy: yes, 2015, [facility]  PAP: 09/2017  Bone density: no   No Known Allergies  Current Outpatient Medications  Medication Sig Dispense Refill  . gabapentin (NEURONTIN) 100 MG capsule Take one tablet three times a day with meals and 2 tablets at bedtime 150 capsule 1  . ibuprofen (ADVIL) 800 MG tablet Take 1 tablet (800 mg total) by mouth every 8 (eight) hours as needed. 30 tablet 0  . lidocaine-prilocaine (EMLA) cream Apply to affected  area once 30 g 3  . LORazepam (ATIVAN) 0.5 MG tablet TAKE 1 TABLET(0.5 MG) BY MOUTH AT BEDTIME AS NEEDED FOR NAUSEA OR VOMITING 20 tablet 0  . prochlorperazine (COMPAZINE) 10 MG tablet TAKE 1 TABLET(10 MG) BY MOUTH EVERY 6 HOURS AS NEEDED FOR NAUSEA OR VOMITING 30 tablet 1  . tamoxifen (NOLVADEX) 20 MG tablet Take 1 tablet (20 mg total) by mouth daily. Start August 13, 2019 90 tablet 12   No current facility-administered medications for this visit.    Facility-Administered Medications Ordered in Other Visits  Medication Dose Route Frequency Provider Last Rate Last Dose  . sodium chloride flush (NS) 0.9 % injection 10 mL  10 mL Intracatheter PRN Shantika Bermea, [name], MD   10 mL at 07/20/19 1216      OBJECTIVE:   Middle-aged African-American woman in no acute distress Vitals:   07/20/19 0950  BP: 133/84  Pulse: 82  Resp: 20  Temp: (!) 97.3 F (36.3 C)  SpO2: 100%   Body mass index is 35.89 kg/m. Wt Readings from Last 3 Encounters:  07/20/19 196 lb 3.2 oz (89 kg)  06/08/19 204 lb 3.2 oz (92.6 kg)  05/18/19 198 lb (89.8 kg)  ECOG FS: 1  Sclerae unicteric, EOMs intact Wearing a mask No cervical or supraclavicular adenopathy Lungs no rales or rhonchi Heart regular  rate and rhythm Abd soft, nontender, positive bowel sounds MSK no focal spinal tenderness, no upper extremity lymphedema Neuro: nonfocal, well oriented, appropriate affect Breasts: The right breast is unremarkable.  The left breast has undergone lumpectomy and radiation.  Overall the cosmetic result is good.  There is some peeling still going on and some hyperpigmentation.  There is no evidence of local recurrence.  Both axillae are benign.   LAB RESULTS:  CMP     Component Value Date/Time   NA 139 07/20/2019 0945   K 4.0 07/20/2019 0945   CL 103 07/20/2019 0945   CO2 27 07/20/2019 0945   GLUCOSE 113 (H) 07/20/2019 0945   BUN 17 07/20/2019 0945   CREATININE 0.82 07/20/2019 0945   CREATININE 0.71 01/11/2019 0815    CALCIUM 9.1 07/20/2019 0945   PROT 7.8 07/20/2019 0945   ALBUMIN 4.0 07/20/2019 0945   AST 28 07/20/2019 0945   AST 19 01/11/2019 0815   ALT 37 07/20/2019 0945   ALT 42 01/11/2019 0815   ALKPHOS 139 (H) 07/20/2019 0945   BILITOT 0.4 07/20/2019 0945   BILITOT 0.3 01/11/2019 0815   GFRNONAA >60 07/20/2019 0945   GFRNONAA >60 01/11/2019 0815   GFRAA >60 07/20/2019 0945   GFRAA >60 01/11/2019 0815    No results found for: TOTALPROTELP, ALBUMINELP, A1GS, A2GS, BETS, BETA2SER, GAMS, MSPIKE, SPEI  No results found for: KPAFRELGTCHN, LAMBDASER, KAPLAMBRATIO  Lab Results  Component Value Date   WBC 4.7 07/20/2019   NEUTROABS 2.9 07/20/2019   HGB 11.5 (L) 07/20/2019   HCT 36.0 07/20/2019   MCV 84.5 07/20/2019   PLT 312 07/20/2019   No results found for: LABCA2  No components found for: YMEBRA309  No results for input(s): INR in the last 168 hours.  No results found for: LABCA2  No results found for: MMH680  No results found for: SUP103  No results found for: PRX458  No results found for: CA2729  No components found for: HGQUANT  No results found for: CEA1 / No results found for: CEA1   No results found for: AFPTUMOR  No results found for: CHROMOGRNA  No results found for: PSA1  Appointment on 07/20/2019  Component Date Value Ref Range Status  . Sodium 07/20/2019 139  135 - 145 mmol/L Final  . Potassium 07/20/2019 4.0  3.5 - 5.1 mmol/L Final  . Chloride 07/20/2019 103  98 - 111 mmol/L Final  . CO2 07/20/2019 27  22 - 32 mmol/L Final  . Glucose, Bld 07/20/2019 113* 70 - 99 mg/dL Final  . BUN 07/20/2019 17  6 - 20 mg/dL Final  . Creatinine, Ser 07/20/2019 0.82  0.44 - 1.00 mg/dL Final  . Calcium 07/20/2019 9.1  8.9 - 10.3 mg/dL Final  . Total Protein 07/20/2019 7.8  6.5 - 8.1 g/dL Final  . Albumin 07/20/2019 4.0  3.5 - 5.0 g/dL Final  . AST 07/20/2019 28  15 - 41 U/L Final  . ALT 07/20/2019 37  0 - 44 U/L Final  . Alkaline Phosphatase 07/20/2019 139* 38 -  126 U/L Final  . Total Bilirubin 07/20/2019 0.4  0.3 - 1.2 mg/dL Final  . GFR calc non Af Amer 07/20/2019 >60  >60 mL/min Final  . GFR calc Af Amer 07/20/2019 >60  >60 mL/min Final  . Anion gap 07/20/2019 9  5 - 15 Final   Performed at [facility], [place] [address], [place], [place] [address]  . WBC 07/20/2019 4.7  4.0 -  10.5 K/uL Final  . RBC 07/20/2019 4.26  3.87 - 5.11 MIL/uL Final  . Hemoglobin 07/20/2019 11.5* 12.0 - 15.0 g/dL Final  . HCT 07/20/2019 36.0  36.0 - 46.0 % Final  . MCV 07/20/2019 84.5  80.0 - 100.0 fL Final  . MCH 07/20/2019 27.0  26.0 - 34.0 pg Final  . MCHC 07/20/2019 31.9  30.0 - 36.0 g/dL Final  . RDW 07/20/2019 13.1  11.5 - 15.5 % Final  . Platelets 07/20/2019 312  150 - 400 K/uL Final  . nRBC 07/20/2019 0.0  0.0 - 0.2 % Final  . Neutrophils Relative % 07/20/2019 62  % Final  . Neutro Abs 07/20/2019 2.9  1.7 - 7.7 K/uL Final  . Lymphocytes Relative 07/20/2019 25  % Final  . Lymphs Abs 07/20/2019 1.2  0.7 - 4.0 K/uL Final  . Monocytes Relative 07/20/2019 9  % Final  . Monocytes Absolute 07/20/2019 0.4  0.1 - 1.0 K/uL Final  . Eosinophils Relative 07/20/2019 3  % Final  . Eosinophils Absolute 07/20/2019 0.1  0.0 - 0.5 K/uL Final  . Basophils Relative 07/20/2019 1  % Final  . Basophils Absolute 07/20/2019 0.0  0.0 - 0.1 K/uL Final  . Immature Granulocytes 07/20/2019 0  % Final  . Abs Immature Granulocytes 07/20/2019 0.01  0.00 - 0.07 K/uL Final   Performed at [facility], [place] [address] [place], [place] [address]    (this displays the last labs from the last 3 days)  No results found for: TOTALPROTELP, ALBUMINELP, A1GS, A2GS, BETS, BETA2SER, GAMS, MSPIKE, SPEI (this displays SPEP labs)  No results found for: KPAFRELGTCHN, LAMBDASER, KAPLAMBRATIO (kappa/lambda light chains)  No results found for: HGBA, HGBA2QUANT, HGBFQUANT, HGBSQUAN (Hemoglobinopathy evaluation)   No results found for: LDH  No results  found for: IRON, TIBC, IRONPCTSAT (Iron and TIBC)  No results found for: FERRITIN  Urinalysis    Component Value Date/Time   COLORURINE YELLOW 02/22/2015 0118   APPEARANCEUR CLOUDY (A) 02/22/2015 0118   LABSPEC 1.019 02/22/2015 0118   PHURINE 7.0 02/22/2015 0118   GLUCOSEU NEGATIVE 02/22/2015 0118   HGBUR NEGATIVE 02/22/2015 0118   BILIRUBINUR NEGATIVE 02/22/2015 0118   KETONESUR NEGATIVE 02/22/2015 0118   PROTEINUR NEGATIVE 02/22/2015 0118   UROBILINOGEN 1.0 02/22/2015 0118   NITRITE NEGATIVE 02/22/2015 0118   LEUKOCYTESUR NEGATIVE 02/22/2015 0118    STUDIES:  No results found.   ELIGIBLE FOR AVAILABLE RESEARCH PROTOCOL: no   ASSESSMENT: 58 y.o. [name], [place] woman status post left breast upper outer quadrant biopsy 09/23/2018 for a clinical T1c N0, stage IA invasive ductal carcinoma, grade 3, triple positive, with an MIB-1 of 40%  (1) neoadjuvant chemotherapy consisting of carboplatin, docetaxel, and trastuzumab started 10/22/2018, to be repeated every 21 days x 6  (a) echocardiogram on 10/07/2018 shows a LVEF of 60-65%.  (b) docetaxel discontinued and gemcitabine substituted with cycle 5 because of neuropathy  (2) trastuzumab to continue to complete a year   (a) echocardiogram 01/25/2019 showed an ejection fraction of greater than 65%  (b) trastuzumab changed to T-DM1 post-op  (3) status post left lumpectomy and sentinel lymph node sampling 04/08/2019 showing a residual ypT1c ypN0 residual invasive ductal carcinoma, grade 3, margins negative, again HER-2 positive  (a) 4 lymph nodes were negative  (4) TDM-1 started 05/18/2019 due to residual disease after neoadjuvant chemotherapy  (a) echo on 05/05/2019 shows an EF of 60-65%.  (4) adjuvant radiation 05/12/2019 - 06/25/2019  (a) left breast / 50.4 Gy  in 28 fractions  (b) seroma boost / 10 Gy in 5 fractions  (5) to start tamoxifen August 13, 2019   PLAN: Danyia is now 3 months out from definitive surgery for her  breast cancer.  She has completed her surgery and radiation treatments and is still recovering from the radiation, with some skin peeling and itching.  She understands that the occasional "zingers" or shooting pains that she is having in that breast are benign and may or may not resolve with time  She is doing very well with T-DM1.  She is already scheduled for repeat echocardiogram 08/09/2019  Her last treatment will be 10/13/2019 and she will see me that day  She knows to call for any other issue that may develop before the next visit.  [name]. Babygirl Trager, MD  07/20/19 1:23 PM Medical Oncology and Hematology [facility] [place], [place] [address] Tel. [phone]    Fax. [phone]   I, Wilburn Mylar, am acting as scribe for Dr. [name]. Ataya Murdy.  I, Lurline Del MD, have reviewed the above documentation for accuracy and completeness, and I agree with the above.

## 2019-07-20 ENCOUNTER — Inpatient Hospital Stay: Payer: 59 | Attending: Oncology

## 2019-07-20 ENCOUNTER — Inpatient Hospital Stay: Payer: 59

## 2019-07-20 ENCOUNTER — Other Ambulatory Visit: Payer: Self-pay

## 2019-07-20 ENCOUNTER — Inpatient Hospital Stay (HOSPITAL_BASED_OUTPATIENT_CLINIC_OR_DEPARTMENT_OTHER): Payer: 59 | Admitting: Oncology

## 2019-07-20 VITALS — BP 133/84 | HR 82 | Temp 97.3°F | Resp 20 | Ht 62.0 in | Wt 196.2 lb

## 2019-07-20 VITALS — Temp 98.9°F

## 2019-07-20 DIAGNOSIS — C50412 Malignant neoplasm of upper-outer quadrant of left female breast: Secondary | ICD-10-CM | POA: Insufficient documentation

## 2019-07-20 DIAGNOSIS — Z5112 Encounter for antineoplastic immunotherapy: Secondary | ICD-10-CM | POA: Insufficient documentation

## 2019-07-20 DIAGNOSIS — Z791 Long term (current) use of non-steroidal anti-inflammatories (NSAID): Secondary | ICD-10-CM | POA: Insufficient documentation

## 2019-07-20 DIAGNOSIS — Z923 Personal history of irradiation: Secondary | ICD-10-CM | POA: Diagnosis not present

## 2019-07-20 DIAGNOSIS — Z780 Asymptomatic menopausal state: Secondary | ICD-10-CM | POA: Insufficient documentation

## 2019-07-20 DIAGNOSIS — Z170 Estrogen receptor positive status [ER+]: Secondary | ICD-10-CM

## 2019-07-20 DIAGNOSIS — E109 Type 1 diabetes mellitus without complications: Secondary | ICD-10-CM | POA: Insufficient documentation

## 2019-07-20 DIAGNOSIS — K219 Gastro-esophageal reflux disease without esophagitis: Secondary | ICD-10-CM | POA: Insufficient documentation

## 2019-07-20 DIAGNOSIS — Z79899 Other long term (current) drug therapy: Secondary | ICD-10-CM | POA: Insufficient documentation

## 2019-07-20 LAB — COMPREHENSIVE METABOLIC PANEL
ALT: 37 U/L (ref 0–44)
AST: 28 U/L (ref 15–41)
Albumin: 4 g/dL (ref 3.5–5.0)
Alkaline Phosphatase: 139 U/L — ABNORMAL HIGH (ref 38–126)
Anion gap: 9 (ref 5–15)
BUN: 17 mg/dL (ref 6–20)
CO2: 27 mmol/L (ref 22–32)
Calcium: 9.1 mg/dL (ref 8.9–10.3)
Chloride: 103 mmol/L (ref 98–111)
Creatinine, Ser: 0.82 mg/dL (ref 0.44–1.00)
GFR calc Af Amer: >60 mL/min (ref >60)
GFR calc non Af Amer: >60 mL/min (ref >60)
Glucose, Bld: 113 mg/dL — ABNORMAL HIGH (ref 70–99)
Potassium: 4 mmol/L (ref 3.5–5.1)
Sodium: 139 mmol/L (ref 135–145)
Total Bilirubin: 0.4 mg/dL (ref 0.3–1.2)
Total Protein: 7.8 g/dL (ref 6.5–8.1)

## 2019-07-20 LAB — CBC WITH DIFFERENTIAL/PLATELET
Abs Immature Granulocytes: 0.01 10*3/uL (ref 0.00–0.07)
Basophils Absolute: 0 10*3/uL (ref 0.0–0.1)
Basophils Relative: 1 %
Eosinophils Absolute: 0.1 10*3/uL (ref 0.0–0.5)
Eosinophils Relative: 3 %
HCT: 36 % (ref 36.0–46.0)
Hemoglobin: 11.5 g/dL — ABNORMAL LOW (ref 12.0–15.0)
Immature Granulocytes: 0 %
Lymphocytes Relative: 25 %
Lymphs Abs: 1.2 10*3/uL (ref 0.7–4.0)
MCH: 27 pg (ref 26.0–34.0)
MCHC: 31.9 g/dL (ref 30.0–36.0)
MCV: 84.5 fL (ref 80.0–100.0)
Monocytes Absolute: 0.4 10*3/uL (ref 0.1–1.0)
Monocytes Relative: 9 %
Neutro Abs: 2.9 10*3/uL (ref 1.7–7.7)
Neutrophils Relative %: 62 %
Platelets: 312 10*3/uL (ref 150–400)
RBC: 4.26 MIL/uL (ref 3.87–5.11)
RDW: 13.1 % (ref 11.5–15.5)
WBC: 4.7 10*3/uL (ref 4.0–10.5)
nRBC: 0 % (ref 0.0–0.2)

## 2019-07-20 MED ORDER — SODIUM CHLORIDE 0.9% FLUSH
10.0000 mL | INTRAVENOUS | Status: DC | PRN
  Administered 2019-07-20: 10 mL
  Filled 2019-07-20: qty 10

## 2019-07-20 MED ORDER — DIPHENHYDRAMINE HCL 25 MG PO CAPS
ORAL_CAPSULE | ORAL | Status: AC
  Filled 2019-07-20: qty 1

## 2019-07-20 MED ORDER — TAMOXIFEN CITRATE 20 MG PO TABS: 20.0000 mg | ORAL_TABLET | Freq: Every day | ORAL | 12 refills | Status: AC

## 2019-07-20 MED ORDER — ACETAMINOPHEN 325 MG PO TABS
650.0000 mg | ORAL_TABLET | Freq: Once | ORAL | Status: AC
  Administered 2019-07-20: 650 mg via ORAL

## 2019-07-20 MED ORDER — SODIUM CHLORIDE 0.9 % IV SOLN
Freq: Once | INTRAVENOUS | Status: AC
  Administered 2019-07-20: 11:00:00 via INTRAVENOUS
  Filled 2019-07-20: qty 250

## 2019-07-20 MED ORDER — DIPHENHYDRAMINE HCL 25 MG PO CAPS
25.0000 mg | ORAL_CAPSULE | Freq: Once | ORAL | Status: AC
  Administered 2019-07-20: 25 mg via ORAL

## 2019-07-20 MED ORDER — HEPARIN SOD (PORK) LOCK FLUSH 100 UNIT/ML IV SOLN
500.0000 [IU] | Freq: Once | INTRAVENOUS | Status: AC | PRN
  Administered 2019-07-20: 500 [IU]
  Filled 2019-07-20: qty 5

## 2019-07-20 MED ORDER — ACETAMINOPHEN 325 MG PO TABS
ORAL_TABLET | ORAL | Status: AC
  Filled 2019-07-20: qty 2

## 2019-07-20 MED ORDER — SODIUM CHLORIDE 0.9 % IV SOLN
3.4000 mg/kg | Freq: Once | INTRAVENOUS | Status: AC
  Administered 2019-07-20: 320 mg via INTRAVENOUS
  Filled 2019-07-20: qty 16

## 2019-07-20 MED FILL — TAMOXIFEN 20 MG TABLET: 20 | 90 days supply | Qty: 90 | Fill #0

## 2019-07-20 NOTE — Patient Instructions (Signed)
Cancer Center Discharge Instructions for Patients Receiving Chemotherapy  Today you received the following chemotherapy agents Ado-trastuzumab (KADCYLA).  To help prevent nausea and vomiting after your treatment, we encourage you to take your nausea medication as prescribed.   If you develop nausea and vomiting that is not controlled by your nausea medication, call the clinic.   BELOW ARE SYMPTOMS THAT SHOULD BE REPORTED IMMEDIATELY:  *FEVER GREATER THAN 100.5 F  *CHILLS WITH OR WITHOUT FEVER  NAUSEA AND VOMITING THAT IS NOT CONTROLLED WITH YOUR NAUSEA MEDICATION  *UNUSUAL SHORTNESS OF BREATH  *UNUSUAL BRUISING OR BLEEDING  TENDERNESS IN MOUTH AND THROAT WITH OR WITHOUT PRESENCE OF ULCERS  *URINARY PROBLEMS  *BOWEL PROBLEMS  UNUSUAL RASH Items with * indicate a potential emergency and should be followed up as soon as possible.  Feel free to call the clinic should you have any questions or concerns. The clinic phone number is [phone].  Please show the CHEMO ALERT CARD at check-in to the Emergency Department and triage nurse.  Coronavirus (COVID-19) Are you at risk?  Are you at risk for the Coronavirus (COVID-19)?  To be considered HIGH RISK for Coronavirus (COVID-19), you have to meet the following criteria:  . Traveled to [place], [place], [place], [place] or [place]; or in the [place] to [place], [place], [place], or [place]; and have fever, cough, and shortness of breath within the last 2 weeks of travel OR . Been in close contact with a person diagnosed with COVID-19 within the last 2 weeks and have fever, cough, and shortness of breath . IF YOU DO NOT MEET THESE CRITERIA, YOU ARE CONSIDERED LOW RISK FOR COVID-19.  What to do if you are HIGH RISK for COVID-19?  . If you are having a medical emergency, call 911. . Seek medical care right away. Before you go to a doctor's office, urgent care or emergency department, call ahead and tell  them about your recent travel, contact with someone diagnosed with COVID-19, and your symptoms. You should receive instructions from your physician's office regarding next steps of care.  . When you arrive at healthcare provider, tell the healthcare staff immediately you have returned from visiting [place], [place], [place], [place] or [place]; or traveled in the [place] to [place], [place], [place], or [place]; in the last two weeks or you have been in close contact with a person diagnosed with COVID-19 in the last 2 weeks.   . Tell the health care staff about your symptoms: fever, cough and shortness of breath. . After you have been seen by a medical provider, you will be either: o Tested for (COVID-19) and discharged home on quarantine except to seek medical care if symptoms worsen, and asked to  - Stay home and avoid contact with others until you get your results (4-5 days)  - Avoid travel on public transportation if possible (such as bus, train, or airplane) or o Sent to the Emergency Department by EMS for evaluation, COVID-19 testing, and possible admission depending on your condition and test results.  What to do if you are LOW RISK for COVID-19?  Reduce your risk of any infection by using the same precautions used for avoiding the common cold or flu:  . Wash your hands often with soap and warm water for at least 20 seconds.  If soap and water are not readily available, use an alcohol-based hand sanitizer with at least 60% alcohol.  . If coughing or   sneezing, cover your mouth and nose by coughing or sneezing into the elbow areas of your shirt or coat, into a tissue or into your sleeve (not your hands). . Avoid shaking hands with others and consider head nods or verbal greetings only. . Avoid touching your eyes, nose, or mouth with unwashed hands.  . Avoid close contact with people who are sick. . Avoid places or events with large numbers of people in one location, like concerts or  sporting events. . Carefully consider travel plans you have or are making. . If you are planning any travel outside or inside the [place], visit the CDC's Travelers' Health webpage for the latest health notices. . If you have some symptoms but not all symptoms, continue to monitor at home and seek medical attention if your symptoms worsen. . If you are having a medical emergency, call 911.   [place] / e-Visit: [email or web address]         [facility]: [facility]: [phone]                   [facility]: [phone]

## 2019-07-21 ENCOUNTER — Telehealth: Payer: Self-pay | Admitting: Oncology

## 2019-07-21 NOTE — Telephone Encounter (Signed)
I talk with patient regarding schedule  

## 2019-07-28 ENCOUNTER — Telehealth: Payer: Self-pay | Admitting: Radiation Oncology

## 2019-07-28 NOTE — Telephone Encounter (Signed)
  Radiation Oncology         [phone] ________________________________  Name: ADAMARIZ VENEGAS MRN: [id number]  Date of Service: 07/28/2019  DOB: 06/02/1961  Post Treatment Telephone Note  Diagnosis:  Stage IA, cT1cN0M0, grade 3 triple positive invasive ductal carcinoma of the left breast.   Interval Since Last Radiation: 5 weeks   05/12/19 - 06/25/19: The patient initially received a dose of 50.4 Gy in 28 fractions to the breast using whole-breast tangent fields. This was delivered using a 3-D conformal technique. The patient then received a boost to the seroma. This delivered an additional 10 Gy in 5 fractions using a 3-field photon boost technique. The total dose was 60.4 Gy.  Narrative:  The patient was contacted today for routine follow-up. During treatment she did very well with radiotherapy and did not have significant desquamation.   Impression/Plan: 1. 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 I called and left a message for the patient to make sure she knew we would be happy to continue to follow her as needed, but she will also continue to follow up with Dr. Jana Hakim in medical oncology. She was counseled on skin care as well as measures to avoid sun exposure to this area and encouraged to call back so we could discuss this in more detail.     Carola Rhine, PAC

## 2019-08-09 ENCOUNTER — Ambulatory Visit (HOSPITAL_COMMUNITY)
Admission: RE | Admit: 2019-08-09 | Discharge: 2019-08-09 | Disposition: A | Discharge status: Home / Self Care | Payer: 59 | Source: Ambulatory Visit | Attending: Adult Health | Admitting: Adult Health

## 2019-08-09 ENCOUNTER — Other Ambulatory Visit: Payer: Self-pay

## 2019-08-09 DIAGNOSIS — Z170 Estrogen receptor positive status [ER+]: Secondary | ICD-10-CM | POA: Diagnosis not present

## 2019-08-09 DIAGNOSIS — Z5181 Encounter for therapeutic drug level monitoring: Secondary | ICD-10-CM | POA: Insufficient documentation

## 2019-08-09 DIAGNOSIS — C50412 Malignant neoplasm of upper-outer quadrant of left female breast: Secondary | ICD-10-CM

## 2019-08-09 DIAGNOSIS — Z79899 Other long term (current) drug therapy: Secondary | ICD-10-CM | POA: Diagnosis not present

## 2019-08-09 NOTE — Progress Notes (Signed)
  Echocardiogram 2D Echocardiogram has been performed.  Natasha Huffman 08/09/2019, 9:49 AM

## 2019-08-10 ENCOUNTER — Other Ambulatory Visit: Payer: Self-pay

## 2019-08-10 ENCOUNTER — Inpatient Hospital Stay: Payer: 59

## 2019-08-10 ENCOUNTER — Encounter: Payer: Self-pay | Admitting: *Deleted

## 2019-08-10 VITALS — BP 138/85 | HR 82 | Temp 98.0°F | Resp 18

## 2019-08-10 DIAGNOSIS — Z95828 Presence of other vascular implants and grafts: Secondary | ICD-10-CM

## 2019-08-10 DIAGNOSIS — Z170 Estrogen receptor positive status [ER+]: Secondary | ICD-10-CM

## 2019-08-10 DIAGNOSIS — Z923 Personal history of irradiation: Secondary | ICD-10-CM | POA: Diagnosis not present

## 2019-08-10 DIAGNOSIS — C50412 Malignant neoplasm of upper-outer quadrant of left female breast: Secondary | ICD-10-CM

## 2019-08-10 DIAGNOSIS — K219 Gastro-esophageal reflux disease without esophagitis: Secondary | ICD-10-CM | POA: Diagnosis not present

## 2019-08-10 DIAGNOSIS — Z5112 Encounter for antineoplastic immunotherapy: Secondary | ICD-10-CM | POA: Diagnosis not present

## 2019-08-10 DIAGNOSIS — Z79899 Other long term (current) drug therapy: Secondary | ICD-10-CM | POA: Diagnosis not present

## 2019-08-10 DIAGNOSIS — Z791 Long term (current) use of non-steroidal anti-inflammatories (NSAID): Secondary | ICD-10-CM | POA: Diagnosis not present

## 2019-08-10 DIAGNOSIS — Z780 Asymptomatic menopausal state: Secondary | ICD-10-CM | POA: Diagnosis not present

## 2019-08-10 DIAGNOSIS — E109 Type 1 diabetes mellitus without complications: Secondary | ICD-10-CM | POA: Diagnosis not present

## 2019-08-10 LAB — CBC WITH DIFFERENTIAL/PLATELET
Abs Immature Granulocytes: 0.01 10*3/uL (ref 0.00–0.07)
Basophils Absolute: 0 10*3/uL (ref 0.0–0.1)
Basophils Relative: 1 %
Eosinophils Absolute: 0.2 10*3/uL (ref 0.0–0.5)
Eosinophils Relative: 4 %
HCT: 34.6 % — ABNORMAL LOW (ref 36.0–46.0)
Hemoglobin: 10.9 g/dL — ABNORMAL LOW (ref 12.0–15.0)
Immature Granulocytes: 0 %
Lymphocytes Relative: 28 %
Lymphs Abs: 1.1 10*3/uL (ref 0.7–4.0)
MCH: 26.8 pg (ref 26.0–34.0)
MCHC: 31.5 g/dL (ref 30.0–36.0)
MCV: 85.2 fL (ref 80.0–100.0)
Monocytes Absolute: 0.4 10*3/uL (ref 0.1–1.0)
Monocytes Relative: 9 %
Neutro Abs: 2.3 10*3/uL (ref 1.7–7.7)
Neutrophils Relative %: 58 %
Platelets: 302 10*3/uL (ref 150–400)
RBC: 4.06 MIL/uL (ref 3.87–5.11)
RDW: 13.2 % (ref 11.5–15.5)
WBC: 4 10*3/uL (ref 4.0–10.5)
nRBC: 0 % (ref 0.0–0.2)

## 2019-08-10 LAB — COMPREHENSIVE METABOLIC PANEL
ALT: 35 U/L (ref 0–44)
AST: 28 U/L (ref 15–41)
Albumin: 3.8 g/dL (ref 3.5–5.0)
Alkaline Phosphatase: 135 U/L — ABNORMAL HIGH (ref 38–126)
Anion gap: 12 (ref 5–15)
BUN: 15 mg/dL (ref 6–20)
CO2: 24 mmol/L (ref 22–32)
Calcium: 8.9 mg/dL (ref 8.9–10.3)
Chloride: 105 mmol/L (ref 98–111)
Creatinine, Ser: 0.77 mg/dL (ref 0.44–1.00)
GFR calc Af Amer: >60 mL/min (ref >60)
GFR calc non Af Amer: >60 mL/min (ref >60)
Glucose, Bld: 109 mg/dL — ABNORMAL HIGH (ref 70–99)
Potassium: 3.8 mmol/L (ref 3.5–5.1)
Sodium: 141 mmol/L (ref 135–145)
Total Bilirubin: 0.2 mg/dL — ABNORMAL LOW (ref 0.3–1.2)
Total Protein: 7.6 g/dL (ref 6.5–8.1)

## 2019-08-10 MED ORDER — ACETAMINOPHEN 325 MG PO TABS
650.0000 mg | ORAL_TABLET | Freq: Once | ORAL | Status: AC
  Administered 2019-08-10: 650 mg via ORAL

## 2019-08-10 MED ORDER — DIPHENHYDRAMINE HCL 25 MG PO CAPS
ORAL_CAPSULE | ORAL | Status: AC
  Filled 2019-08-10: qty 1

## 2019-08-10 MED ORDER — DIPHENHYDRAMINE HCL 25 MG PO CAPS
25.0000 mg | ORAL_CAPSULE | Freq: Once | ORAL | Status: AC
  Administered 2019-08-10: 25 mg via ORAL

## 2019-08-10 MED ORDER — ACETAMINOPHEN 325 MG PO TABS
ORAL_TABLET | ORAL | Status: AC
  Filled 2019-08-10: qty 2

## 2019-08-10 MED ORDER — SODIUM CHLORIDE 0.9 % IV SOLN
3.5000 mg/kg | Freq: Once | INTRAVENOUS | Status: AC
  Administered 2019-08-10: 320 mg via INTRAVENOUS
  Filled 2019-08-10: qty 8

## 2019-08-10 MED ORDER — HEPARIN SOD (PORK) LOCK FLUSH 100 UNIT/ML IV SOLN
500.0000 [IU] | Freq: Once | INTRAVENOUS | Status: AC | PRN
  Administered 2019-08-10: 500 [IU]
  Filled 2019-08-10: qty 5

## 2019-08-10 MED ORDER — SODIUM CHLORIDE 0.9 % IV SOLN
Freq: Once | INTRAVENOUS | Status: AC
  Filled 2019-08-10: qty 250

## 2019-08-10 MED ORDER — SODIUM CHLORIDE 0.9% FLUSH
10.0000 mL | INTRAVENOUS | Status: DC | PRN
  Administered 2019-08-10: 10 mL
  Filled 2019-08-10: qty 10

## 2019-08-10 NOTE — Progress Notes (Signed)
Patient declined to stay for 30 min post obs

## 2019-08-16 DIAGNOSIS — Z23 Encounter for immunization: Secondary | ICD-10-CM | POA: Diagnosis not present

## 2019-08-17 ENCOUNTER — Encounter: Payer: Self-pay | Admitting: *Deleted

## 2019-08-18 ENCOUNTER — Other Ambulatory Visit: Payer: Self-pay | Admitting: Adult Health

## 2019-08-18 DIAGNOSIS — C50412 Malignant neoplasm of upper-outer quadrant of left female breast: Secondary | ICD-10-CM

## 2019-08-31 ENCOUNTER — Inpatient Hospital Stay: Payer: 59 | Attending: Oncology

## 2019-08-31 ENCOUNTER — Inpatient Hospital Stay: Payer: 59

## 2019-08-31 ENCOUNTER — Other Ambulatory Visit: Payer: Self-pay

## 2019-08-31 VITALS — BP 126/81 | HR 91 | Temp 98.3°F | Resp 17 | Wt 201.0 lb

## 2019-08-31 DIAGNOSIS — C50412 Malignant neoplasm of upper-outer quadrant of left female breast: Secondary | ICD-10-CM | POA: Insufficient documentation

## 2019-08-31 DIAGNOSIS — Z5112 Encounter for antineoplastic immunotherapy: Secondary | ICD-10-CM | POA: Insufficient documentation

## 2019-08-31 DIAGNOSIS — Z170 Estrogen receptor positive status [ER+]: Secondary | ICD-10-CM | POA: Insufficient documentation

## 2019-08-31 DIAGNOSIS — Z95828 Presence of other vascular implants and grafts: Secondary | ICD-10-CM

## 2019-08-31 LAB — COMPREHENSIVE METABOLIC PANEL
ALT: 28 U/L (ref 0–44)
AST: 24 U/L (ref 15–41)
Albumin: 3.7 g/dL (ref 3.5–5.0)
Alkaline Phosphatase: 118 U/L (ref 38–126)
Anion gap: 11 (ref 5–15)
BUN: 13 mg/dL (ref 6–20)
CO2: 25 mmol/L (ref 22–32)
Calcium: 8.5 mg/dL — ABNORMAL LOW (ref 8.9–10.3)
Chloride: 105 mmol/L (ref 98–111)
Creatinine, Ser: 0.79 mg/dL (ref 0.44–1.00)
GFR calc Af Amer: >60 mL/min (ref >60)
GFR calc non Af Amer: >60 mL/min (ref >60)
Glucose, Bld: 110 mg/dL — ABNORMAL HIGH (ref 70–99)
Potassium: 4 mmol/L (ref 3.5–5.1)
Sodium: 141 mmol/L (ref 135–145)
Total Bilirubin: 0.4 mg/dL (ref 0.3–1.2)
Total Protein: 7.4 g/dL (ref 6.5–8.1)

## 2019-08-31 LAB — CBC WITH DIFFERENTIAL/PLATELET
Abs Immature Granulocytes: 0.02 10*3/uL (ref 0.00–0.07)
Basophils Absolute: 0 10*3/uL (ref 0.0–0.1)
Basophils Relative: 0 %
Eosinophils Absolute: 0.1 10*3/uL (ref 0.0–0.5)
Eosinophils Relative: 2 %
HCT: 34.3 % — ABNORMAL LOW (ref 36.0–46.0)
Hemoglobin: 10.7 g/dL — ABNORMAL LOW (ref 12.0–15.0)
Immature Granulocytes: 0 %
Lymphocytes Relative: 24 %
Lymphs Abs: 1.3 10*3/uL (ref 0.7–4.0)
MCH: 26.1 pg (ref 26.0–34.0)
MCHC: 31.2 g/dL (ref 30.0–36.0)
MCV: 83.7 fL (ref 80.0–100.0)
Monocytes Absolute: 0.5 10*3/uL (ref 0.1–1.0)
Monocytes Relative: 9 %
Neutro Abs: 3.5 10*3/uL (ref 1.7–7.7)
Neutrophils Relative %: 65 %
Platelets: 307 10*3/uL (ref 150–400)
RBC: 4.1 MIL/uL (ref 3.87–5.11)
RDW: 13.6 % (ref 11.5–15.5)
WBC: 5.4 10*3/uL (ref 4.0–10.5)
nRBC: 0 % (ref 0.0–0.2)

## 2019-08-31 MED ORDER — HEPARIN SOD (PORK) LOCK FLUSH 100 UNIT/ML IV SOLN
500.0000 [IU] | Freq: Once | INTRAVENOUS | Status: AC | PRN
  Administered 2019-08-31: 500 [IU]
  Filled 2019-08-31: qty 5

## 2019-08-31 MED ORDER — ACETAMINOPHEN 325 MG PO TABS
650.0000 mg | ORAL_TABLET | Freq: Once | ORAL | Status: AC
  Administered 2019-08-31: 650 mg via ORAL

## 2019-08-31 MED ORDER — DIPHENHYDRAMINE HCL 25 MG PO CAPS
ORAL_CAPSULE | ORAL | Status: AC
  Filled 2019-08-31: qty 1

## 2019-08-31 MED ORDER — SODIUM CHLORIDE 0.9 % IV SOLN
3.5000 mg/kg | Freq: Once | INTRAVENOUS | Status: AC
  Administered 2019-08-31: 320 mg via INTRAVENOUS
  Filled 2019-08-31: qty 10

## 2019-08-31 MED ORDER — DIPHENHYDRAMINE HCL 25 MG PO CAPS
25.0000 mg | ORAL_CAPSULE | Freq: Once | ORAL | Status: AC
  Administered 2019-08-31: 25 mg via ORAL

## 2019-08-31 MED ORDER — ACETAMINOPHEN 325 MG PO TABS
ORAL_TABLET | ORAL | Status: AC
  Filled 2019-08-31: qty 2

## 2019-08-31 MED ORDER — SODIUM CHLORIDE 0.9 % IV SOLN
Freq: Once | INTRAVENOUS | Status: AC
  Filled 2019-08-31: qty 250

## 2019-08-31 MED ORDER — SODIUM CHLORIDE 0.9% FLUSH
10.0000 mL | INTRAVENOUS | Status: DC | PRN
  Administered 2019-08-31: 10 mL
  Filled 2019-08-31: qty 10

## 2019-08-31 NOTE — Progress Notes (Addendum)
Patient declined 30 minute post observation period. °

## 2019-08-31 NOTE — Patient Instructions (Signed)
[facility] Discharge Instructions for Patients Receiving Chemotherapy  Today you received the following chemotherapy agents Kadcyla  To help prevent nausea and vomiting after your treatment, we encourage you to take your nausea medication as directed   If you develop nausea and vomiting that is not controlled by your nausea medication, call the clinic.   BELOW ARE SYMPTOMS THAT SHOULD BE REPORTED IMMEDIATELY:  *FEVER GREATER THAN 100.5 F  *CHILLS WITH OR WITHOUT FEVER  NAUSEA AND VOMITING THAT IS NOT CONTROLLED WITH YOUR NAUSEA MEDICATION  *UNUSUAL SHORTNESS OF BREATH  *UNUSUAL BRUISING OR BLEEDING  TENDERNESS IN MOUTH AND THROAT WITH OR WITHOUT PRESENCE OF ULCERS  *URINARY PROBLEMS  *BOWEL PROBLEMS  UNUSUAL RASH Items with * indicate a potential emergency and should be followed up as soon as possible.  Feel free to call the clinic should you have any questions or concerns. The clinic phone number is [phone].  Please show the CHEMO ALERT CARD at check-in to the Emergency Department and triage nurse.   

## 2019-09-03 ENCOUNTER — Other Ambulatory Visit: Payer: Self-pay | Admitting: Adult Health

## 2019-09-03 DIAGNOSIS — C50412 Malignant neoplasm of upper-outer quadrant of left female breast: Secondary | ICD-10-CM

## 2019-09-20 ENCOUNTER — Encounter: Payer: Self-pay | Admitting: *Deleted

## 2019-09-21 ENCOUNTER — Inpatient Hospital Stay: Payer: 59

## 2019-09-21 ENCOUNTER — Other Ambulatory Visit: Payer: Self-pay

## 2019-09-21 ENCOUNTER — Inpatient Hospital Stay: Payer: 59 | Attending: Oncology

## 2019-09-21 VITALS — BP 134/85 | HR 86 | Temp 98.2°F | Resp 16

## 2019-09-21 DIAGNOSIS — C50412 Malignant neoplasm of upper-outer quadrant of left female breast: Secondary | ICD-10-CM | POA: Diagnosis not present

## 2019-09-21 DIAGNOSIS — Z170 Estrogen receptor positive status [ER+]: Secondary | ICD-10-CM | POA: Diagnosis not present

## 2019-09-21 DIAGNOSIS — Z5112 Encounter for antineoplastic immunotherapy: Secondary | ICD-10-CM | POA: Diagnosis not present

## 2019-09-21 LAB — COMPREHENSIVE METABOLIC PANEL
ALT: 23 U/L (ref 0–44)
AST: 25 U/L (ref 15–41)
Albumin: 3.6 g/dL (ref 3.5–5.0)
Alkaline Phosphatase: 107 U/L (ref 38–126)
Anion gap: 8 (ref 5–15)
BUN: 16 mg/dL (ref 6–20)
CO2: 27 mmol/L (ref 22–32)
Calcium: 8.8 mg/dL — ABNORMAL LOW (ref 8.9–10.3)
Chloride: 107 mmol/L (ref 98–111)
Creatinine, Ser: 0.76 mg/dL (ref 0.44–1.00)
GFR calc Af Amer: >60 mL/min (ref >60)
GFR calc non Af Amer: >60 mL/min (ref >60)
Glucose, Bld: 110 mg/dL — ABNORMAL HIGH (ref 70–99)
Potassium: 3.9 mmol/L (ref 3.5–5.1)
Sodium: 142 mmol/L (ref 135–145)
Total Bilirubin: 0.2 mg/dL — ABNORMAL LOW (ref 0.3–1.2)
Total Protein: 7.3 g/dL (ref 6.5–8.1)

## 2019-09-21 LAB — CBC WITH DIFFERENTIAL/PLATELET
Abs Immature Granulocytes: 0.01 10*3/uL (ref 0.00–0.07)
Basophils Absolute: 0 10*3/uL (ref 0.0–0.1)
Basophils Relative: 1 %
Eosinophils Absolute: 0.1 10*3/uL (ref 0.0–0.5)
Eosinophils Relative: 2 %
HCT: 34 % — ABNORMAL LOW (ref 36.0–46.0)
Hemoglobin: 10.6 g/dL — ABNORMAL LOW (ref 12.0–15.0)
Immature Granulocytes: 0 %
Lymphocytes Relative: 31 %
Lymphs Abs: 1.4 10*3/uL (ref 0.7–4.0)
MCH: 26.4 pg (ref 26.0–34.0)
MCHC: 31.2 g/dL (ref 30.0–36.0)
MCV: 84.8 fL (ref 80.0–100.0)
Monocytes Absolute: 0.4 10*3/uL (ref 0.1–1.0)
Monocytes Relative: 8 %
Neutro Abs: 2.7 10*3/uL (ref 1.7–7.7)
Neutrophils Relative %: 58 %
Platelets: 294 10*3/uL (ref 150–400)
RBC: 4.01 MIL/uL (ref 3.87–5.11)
RDW: 13.7 % (ref 11.5–15.5)
WBC: 4.5 10*3/uL (ref 4.0–10.5)
nRBC: 0 % (ref 0.0–0.2)

## 2019-09-21 MED ORDER — SODIUM CHLORIDE 0.9 % IV SOLN
Freq: Once | INTRAVENOUS | Status: AC
  Filled 2019-09-21: qty 250

## 2019-09-21 MED ORDER — ACETAMINOPHEN 325 MG PO TABS
ORAL_TABLET | ORAL | Status: AC
  Filled 2019-09-21: qty 2

## 2019-09-21 MED ORDER — DIPHENHYDRAMINE HCL 25 MG PO CAPS
25.0000 mg | ORAL_CAPSULE | Freq: Once | ORAL | Status: AC
  Administered 2019-09-21: 25 mg via ORAL

## 2019-09-21 MED ORDER — ACETAMINOPHEN 325 MG PO TABS
650.0000 mg | ORAL_TABLET | Freq: Once | ORAL | Status: AC
  Administered 2019-09-21: 650 mg via ORAL

## 2019-09-21 MED ORDER — SODIUM CHLORIDE 0.9 % IV SOLN
3.5000 mg/kg | Freq: Once | INTRAVENOUS | Status: AC
  Administered 2019-09-21: 320 mg via INTRAVENOUS
  Filled 2019-09-21: qty 16

## 2019-09-21 MED ORDER — SODIUM CHLORIDE 0.9% FLUSH
10.0000 mL | INTRAVENOUS | Status: DC | PRN
  Administered 2019-09-21: 10 mL
  Filled 2019-09-21: qty 10

## 2019-09-21 MED ORDER — HEPARIN SOD (PORK) LOCK FLUSH 100 UNIT/ML IV SOLN
500.0000 [IU] | Freq: Once | INTRAVENOUS | Status: AC | PRN
  Administered 2019-09-21: 11:00:00 500 [IU]
  Filled 2019-09-21: qty 5

## 2019-09-21 NOTE — Patient Instructions (Signed)
[facility] Discharge Instructions for Patients Receiving Chemotherapy  Today you received the following chemotherapy agents: ado-trastuzumab emtansine.  To help prevent nausea and vomiting after your treatment, we encourage you to take your nausea medication as directed.   If you develop nausea and vomiting that is not controlled by your nausea medication, call the clinic.   BELOW ARE SYMPTOMS THAT SHOULD BE REPORTED IMMEDIATELY:  *FEVER GREATER THAN 100.5 F  *CHILLS WITH OR WITHOUT FEVER  NAUSEA AND VOMITING THAT IS NOT CONTROLLED WITH YOUR NAUSEA MEDICATION  *UNUSUAL SHORTNESS OF BREATH  *UNUSUAL BRUISING OR BLEEDING  TENDERNESS IN MOUTH AND THROAT WITH OR WITHOUT PRESENCE OF ULCERS  *URINARY PROBLEMS  *BOWEL PROBLEMS  UNUSUAL RASH Items with * indicate a potential emergency and should be followed up as soon as possible.  Feel free to call the clinic should you have any questions or concerns. The clinic phone number is [phone].  Please show the CHEMO ALERT CARD at check-in to the Emergency Department and triage nurse.   

## 2019-10-05 DIAGNOSIS — Z01419 Encounter for gynecological examination (general) (routine) without abnormal findings: Secondary | ICD-10-CM | POA: Diagnosis not present

## 2019-10-05 DIAGNOSIS — Z124 Encounter for screening for malignant neoplasm of cervix: Secondary | ICD-10-CM | POA: Diagnosis not present

## 2019-10-06 DIAGNOSIS — Z0000 Encounter for general adult medical examination without abnormal findings: Secondary | ICD-10-CM | POA: Diagnosis not present

## 2019-10-06 DIAGNOSIS — R7309 Other abnormal glucose: Secondary | ICD-10-CM | POA: Diagnosis not present

## 2019-10-06 DIAGNOSIS — C50919 Malignant neoplasm of unspecified site of unspecified female breast: Secondary | ICD-10-CM | POA: Diagnosis not present

## 2019-10-06 DIAGNOSIS — Z13220 Encounter for screening for lipoid disorders: Secondary | ICD-10-CM | POA: Diagnosis not present

## 2019-10-07 ENCOUNTER — Encounter: Payer: Self-pay | Admitting: Oncology

## 2019-10-07 NOTE — Progress Notes (Signed)
Called patient to advise of available copay assistance for Kadcyla through [employer]. Patient gave me permission to enroll in copay program online.  Enrolled patient in copay program. Patient approved for $25,000 with as little as a $5 copay  10/07/19 - 10/06/19 with a retro period date of 04/10/19. She will receive a copy of the approval letter in the mail. A copy will be provided to [facility] for billing/copay submissions.  Patient has my card for any additional financial questions or concerns.

## 2019-10-11 ENCOUNTER — Encounter: Payer: Self-pay | Admitting: *Deleted

## 2019-10-11 NOTE — Progress Notes (Signed)
[facility]  [phone]) [phone] Fax:([phone]) [phone]    ID: Natasha Huffman DOB: 1961-03-05  MR#: [id number]  TXM#:[id number]  Patient Care Team: [name], MD as PCP - General (Family Medicine) Rockwell Germany, RN as Oncology Nurse Navigator Mauro Kaufmann, RN as Oncology Nurse Navigator [name], MD as Consulting Physician (General Surgery) Denvil Canning, [name], MD as Consulting Physician (Oncology) Kyung Rudd, MD as Consulting Physician (Radiation Oncology) Larey Dresser, MD as Consulting Physician (Cardiology) OTHER MD:    CHIEF COMPLAINT: Triple positive breast cancer  CURRENT TREATMENT: TDM-1    INTERVAL HISTORY: Natasha Huffman returns today for follow-up and treatment of her HER-2 positive breast cancer.   She continues on TDM1 therapy. Today is her final dose.  She feels a little bit tired after each dose, but that only lasts a day or 2.  She has had no other side effects from this that she is aware of.  Since her last visit, she underwent repeat echocardiogram on 08/09/2019 showing an ejection fraction of 60-65%.  She started tamoxifen January 1.  She is having minimal hot flashes, no vaginal wetness and no other side effects that she is aware of.   REVIEW OF SYSTEMS: Andreina continues to work full-time.  She is exercising regularly.  She is not having periods.  A detailed review of systems today was otherwise stable.   HISTORY OF CURRENT ILLNESS: From the original intake note:  Natasha Huffman had routine screening mammography on 09/23/2018 showing a possible abnormality in the left breast. She underwent unilateral left diagnostic mammography with tomography and left breast ultrasonography at [facility] on 09/23/2018 showing: Breast Density Category B. There is a mass with irregular margins in the upper-outer quadrant of the left breast. On physical exam, there is palpable focal thickening in the 2 o'clock location of the left breast.  Sonographically, an irregular hypoechoic mass with internal vascularity in the 2 o'clock location of the left breast 10 cm from the nipple is seen. The mass measures 1.8 x 1.1 x 1.0 cm. There is associated posterior acoustic enhancement. Evaluation of the left axilla is negative for adenopathy.  Accordingly on 09/23/2018 she proceeded to biopsy of the left breast area in question. The pathology from this procedure showed ([id number]): invasive ductal carcinoma, grade III. Prognostic indicators significant for: estrogen receptor, 100% positive and progesterone receptor, 2% positive, both with strong staining intensity. Proliferation marker Ki67 at 40%. HER2 positive (3+) by immunohistochemistry.   The patient's subsequent history is as detailed above.   PAST MEDICAL HISTORY: Past Medical History:  Diagnosis Date  . Cancer ([place]) 09/23/2018   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 ,   . Gallstones   . GERD (gastroesophageal reflux disease)    resolved   . Peripheral neuropathy due to chemotherapy [facility])      PAST SURGICAL HISTORY: Past Surgical History:  Procedure Laterality Date  . BREAST LUMPECTOMY WITH RADIOACTIVE SEED AND SENTINEL LYMPH NODE BIOPSY Left 04/08/2019   Procedure: LEFT BREAST RADIOACTIVE SEED LUMPECTOMY X2 AND LEFT SENTINEL LYMPH NODE MAPPING;  Surgeon: [name], MD;  Location: [facility];  Service: General;  Laterality: Left;  . [place]; 1995  . CHOLECYSTECTOMY N/A 02/22/2015   Procedure: LAPAROSCOPIC CHOLECYSTECTOMY WITH INTRAOPERATIVE CHOLANGIOGRAM;  Surgeon: [name], MD;  Location: [facility];  Service: General;  Laterality: N/A;  . LAPAROSCOPIC CHOLECYSTECTOMY  02/22/2015  . PORTACATH PLACEMENT Right 10/08/2018   Procedure: INSERTION PORT-A-CATH WITH ULTRASOUND;  Surgeon: [name], MD;  Location: [facility];  Service: General;  Laterality: Right;  . TUBAL LIGATION  1995     FAMILY HISTORY: Family History  Problem Relation Age of  Onset  . Hypertension Sister   . Colon cancer Maternal Aunt   . Breast cancer Neg Hx    Natasha Huffman's father died from sepsis at age 65. Patients' mother is 74 years old as of 09/2018. The patient has 3 brothers and 5 sisters. Patient denies anyone in her family having breast, ovarian, prostate, or pancreatic cancer. Natasha Huffman's maternal aunt was diagnosed with colon cancer at age 74.    GYNECOLOGIC HISTORY:  No LMP recorded. Patient is postmenopausal. 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. Her husband, [name], works at a [employer] in [place]. 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 Natasha Huffman has two children, Natasha Huffman and Natasha Huffman. Natasha Huffman is 56, lives in [place], [place], and is a Animator. Natasha Huffman is 88, lives in [place], and works for the [employer]. Natasha Huffman has one grandchild. She attends the [employer] in [place]: Natasha Huffman's husband, Natasha Huffman, is automatically her healthcare power of attorney.     HEALTH MAINTENANCE: Social History   Tobacco Use  . Smoking status: Never Smoker  . Smokeless tobacco: Never Used  Substance Use Topics  . Alcohol use: No  . Drug use: No    Colonoscopy: yes, 2015, [facility]  PAP: 09/2017  Bone density: no   No Known Allergies  Current Outpatient Medications  Medication Sig Dispense Refill  . gabapentin (NEURONTIN) 100 MG capsule Take one tablet three times a day with meals and 2 tablets at bedtime 150 capsule 1  . ibuprofen (ADVIL) 800 MG tablet Take 1 tablet (800 mg total) by mouth every 8 (eight) hours as needed. 30 tablet 0  . lidocaine-prilocaine (EMLA) cream Apply to affected area once 30 g 3  . LORazepam (ATIVAN) 0.5 MG tablet TAKE 1 TABLET BY MOUTH AT BEDTIME  AS NEEDEDFOR NAUSEA OR VOMITING 20 tablet 0  . prochlorperazine (COMPAZINE) 10 MG tablet TAKE 1 TABLET(10 MG) BY MOUTH EVERY 6 HOURS AS NEEDED FOR NAUSEA OR VOMITING 30 tablet 1   No current facility-administered medications for this visit.      OBJECTIVE:   Middle-aged African-American woman who appears younger than stated age  3:   10/12/19 1019  BP: (!) 141/75  Pulse: 89  Resp: 18  Temp: 98.5 F (36.9 C)  SpO2: 100%   Body mass index is 36.07 kg/m. Wt Readings from Last 3 Encounters:  10/12/19 197 lb 3.2 oz (89.4 kg)  08/31/19 201 lb (91.2 kg)  07/20/19 196 lb 3.2 oz (89 kg)  ECOG FS: 1  Sclerae unicteric, EOMs intact Wearing a mask No cervical or supraclavicular adenopathy Lungs no rales or rhonchi Heart regular rate and rhythm Abd soft, nontender, positive bowel sounds MSK no focal spinal tenderness, no upper extremity lymphedema Neuro: nonfocal, well oriented, appropriate affect Breasts: The right breast is benign.  Left breast is status post lumpectomy and radiation.  There is still mild hyperpigmentation.  Both axillae are benign.   LAB RESULTS:  CMP     Component Value Date/Time   NA 143 10/12/2019 0950  K 3.7 10/12/2019 0950   CL 108 10/12/2019 0950   CO2 27 10/12/2019 0950   GLUCOSE 117 (H) 10/12/2019 0950   BUN 16 10/12/2019 0950   CREATININE 0.79 10/12/2019 0950   CREATININE 0.71 01/11/2019 0815   CALCIUM 8.8 (L) 10/12/2019 0950   PROT 7.6 10/12/2019 0950   ALBUMIN 3.7 10/12/2019 0950   AST 30 10/12/2019 0950   AST 19 01/11/2019 0815   ALT 26 10/12/2019 0950   ALT 42 01/11/2019 0815   ALKPHOS 106 10/12/2019 0950   BILITOT 0.4 10/12/2019 0950   BILITOT 0.3 01/11/2019 0815   GFRNONAA >60 10/12/2019 0950   GFRNONAA >60 01/11/2019 0815   GFRAA >60 10/12/2019 0950   GFRAA >60 01/11/2019 0815    No results found for: TOTALPROTELP, ALBUMINELP, A1GS, A2GS, BETS, BETA2SER, GAMS, MSPIKE, SPEI  No results found for: KPAFRELGTCHN, LAMBDASER,  [facility]  Lab Results  Component Value Date   WBC 4.7 10/12/2019   NEUTROABS 2.6 10/12/2019   HGB 11.0 (L) 10/12/2019   HCT 35.4 (L) 10/12/2019   MCV 84.7 10/12/2019   PLT 296 10/12/2019   No results found for: LABCA2  No components found for: CZYSAY301  No results for input(s): INR in the last 168 hours.  No results found for: LABCA2  No results found for: SWF093  No results found for: ATF573  No results found for: UKG254  No results found for: CA2729  No components found for: HGQUANT  No results found for: CEA1 / No results found for: CEA1   No results found for: AFPTUMOR  No results found for: CHROMOGRNA  No results found for: PSA1  Appointment on 10/12/2019  Component Date Value Ref Range Status  . Sodium 10/12/2019 143  135 - 145 mmol/L Final  . Potassium 10/12/2019 3.7  3.5 - 5.1 mmol/L Final  . Chloride 10/12/2019 108  98 - 111 mmol/L Final  . CO2 10/12/2019 27  22 - 32 mmol/L Final  . Glucose, Bld 10/12/2019 117* 70 - 99 mg/dL Final   Glucose reference range applies only to samples taken after fasting for at least 8 hours.  . BUN 10/12/2019 16  6 - 20 mg/dL Final  . Creatinine, Ser 10/12/2019 0.79  0.44 - 1.00 mg/dL Final  . Calcium 10/12/2019 8.8* 8.9 - 10.3 mg/dL Final  . Total Protein 10/12/2019 7.6  6.5 - 8.1 g/dL Final  . Albumin 10/12/2019 3.7  3.5 - 5.0 g/dL Final  . AST 10/12/2019 30  15 - 41 U/L Final  . ALT 10/12/2019 26  0 - 44 U/L Final  . Alkaline Phosphatase 10/12/2019 106  38 - 126 U/L Final  . Total Bilirubin 10/12/2019 0.4  0.3 - 1.2 mg/dL Final  . GFR calc non Af Amer 10/12/2019 >60  >60 mL/min Final  . GFR calc Af Amer 10/12/2019 >60  >60 mL/min Final  . Anion gap 10/12/2019 8  5 - 15 Final   Performed at [facility], [place] [address] [place], [place] [address]  . WBC 10/12/2019 4.7  4.0 - 10.5 K/uL Final  . RBC 10/12/2019 4.18  3.87 - 5.11 MIL/uL Final  . Hemoglobin 10/12/2019 11.0* 12.0 - 15.0 g/dL  Final  . HCT 10/12/2019 35.4* 36.0 - 46.0 % Final  . MCV 10/12/2019 84.7  80.0 - 100.0 fL Final  . MCH 10/12/2019 26.3  26.0 - 34.0 pg Final  . MCHC 10/12/2019 31.1  30.0 - 36.0 g/dL Final  . RDW 10/12/2019 13.9  11.5 - 15.5 % Final  . Platelets 10/12/2019 296  150 - 400 K/uL Final  . nRBC 10/12/2019 0.0  0.0 - 0.2 % Final  . Neutrophils Relative % 10/12/2019 56  % Final  . Neutro Abs 10/12/2019 2.6  1.7 - 7.7 K/uL Final  . Lymphocytes Relative 10/12/2019 33  % Final  . Lymphs Abs 10/12/2019 1.5  0.7 - 4.0 K/uL Final  . Monocytes Relative 10/12/2019 9  % Final  . Monocytes Absolute 10/12/2019 0.4  0.1 - 1.0 K/uL Final  . Eosinophils Relative 10/12/2019 2  % Final  . Eosinophils Absolute 10/12/2019 0.1  0.0 - 0.5 K/uL Final  . Basophils Relative 10/12/2019 0  % Final  . Basophils Absolute 10/12/2019 0.0  0.0 - 0.1 K/uL Final  . Immature Granulocytes 10/12/2019 0  % Final  . Abs Immature Granulocytes 10/12/2019 0.01  0.00 - 0.07 K/uL Final   Performed at [facility], [address] [place], [place] [address]    (this displays the last labs from the last 3 days)  No results found for: TOTALPROTELP, ALBUMINELP, A1GS, A2GS, BETS, BETA2SER, GAMS, MSPIKE, SPEI (this displays SPEP labs)  No results found for: KPAFRELGTCHN, LAMBDASER, KAPLAMBRATIO (kappa/lambda light chains)  No results found for: HGBA, HGBA2QUANT, HGBFQUANT, HGBSQUAN (Hemoglobinopathy evaluation)   No results found for: LDH  No results found for: IRON, TIBC, IRONPCTSAT (Iron and TIBC)  No results found for: FERRITIN  Urinalysis    Component Value Date/Time   COLORURINE YELLOW 02/22/2015 0118   APPEARANCEUR CLOUDY (A) 02/22/2015 0118   LABSPEC 1.019 02/22/2015 0118   PHURINE 7.0 02/22/2015 0118   GLUCOSEU NEGATIVE 02/22/2015 0118   HGBUR NEGATIVE 02/22/2015 0118   BILIRUBINUR NEGATIVE 02/22/2015 0118   KETONESUR NEGATIVE 02/22/2015 0118   PROTEINUR NEGATIVE 02/22/2015 0118    UROBILINOGEN 1.0 02/22/2015 0118   NITRITE NEGATIVE 02/22/2015 0118   LEUKOCYTESUR NEGATIVE 02/22/2015 0118    STUDIES:  No results found.   ELIGIBLE FOR AVAILABLE RESEARCH PROTOCOL: no   ASSESSMENT: 59 y.o. [name], [place] woman status post left breast upper outer quadrant biopsy 09/23/2018 for a clinical T1c N0, stage IA invasive ductal carcinoma, grade 3, triple positive, with an MIB-1 of 40%  (1) neoadjuvant chemotherapy consisting of carboplatin, docetaxel, and trastuzumab started 10/22/2018, to be repeated every 21 days x 6  (a) echocardiogram on 10/07/2018 shows a LVEF of 60-65%.  (b) docetaxel discontinued and gemcitabine substituted with cycle 5 because of neuropathy  (2) trastuzumab to continue to complete a year   (a) echocardiogram 01/25/2019 showed an ejection fraction of greater than 65%  (b) trastuzumab changed to T-DM1 post-op  (3) status post left lumpectomy and sentinel lymph node sampling 04/08/2019 showing a residual ypT1c ypN0 residual invasive ductal carcinoma, grade 3, margins negative, again HER-2 positive  (a) 4 lymph nodes were negative  (4) TDM-1 started 05/18/2019 due to residual disease after neoadjuvant chemotherapy  (a) echo on 05/05/2019 shows an EF of 60-65%.  (4) adjuvant radiation 05/12/2019 - 06/25/2019  (a) left breast / 50.4 Gy in 28 fractions  (b) seroma boost / 10 Gy in 5 fractions  (5) started tamoxifen August 13, 2019   PLAN: Akiera completes her TDM 1 treatments today.  She has tolerated these very well.  Likely we should repeat 1 final echo sometime this month.  She started tamoxifen 2 months ago and is tolerating it quite well.  The plan will be to continue that a minimum of 5 years.  I have encouraged her to exercise regularly.  She will receive her COVID-19 vaccines as soon as they become available to her.  I have set her up for mammography and a bone density in May and she will return to see me later that month  I have also  contacted her surgeon so she can have her port removed at his discretion  She knows to call for any other issue that may develop before then  Total encounter time 30 minutes.Sarajane Jews C. Anicia Leuthold, MD  10/12/19 10:51 AM Medical Oncology and Hematology [facility] [place], [place] [address] Tel. [phone]    Fax. [phone]   I, Wilburn Mylar, am acting as scribe for Dr. [name]. Andera Cranmer.  I, Lurline Del MD, have reviewed the above documentation for accuracy and completeness, and I agree with the above.   *Total Encounter Time as defined by the Centers for Medicare and Medicaid Services includes, in addition to the face-to-face time of a patient visit (documented in the note above) non-face-to-face time: obtaining and reviewing outside history, ordering and reviewing medications, tests or procedures, care coordination (communications with other health care professionals or caregivers) and documentation in the medical record.

## 2019-10-12 ENCOUNTER — Inpatient Hospital Stay: Payer: 59

## 2019-10-12 ENCOUNTER — Other Ambulatory Visit: Payer: Self-pay

## 2019-10-12 ENCOUNTER — Inpatient Hospital Stay (HOSPITAL_BASED_OUTPATIENT_CLINIC_OR_DEPARTMENT_OTHER): Payer: 59 | Admitting: Oncology

## 2019-10-12 ENCOUNTER — Inpatient Hospital Stay: Payer: 59 | Attending: Oncology

## 2019-10-12 VITALS — BP 141/75 | HR 89 | Temp 98.5°F | Resp 18 | Ht 62.0 in | Wt 197.2 lb

## 2019-10-12 DIAGNOSIS — C50412 Malignant neoplasm of upper-outer quadrant of left female breast: Secondary | ICD-10-CM

## 2019-10-12 DIAGNOSIS — Z170 Estrogen receptor positive status [ER+]: Secondary | ICD-10-CM | POA: Diagnosis not present

## 2019-10-12 DIAGNOSIS — E109 Type 1 diabetes mellitus without complications: Secondary | ICD-10-CM | POA: Insufficient documentation

## 2019-10-12 DIAGNOSIS — Z791 Long term (current) use of non-steroidal anti-inflammatories (NSAID): Secondary | ICD-10-CM | POA: Insufficient documentation

## 2019-10-12 DIAGNOSIS — K219 Gastro-esophageal reflux disease without esophagitis: Secondary | ICD-10-CM | POA: Insufficient documentation

## 2019-10-12 DIAGNOSIS — Z5112 Encounter for antineoplastic immunotherapy: Secondary | ICD-10-CM | POA: Insufficient documentation

## 2019-10-12 DIAGNOSIS — Z794 Long term (current) use of insulin: Secondary | ICD-10-CM | POA: Diagnosis not present

## 2019-10-12 DIAGNOSIS — Z79810 Long term (current) use of selective estrogen receptor modulators (SERMs): Secondary | ICD-10-CM | POA: Insufficient documentation

## 2019-10-12 DIAGNOSIS — R232 Flushing: Secondary | ICD-10-CM | POA: Diagnosis not present

## 2019-10-12 DIAGNOSIS — Z79899 Other long term (current) drug therapy: Secondary | ICD-10-CM | POA: Insufficient documentation

## 2019-10-12 DIAGNOSIS — Z95828 Presence of other vascular implants and grafts: Secondary | ICD-10-CM

## 2019-10-12 LAB — CBC WITH DIFFERENTIAL/PLATELET
Abs Immature Granulocytes: 0.01 10*3/uL (ref 0.00–0.07)
Basophils Absolute: 0 10*3/uL (ref 0.0–0.1)
Basophils Relative: 0 %
Eosinophils Absolute: 0.1 10*3/uL (ref 0.0–0.5)
Eosinophils Relative: 2 %
HCT: 35.4 % — ABNORMAL LOW (ref 36.0–46.0)
Hemoglobin: 11 g/dL — ABNORMAL LOW (ref 12.0–15.0)
Immature Granulocytes: 0 %
Lymphocytes Relative: 33 %
Lymphs Abs: 1.5 10*3/uL (ref 0.7–4.0)
MCH: 26.3 pg (ref 26.0–34.0)
MCHC: 31.1 g/dL (ref 30.0–36.0)
MCV: 84.7 fL (ref 80.0–100.0)
Monocytes Absolute: 0.4 10*3/uL (ref 0.1–1.0)
Monocytes Relative: 9 %
Neutro Abs: 2.6 10*3/uL (ref 1.7–7.7)
Neutrophils Relative %: 56 %
Platelets: 296 10*3/uL (ref 150–400)
RBC: 4.18 MIL/uL (ref 3.87–5.11)
RDW: 13.9 % (ref 11.5–15.5)
WBC: 4.7 10*3/uL (ref 4.0–10.5)
nRBC: 0 % (ref 0.0–0.2)

## 2019-10-12 LAB — COMPREHENSIVE METABOLIC PANEL
ALT: 26 U/L (ref 0–44)
AST: 30 U/L (ref 15–41)
Albumin: 3.7 g/dL (ref 3.5–5.0)
Alkaline Phosphatase: 106 U/L (ref 38–126)
Anion gap: 8 (ref 5–15)
BUN: 16 mg/dL (ref 6–20)
CO2: 27 mmol/L (ref 22–32)
Calcium: 8.8 mg/dL — ABNORMAL LOW (ref 8.9–10.3)
Chloride: 108 mmol/L (ref 98–111)
Creatinine, Ser: 0.79 mg/dL (ref 0.44–1.00)
GFR calc Af Amer: >60 mL/min (ref >60)
GFR calc non Af Amer: >60 mL/min (ref >60)
Glucose, Bld: 117 mg/dL — ABNORMAL HIGH (ref 70–99)
Potassium: 3.7 mmol/L (ref 3.5–5.1)
Sodium: 143 mmol/L (ref 135–145)
Total Bilirubin: 0.4 mg/dL (ref 0.3–1.2)
Total Protein: 7.6 g/dL (ref 6.5–8.1)

## 2019-10-12 MED ORDER — DIPHENHYDRAMINE HCL 25 MG PO CAPS
ORAL_CAPSULE | ORAL | Status: AC
  Filled 2019-10-12: qty 1

## 2019-10-12 MED ORDER — ACETAMINOPHEN 325 MG PO TABS
650.0000 mg | ORAL_TABLET | Freq: Once | ORAL | Status: AC
  Administered 2019-10-12: 650 mg via ORAL

## 2019-10-12 MED ORDER — SODIUM CHLORIDE 0.9% FLUSH
10.0000 mL | INTRAVENOUS | Status: DC | PRN
  Administered 2019-10-12: 10 mL
  Filled 2019-10-12: qty 10

## 2019-10-12 MED ORDER — ACETAMINOPHEN 325 MG PO TABS
ORAL_TABLET | ORAL | Status: AC
  Filled 2019-10-12: qty 2

## 2019-10-12 MED ORDER — DIPHENHYDRAMINE HCL 25 MG PO CAPS
25.0000 mg | ORAL_CAPSULE | Freq: Once | ORAL | Status: AC
  Administered 2019-10-12: 25 mg via ORAL

## 2019-10-12 MED ORDER — SODIUM CHLORIDE 0.9 % IV SOLN
3.5000 mg/kg | Freq: Once | INTRAVENOUS | Status: AC
  Administered 2019-10-12: 320 mg via INTRAVENOUS
  Filled 2019-10-12: qty 16

## 2019-10-12 MED ORDER — SODIUM CHLORIDE 0.9 % IV SOLN
Freq: Once | INTRAVENOUS | Status: AC
  Filled 2019-10-12: qty 250

## 2019-10-12 MED ORDER — HEPARIN SOD (PORK) LOCK FLUSH 100 UNIT/ML IV SOLN
500.0000 [IU] | Freq: Once | INTRAVENOUS | Status: AC | PRN
  Administered 2019-10-12: 500 [IU]
  Filled 2019-10-12: qty 5

## 2019-10-12 NOTE — Patient Instructions (Signed)

## 2019-10-12 NOTE — Progress Notes (Signed)
Patient declined to stay for 44min observation time post Kadcyla infusion. Ambulated out of clinic without incident

## 2019-10-12 NOTE — Patient Instructions (Signed)
COVID-19 Vaccine Information can be found at: [email or web address] For questions related to vaccine distribution or appointments, please email [email or web address] or call [phone].   [facility] Discharge Instructions for Patients Receiving Chemotherapy  Today you received the following chemotherapy agents: Ado-Trastuzumab Emtansine (Kadcyla)  To help prevent nausea and vomiting after your treatment, we encourage you to take your nausea medication as directed by your provider.   If you develop nausea and vomiting that is not controlled by your nausea medication, call the clinic.   BELOW ARE SYMPTOMS THAT SHOULD BE REPORTED IMMEDIATELY:  *FEVER GREATER THAN 100.5 F  *CHILLS WITH OR WITHOUT FEVER  NAUSEA AND VOMITING THAT IS NOT CONTROLLED WITH YOUR NAUSEA MEDICATION  *UNUSUAL SHORTNESS OF BREATH  *UNUSUAL BRUISING OR BLEEDING  TENDERNESS IN MOUTH AND THROAT WITH OR WITHOUT PRESENCE OF ULCERS  *URINARY PROBLEMS  *BOWEL PROBLEMS  UNUSUAL RASH Items with * indicate a potential emergency and should be followed up as soon as possible.  Feel free to call the clinic should you have any questions or concerns. The clinic phone number is [phone].  Please show the [facility] at check-in to the Emergency Department and triage nurse.  Coronavirus (COVID-19) Are you at risk?  Are you at risk for the Coronavirus (COVID-19)?  To be considered HIGH RISK for Coronavirus (COVID-19), you have to meet the following criteria:  . Traveled to [place], [place], [place], [place] or [place]; or in the [place] to [place], [place], [place], or [place]; and have fever, cough, and shortness of breath within the last 2 weeks of travel OR . Been in close contact with a person diagnosed with COVID-19 within the last 2 weeks and have fever, cough, and shortness of breath . IF YOU DO NOT MEET THESE CRITERIA,  YOU ARE CONSIDERED LOW RISK FOR COVID-19.  What to do if you are HIGH RISK for COVID-19?  Marland Kitchen If you are having a medical emergency, call 911. . Seek medical care right away. Before you go to a doctor's office, urgent care or emergency department, call ahead and tell them about your recent travel, contact with someone diagnosed with COVID-19, and your symptoms. You should receive instructions from your physician's office regarding next steps of care.  . When you arrive at healthcare provider, tell the healthcare staff immediately you have returned from visiting [place], [place], [place], [place] or [place]; or traveled in the [place] to [place], [place], [place], or [place]; in the last two weeks or you have been in close contact with a person diagnosed with COVID-19 in the last 2 weeks.   . Tell the health care staff about your symptoms: fever, cough and shortness of breath. . After you have been seen by a medical provider, you will be either: o Tested for (COVID-19) and discharged home on quarantine except to seek medical care if symptoms worsen, and asked to  - Stay home and avoid contact with others until you get your results (4-5 days)  - Avoid travel on public transportation if possible (such as bus, train, or airplane) or o Sent to the Emergency Department by EMS for evaluation, COVID-19 testing, and possible admission depending on your condition and test results.  What to do if you are LOW RISK for COVID-19?  Reduce your risk of any infection by using the same precautions used for avoiding the common cold or flu:  Marland Kitchen Wash your hands often with soap and warm water for  at least 20 seconds.  If soap and water are not readily available, use an alcohol-based hand sanitizer with at least 60% alcohol.  . If coughing or sneezing, cover your mouth and nose by coughing or sneezing into the elbow areas of your shirt or coat, into a tissue or into your sleeve (not your hands). . Avoid shaking  hands with others and consider head nods or verbal greetings only. . Avoid touching your eyes, nose, or mouth with unwashed hands.  . Avoid close contact with people who are sick. . Avoid places or events with large numbers of people in one location, like concerts or sporting events. . Carefully consider travel plans you have or are making. . If you are planning any travel outside or inside the [place], visit the CDC's Travelers' Health webpage for the latest health notices. . If you have some symptoms but not all symptoms, continue to monitor at home and seek medical attention if your symptoms worsen. . If you are having a medical emergency, call 911.   [place] / e-Visit: [email or web address]         [facility]: [facility]: [phone]                   [facility]: [phone]

## 2019-10-13 ENCOUNTER — Telehealth: Payer: Self-pay | Admitting: Oncology

## 2019-10-13 NOTE — Telephone Encounter (Signed)
I talk with patient regarding schedule  

## 2019-10-18 ENCOUNTER — Encounter: Payer: Self-pay | Admitting: *Deleted

## 2019-11-01 MED FILL — TAMOXIFEN 20 MG TABLET: 20 | 90 days supply | Qty: 90 | Fill #1

## 2019-11-24 ENCOUNTER — Other Ambulatory Visit: Payer: Self-pay

## 2019-11-24 ENCOUNTER — Ambulatory Visit (HOSPITAL_COMMUNITY): Payer: 59 | Attending: Cardiovascular Disease

## 2019-11-24 DIAGNOSIS — C50412 Malignant neoplasm of upper-outer quadrant of left female breast: Secondary | ICD-10-CM | POA: Diagnosis not present

## 2019-11-24 DIAGNOSIS — Z170 Estrogen receptor positive status [ER+]: Secondary | ICD-10-CM

## 2019-11-24 MED ORDER — PERFLUTREN LIPID MICROSPHERE
1.0000 mL | INTRAVENOUS | Status: AC | PRN
  Administered 2019-11-24: 2 mL via INTRAVENOUS

## 2019-12-01 ENCOUNTER — Ambulatory Visit: Payer: Self-pay | Admitting: Surgery

## 2019-12-02 MED FILL — diazePAM 5 MG TABS: 5 | 15 days supply | Qty: 30 | Fill #0

## 2019-12-15 ENCOUNTER — Other Ambulatory Visit: Payer: Self-pay

## 2019-12-15 ENCOUNTER — Ambulatory Visit
Admission: RE | Admit: 2019-12-15 | Discharge: 2019-12-15 | Disposition: A | Discharge status: Home / Self Care | Payer: 59 | Source: Ambulatory Visit | Attending: Adult Health | Admitting: Adult Health

## 2019-12-15 DIAGNOSIS — Z853 Personal history of malignant neoplasm of breast: Secondary | ICD-10-CM | POA: Diagnosis not present

## 2019-12-15 DIAGNOSIS — R928 Other abnormal and inconclusive findings on diagnostic imaging of breast: Secondary | ICD-10-CM | POA: Diagnosis not present

## 2019-12-15 DIAGNOSIS — C50412 Malignant neoplasm of upper-outer quadrant of left female breast: Secondary | ICD-10-CM

## 2019-12-31 ENCOUNTER — Other Ambulatory Visit (HOSPITAL_COMMUNITY): Payer: 59

## 2020-01-04 NOTE — Progress Notes (Signed)
  [facility]  [phone] [phone]    ID: Natasha Huffman DOB: 10/01/1960  MR#: [id number]  CSN#:[id number]  Patient Care Team: Ehinger, Robert, MD as PCP - General (Family Medicine) Martini, Keisha N, RN as Oncology Nurse Navigator Stuart, Dawn C, RN as Oncology Nurse Navigator Cornett, Thomas, MD as Consulting Physician (General Surgery) [name], Gustav C, MD as Consulting Physician (Oncology) Moody, John, MD as Consulting Physician (Radiation Oncology) McLean, Dalton S, MD as Consulting Physician (Cardiology) OTHER MD:    CHIEF COMPLAINT: Triple positive breast cancer  CURRENT TREATMENT: Tamoxifen   INTERVAL HISTORY: Natasha Huffman returns today for follow-up of her HER-2 positive breast cancer.   She continues on tamoxifen. She is having minimal hot flashes, minimal vaginal wetness and no other side effects that she is aware of.  Since her last visit, she underwent repeat echocardiogram on 11/24/2019 showing an ejection fraction of 55-60%.  She also underwent bilateral diagnostic mammography with tomography at [facility] on 12/15/2019 showing: breast density category B; no evidence of malignancy in either breast.   She is scheduled for port removal on 02/03/2020.   REVIEW OF SYSTEMS: Solara is doing "great".  She and everybody in her family have had the vaccine except a very younger son who will have the second dose of his vaccine on 01/17/2020.  She is back to work and stressing because of her recertification exam in July.  She is not exercising regularly but "is trying to walk" and is planning to join a gym.  Her hair has come back nice and thick with no bald spots.  A detailed review of systems today was otherwise stable   HISTORY OF CURRENT ILLNESS: From the original intake note:  [name] had routine screening mammography on 09/23/2018 showing a possible abnormality in the left breast. She underwent unilateral left diagnostic  mammography with tomography and left breast ultrasonography at [facility] on 09/23/2018 showing: Breast Density Category B. There is a mass with irregular margins in the upper-outer quadrant of the left breast. On physical exam, there is palpable focal thickening in the 2 o'clock location of the left breast. Sonographically, an irregular hypoechoic mass with internal vascularity in the 2 o'clock location of the left breast 10 cm from the nipple is seen. The mass measures 1.8 x 1.1 x 1.0 cm. There is associated posterior acoustic enhancement. Evaluation of the left axilla is negative for adenopathy.  Accordingly on 09/23/2018 she proceeded to biopsy of the left breast area in question. The pathology from this procedure showed ([id number]): invasive ductal carcinoma, grade III. Prognostic indicators significant for: estrogen receptor, 100% positive and progesterone receptor, 2% positive, both with strong staining intensity. Proliferation marker Ki67 at 40%. HER2 positive (3+) by immunohistochemistry.   The patient's subsequent history is as detailed above.   PAST MEDICAL HISTORY: Past Medical History:  Diagnosis Date  . Breast cancer (HCC) 2020   Left Breast Cancer  . Cancer (HCC) 09/23/2018   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 ,   . Gallstones   . GERD (gastroesophageal reflux disease)    resolved   . Peripheral neuropathy due to chemotherapy (HCC)   . Personal history of chemotherapy 2020   Left Breast Cancer  . Personal history of radiation therapy 2020   Left Breast Cancer     PAST SURGICAL HISTORY: Past Surgical History:  Procedure Laterality Date  . BREAST EXCISIONAL BIOPSY Left 04/08/2019  . BREAST LUMPECTOMY Left 04/08/2019  . BREAST   LUMPECTOMY WITH RADIOACTIVE SEED AND SENTINEL LYMPH NODE BIOPSY Left 04/08/2019   Procedure: LEFT BREAST RADIOACTIVE SEED LUMPECTOMY X2 AND LEFT SENTINEL LYMPH NODE MAPPING;  Surgeon: Cornett, Thomas, MD;  Location: [facility];   Service: General;  Laterality: Left;  . CESAREAN SECTION  1990; 1995  . CHOLECYSTECTOMY N/A 02/22/2015   Procedure: LAPAROSCOPIC CHOLECYSTECTOMY WITH INTRAOPERATIVE CHOLANGIOGRAM;  Surgeon: Douglas Blackman, MD;  Location: [facility] OR;  Service: General;  Laterality: N/A;  . LAPAROSCOPIC CHOLECYSTECTOMY  02/22/2015  . PORTACATH PLACEMENT Right 10/08/2018   Procedure: INSERTION PORT-A-CATH WITH ULTRASOUND;  Surgeon: Cornett, Thomas, MD;  Location: [facility] OR;  Service: General;  Laterality: Right;  . TUBAL LIGATION  1995     FAMILY HISTORY: Family History  Problem Relation Age of Onset  . Hypertension Sister   . Colon cancer Maternal Aunt   . Breast cancer Neg Hx    Cathe's father died from sepsis at age 58. Patients' mother is 75 years old as of 09/2018. The patient has 3 brothers and 5 sisters. Patient denies anyone in her family having breast, ovarian, prostate, or pancreatic cancer. [name]'s maternal aunt was diagnosed with colon cancer at age 59.    GYNECOLOGIC HISTORY:  No LMP recorded. Patient is postmenopausal. 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 Her husband, [name], works at a [employer] in [place]. 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 [name] has two children, [name] and [name]. [name] is 24, lives in [place], [place], and is a Residential Counselor. [name] is 30, lives in [place], and works for the [employer]. [name] has one grandchild. She attends the [employer] in [place].   ADVANCED DIRECTIVES: [name]'s husband, [name], is automatically her healthcare power of attorney.     HEALTH MAINTENANCE: Social History   Tobacco Use  . Smoking status: Never Smoker  . Smokeless tobacco: Never Used  Substance Use Topics   . Alcohol use: No  . Drug use: No    Colonoscopy: yes, 2015, [facility]  PAP: 09/2017  Bone density: no   No Known Allergies  Current Outpatient Medications  Medication Sig Dispense Refill  . bisacodyl (DULCOLAX) 5 MG EC tablet Take 5 mg by mouth daily as needed.    . gabapentin (NEURONTIN) 100 MG capsule Take one tablet three times a day with meals and 2 tablets at bedtime 150 capsule 1  . ibuprofen (ADVIL) 800 MG tablet Take 1 tablet (800 mg total) by mouth every 8 (eight) hours as needed. 30 tablet 0  . lidocaine-prilocaine (EMLA) cream Apply to affected area once 30 g 3  . LORazepam (ATIVAN) 0.5 MG tablet TAKE 1 TABLET BY MOUTH AT BEDTIME AS NEEDEDFOR NAUSEA OR VOMITING 20 tablet 0  . prochlorperazine (COMPAZINE) 10 MG tablet TAKE 1 TABLET(10 MG) BY MOUTH EVERY 6 HOURS AS NEEDED FOR NAUSEA OR VOMITING 30 tablet 1  . tamoxifen (NOLVADEX) 20 MG tablet Take 20 mg by mouth daily.     No current facility-administered medications for this visit.      OBJECTIVE:  African-American woman in no acute distress  Vitals:   01/05/20 1248  BP: (!) 134/57  Pulse: 81  Resp: 20  Temp: 98 F (36.7 C)  SpO2: 100%   Body mass   index is 35.81 kg/m. Wt Readings from Last 3 Encounters:  01/05/20 195 lb 12.8 oz (88.8 kg)  10/12/19 197 lb 3.2 oz (89.4 kg)  08/31/19 201 lb (91.2 kg)  ECOG FS: 1  Sclerae unicteric, EOMs intact Wearing a mask No cervical or supraclavicular adenopathy Lungs no rales or rhonchi Heart regular rate and rhythm Abd soft, nontender, positive bowel sounds MSK no focal spinal tenderness, no upper extremity lymphedema; fair range of motion left upper extremity Neuro: nonfocal, well oriented, appropriate affect Breasts: The right breast is unremarkable.  The left breast is status post lumpectomy and radiation.  There is no evidence of local recurrence.  There is mild hyperpigmentation.  Both axillae are benign.  LAB RESULTS:  CMP     Component Value Date/Time   NA  141 01/05/2020 1216   K 4.4 01/05/2020 1216   CL 105 01/05/2020 1216   CO2 29 01/05/2020 1216   GLUCOSE 96 01/05/2020 1216   BUN 12 01/05/2020 1216   CREATININE 0.79 01/05/2020 1216   CREATININE 0.71 01/11/2019 0815   CALCIUM 9.5 01/05/2020 1216   PROT 7.8 01/05/2020 1216   ALBUMIN 3.8 01/05/2020 1216   AST 30 01/05/2020 1216   AST 19 01/11/2019 0815   ALT 33 01/05/2020 1216   ALT 42 01/11/2019 0815   ALKPHOS 99 01/05/2020 1216   BILITOT 0.3 01/05/2020 1216   BILITOT 0.3 01/11/2019 0815   GFRNONAA >60 01/05/2020 1216   GFRNONAA >60 01/11/2019 0815   GFRAA >60 01/05/2020 1216   GFRAA >60 01/11/2019 0815    No results found for: TOTALPROTELP, ALBUMINELP, A1GS, A2GS, BETS, BETA2SER, GAMS, MSPIKE, SPEI  No results found for: KPAFRELGTCHN, LAMBDASER, KAPLAMBRATIO  Lab Results  Component Value Date   WBC 4.6 01/05/2020   NEUTROABS 2.3 01/05/2020   HGB 12.3 01/05/2020   HCT 40.1 01/05/2020   MCV 85.5 01/05/2020   PLT 247 01/05/2020   No results found for: LABCA2  No components found for: LABCAN125  No results for input(s): INR in the last 168 hours.  No results found for: LABCA2  No results found for: CAN199  No results found for: CAN125  No results found for: CAN153  No results found for: CA2729  No components found for: HGQUANT  No results found for: CEA1 / No results found for: CEA1   No results found for: AFPTUMOR  No results found for: CHROMOGRNA  No results found for: HGBA, HGBA2QUANT, HGBFQUANT, HGBSQUAN (Hemoglobinopathy evaluation)   No results found for: LDH  No results found for: IRON, TIBC, IRONPCTSAT (Iron and TIBC)  No results found for: FERRITIN  Urinalysis    Component Value Date/Time   COLORURINE YELLOW 02/22/2015 0118   APPEARANCEUR CLOUDY (A) 02/22/2015 0118   LABSPEC 1.019 02/22/2015 0118   PHURINE 7.0 02/22/2015 0118   GLUCOSEU NEGATIVE 02/22/2015 0118   HGBUR NEGATIVE 02/22/2015 0118   BILIRUBINUR NEGATIVE 02/22/2015 0118    KETONESUR NEGATIVE 02/22/2015 0118   PROTEINUR NEGATIVE 02/22/2015 0118   UROBILINOGEN 1.0 02/22/2015 0118   NITRITE NEGATIVE 02/22/2015 0118   LEUKOCYTESUR NEGATIVE 02/22/2015 0118    STUDIES:  MM DIAG BREAST TOMO BILATERAL  Result Date: 12/15/2019 CLINICAL DATA:  History of left breast cancer status post lumpectomy, chemotherapy and radiation therapy in 2020. Patient also had excisional biopsy of a papilloma in the medial aspect of the left breast. EXAM: DIGITAL DIAGNOSTIC BILATERAL MAMMOGRAM WITH CAD AND TOMO COMPARISON:  Previous exam(s). ACR Breast Density Category b: There are scattered areas of fibroglandular   density. FINDINGS: Lumpectomy changes are seen in the left breast. No suspicious mass or malignant type microcalcifications identified in either breast. Mammographic images were processed with CAD. IMPRESSION: No evidence of malignancy in either breast. RECOMMENDATION: Bilateral diagnostic mammogram in 1 year is recommended. I have discussed the findings and recommendations with the patient. If applicable, a reminder letter will be sent to the patient regarding the next appointment. BI-RADS CATEGORY  2: Benign. Electronically Signed   By: Lillia Mountain M.D.   On: 12/15/2019 09:55     ELIGIBLE FOR AVAILABLE RESEARCH PROTOCOL: no   ASSESSMENT: 59 y.o. [name], [place] woman status post left breast upper outer quadrant biopsy 09/23/2018 for a clinical T1c N0, stage IA invasive ductal carcinoma, grade 3, triple positive, with an MIB-1 of 40%  (1) neoadjuvant chemotherapy consisting of carboplatin, docetaxel, and trastuzumab started 10/22/2018, to be repeated every 21 days x 6  (a) echocardiogram on 10/07/2018 shows a LVEF of 60-65%.  (b) docetaxel discontinued and gemcitabine substituted with cycle 5 because of neuropathy  (2) trastuzumab to continue to complete a year   (a) echocardiogram 01/25/2019 showed an ejection fraction of greater than 65%  (b) trastuzumab changed to T-DM1  post-op  (3) status post left lumpectomy and sentinel lymph node sampling 04/08/2019 showing a residual ypT1c ypN0 residual invasive ductal carcinoma, grade 3, margins negative, again HER-2 positive  (a) 4 lymph nodes were negative  (4) TDM-1 started 05/18/2019 due to residual disease after neoadjuvant chemotherapy  (a) echo on 05/05/2019 shows an EF of 60-65%.  (4) adjuvant radiation 05/12/2019 - 06/25/2019  (a) left breast / 50.4 Gy in 28 fractions  (b) seroma boost / 10 Gy in 5 fractions  (5) started tamoxifen August 13, 2019   PLAN: Laren is now close to a year out from definitive surgery for her breast cancer with no evidence of disease recurrence.  This is very low.  She is tolerating tamoxifen well and the plan is to continue that a minimum of 5 years, for likely 10 years.  I think she would benefit from physical therapy for range of motion and to learn about lymphedema place that referral for her.  I gave her information on the finding of new normal group which I think she would enjoy  Otherwise she will have her port removed in June, she will see her gynecologist and primary care physician as scheduled, she will follow-up with Dr. Brantley Stage early next year, and she will see me again late May 2022  She knows to call for any other issue that may develop before then  Total encounter time 30 minutes.Sarajane Jews C. [name], MD  01/05/20 1:24 PM Medical Oncology and Hematology [facility] [place], [place] [address] Tel. [phone]    Fax. [phone]   I, Wilburn Mylar, am acting as scribe for Dr. [name]. [name].  I, Lurline Del MD, have reviewed the above documentation for accuracy and completeness, and I agree with the above.   *Total Encounter Time as defined by the Centers for Medicare and Medicaid Services includes, in addition to the face-to-face time of a patient visit (documented in the note above) non-face-to-face time:  obtaining and reviewing outside history, ordering and reviewing medications, tests or procedures, care coordination (communications with other health care professionals or caregivers) and documentation in the medical record.

## 2020-01-05 ENCOUNTER — Inpatient Hospital Stay: Payer: 59 | Attending: Oncology | Admitting: Oncology

## 2020-01-05 ENCOUNTER — Other Ambulatory Visit: Payer: Self-pay

## 2020-01-05 ENCOUNTER — Inpatient Hospital Stay: Payer: 59

## 2020-01-05 VITALS — BP 134/57 | HR 81 | Temp 98.0°F | Resp 20 | Ht 62.0 in | Wt 195.8 lb

## 2020-01-05 DIAGNOSIS — K219 Gastro-esophageal reflux disease without esophagitis: Secondary | ICD-10-CM | POA: Insufficient documentation

## 2020-01-05 DIAGNOSIS — C50412 Malignant neoplasm of upper-outer quadrant of left female breast: Secondary | ICD-10-CM | POA: Insufficient documentation

## 2020-01-05 DIAGNOSIS — E109 Type 1 diabetes mellitus without complications: Secondary | ICD-10-CM | POA: Insufficient documentation

## 2020-01-05 DIAGNOSIS — Z170 Estrogen receptor positive status [ER+]: Secondary | ICD-10-CM | POA: Insufficient documentation

## 2020-01-05 DIAGNOSIS — T451X5A Adverse effect of antineoplastic and immunosuppressive drugs, initial encounter: Secondary | ICD-10-CM

## 2020-01-05 DIAGNOSIS — G620 Drug-induced polyneuropathy: Secondary | ICD-10-CM | POA: Diagnosis not present

## 2020-01-05 DIAGNOSIS — Z79899 Other long term (current) drug therapy: Secondary | ICD-10-CM | POA: Insufficient documentation

## 2020-01-05 DIAGNOSIS — Z791 Long term (current) use of non-steroidal anti-inflammatories (NSAID): Secondary | ICD-10-CM | POA: Diagnosis not present

## 2020-01-05 DIAGNOSIS — D701 Agranulocytosis secondary to cancer chemotherapy: Secondary | ICD-10-CM

## 2020-01-05 DIAGNOSIS — Z923 Personal history of irradiation: Secondary | ICD-10-CM | POA: Insufficient documentation

## 2020-01-05 DIAGNOSIS — Z79810 Long term (current) use of selective estrogen receptor modulators (SERMs): Secondary | ICD-10-CM | POA: Insufficient documentation

## 2020-01-05 DIAGNOSIS — Z9221 Personal history of antineoplastic chemotherapy: Secondary | ICD-10-CM | POA: Diagnosis not present

## 2020-01-05 DIAGNOSIS — R232 Flushing: Secondary | ICD-10-CM | POA: Insufficient documentation

## 2020-01-05 DIAGNOSIS — Z794 Long term (current) use of insulin: Secondary | ICD-10-CM | POA: Insufficient documentation

## 2020-01-05 LAB — COMPREHENSIVE METABOLIC PANEL
ALT: 33 U/L (ref 0–44)
AST: 30 U/L (ref 15–41)
Albumin: 3.8 g/dL (ref 3.5–5.0)
Alkaline Phosphatase: 99 U/L (ref 38–126)
Anion gap: 7 (ref 5–15)
BUN: 12 mg/dL (ref 6–20)
CO2: 29 mmol/L (ref 22–32)
Calcium: 9.5 mg/dL (ref 8.9–10.3)
Chloride: 105 mmol/L (ref 98–111)
Creatinine, Ser: 0.79 mg/dL (ref 0.44–1.00)
GFR calc Af Amer: >60 mL/min (ref >60)
GFR calc non Af Amer: >60 mL/min (ref >60)
Glucose, Bld: 96 mg/dL (ref 70–99)
Potassium: 4.4 mmol/L (ref 3.5–5.1)
Sodium: 141 mmol/L (ref 135–145)
Total Bilirubin: 0.3 mg/dL (ref 0.3–1.2)
Total Protein: 7.8 g/dL (ref 6.5–8.1)

## 2020-01-05 LAB — CBC WITH DIFFERENTIAL/PLATELET
Abs Immature Granulocytes: 0.01 10*3/uL (ref 0.00–0.07)
Basophils Absolute: 0 10*3/uL (ref 0.0–0.1)
Basophils Relative: 1 %
Eosinophils Absolute: 0.1 10*3/uL (ref 0.0–0.5)
Eosinophils Relative: 3 %
HCT: 40.1 % (ref 36.0–46.0)
Hemoglobin: 12.3 g/dL (ref 12.0–15.0)
Immature Granulocytes: 0 %
Lymphocytes Relative: 38 %
Lymphs Abs: 1.7 10*3/uL (ref 0.7–4.0)
MCH: 26.2 pg (ref 26.0–34.0)
MCHC: 30.7 g/dL (ref 30.0–36.0)
MCV: 85.5 fL (ref 80.0–100.0)
Monocytes Absolute: 0.4 10*3/uL (ref 0.1–1.0)
Monocytes Relative: 9 %
Neutro Abs: 2.3 10*3/uL (ref 1.7–7.7)
Neutrophils Relative %: 49 %
Platelets: 247 10*3/uL (ref 150–400)
RBC: 4.69 MIL/uL (ref 3.87–5.11)
RDW: 14.4 % (ref 11.5–15.5)
WBC: 4.6 10*3/uL (ref 4.0–10.5)
nRBC: 0 % (ref 0.0–0.2)

## 2020-01-06 ENCOUNTER — Telehealth: Payer: Self-pay | Admitting: Oncology

## 2020-01-06 NOTE — Telephone Encounter (Signed)
Scheduled appts per 5/26 los. Pt confirmed appt date and time.

## 2020-01-20 ENCOUNTER — Ambulatory Visit: Payer: 59 | Attending: Oncology

## 2020-01-20 ENCOUNTER — Other Ambulatory Visit: Payer: Self-pay

## 2020-01-20 DIAGNOSIS — Z170 Estrogen receptor positive status [ER+]: Secondary | ICD-10-CM | POA: Diagnosis not present

## 2020-01-20 DIAGNOSIS — C50412 Malignant neoplasm of upper-outer quadrant of left female breast: Secondary | ICD-10-CM | POA: Diagnosis not present

## 2020-01-20 DIAGNOSIS — R293 Abnormal posture: Secondary | ICD-10-CM | POA: Insufficient documentation

## 2020-01-20 DIAGNOSIS — M25612 Stiffness of left shoulder, not elsewhere classified: Secondary | ICD-10-CM | POA: Insufficient documentation

## 2020-01-20 NOTE — Therapy (Signed)
[place], [place], [address] Phone: [phone]   Fax:  [phone]  Physical Therapy Evaluation  Patient Details  Name: Natasha Huffman MRN: [id number] Date of Birth: 08-26-1960 Referring Provider (PT): Lurline Del MD   Encounter Date: 01/20/2020   PT End of Session - 01/20/20 0918    Visit Number 1    Number of Visits 5    Date for PT Re-Evaluation 02/17/20    PT Start Time 0807    PT Stop Time 0900    PT Time Calculation (min) 53 min    Activity Tolerance Patient tolerated treatment well    Behavior During Therapy [facility] for tasks assessed/performed           Past Medical History:  Diagnosis Date  . Breast cancer ([place]) 2020   Left Breast Cancer  . 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 Gallstones   . GERD (gastroesophageal reflux disease)    resolved   . Peripheral neuropathy due to chemotherapy ([place])   . Personal history of chemotherapy 2020   Left Breast Cancer  . Personal history of radiation therapy 2020   Left Breast Cancer    Past Surgical History:  Procedure Laterality Date  . BREAST EXCISIONAL BIOPSY Left 04/08/2019  . BREAST LUMPECTOMY Left 04/08/2019  . BREAST LUMPECTOMY WITH RADIOACTIVE SEED AND SENTINEL LYMPH NODE BIOPSY Left 04/08/2019   Procedure: LEFT BREAST RADIOACTIVE SEED LUMPECTOMY X2 AND LEFT SENTINEL LYMPH NODE MAPPING;  Surgeon: [name], MD;  Location: [place];  Service: General;  Laterality: Left;  . [place]; 1995  . CHOLECYSTECTOMY N/A 02/22/2015   Procedure: LAPAROSCOPIC CHOLECYSTECTOMY WITH INTRAOPERATIVE CHOLANGIOGRAM;  Surgeon: [name], MD;  Location: [place];  Service: General;  Laterality: N/A;  . LAPAROSCOPIC CHOLECYSTECTOMY  02/22/2015  . PORTACATH PLACEMENT Right 10/08/2018   Procedure: INSERTION PORT-A-CATH WITH ULTRASOUND;  Surgeon: [name], MD;  Location: [place];   Service: General;  Laterality: Right;  . TUBAL LIGATION  1995    There were no vitals filed for this visit.    Subjective Assessment - 01/20/20 0812    Subjective Pt states that she has less range of motion in her L shoulder than she has in her R shoulder that has been going on since surgery. She states that she does not have much pain. She only notices in bed when she tries to slide over and will feel a pulling in her L axilla. She states that she has not noticed any swelling.    Pertinent History invasive ductal carcinoma, grade III. Prognostic indicators significant for: estrogen receptor, 100% positive and progesterone receptor, 2% positive, both with strong staining intensity. Proliferation marker Ki67 at 40%. HER2 positive (3+)  04/08/2019 L lumpectomy with 4 lymph node removal all were negative. She has currently finished radiation and chemotherapy    Patient Stated Goals I want to get my range back in my L arm.    Currently in Pain? No/denies              [facility] PT Assessment - 01/20/20 0001      Assessment   Medical Diagnosis L breast cancer    Referring Provider (PT) Lurline Del MD    Onset Date/Surgical Date 04/08/19    Hand Dominance Right    Prior Therapy None      Precautions   Precautions Other (comment)    Precaution  Comments cancer, risk for lymphedema       Balance Screen   Has the patient fallen in the past 6 months No    Has the patient had a decrease in activity level because of a fear of falling?  No    Is the patient reluctant to leave their home because of a fear of falling?  No      Home Environment   Living Environment Private residence    Living Arrangements Spouse/significant other;Children    Type of [place] to enter    Home Layout Two level    Additional Comments Pt does not report difficulty ascending/descending the stairs      Prior Function   Level of Independence Independent    Vocation Full time employment     Vocation Requirements Pt has to be on the computer a lot she works on [employer] for [employer], skating, travel       Cognition   Overall Cognitive Status Within Functional Limits for tasks assessed      Posture/Postural Control   Posture/Postural Control Postural limitations    Postural Limitations Rounded Shoulders;Forward head      ROM / Strength   AROM / PROM / Strength AROM      AROM   AROM Assessment Site Shoulder    Right/Left Shoulder Right;Left    Right Shoulder Flexion 153 Degrees    Right Shoulder ABduction 160 Degrees    Right Shoulder Internal Rotation 68 Degrees    Right Shoulder External Rotation 83 Degrees    Left Shoulder Flexion 145 Degrees    Left Shoulder ABduction 150 Degrees    Left Shoulder Internal Rotation 47 Degrees    Left Shoulder External Rotation 78 Degrees             LYMPHEDEMA/ONCOLOGY QUESTIONNAIRE - 01/20/20 0001      Surgeries   Lumpectomy Date 04/08/19    Sentinel Lymph Node Biopsy Date 04/08/19    Number Lymph Nodes Removed 4      Treatment   Active Chemotherapy Treatment No    Past Chemotherapy Treatment Yes    Active Radiation Treatment No    Past Radiation Treatment Yes    Body Site L breast and axilla    Current Hormone Treatment Yes    Drug Name Tamoxifen       What other symptoms do you have   Are you Having Heaviness or Tightness No    Are you having Pain No    Are you having pitting edema No    Is it Hard or Difficult finding clothes that fit No    Do you have infections No    Is there Decreased scar mobility No      Lymphedema Assessments   Lymphedema Assessments Upper extremities      Right Upper Extremity Lymphedema   15 cm Proximal to Olecranon Process 34.4 cm    Olecranon Process 27 cm    10 cm Proximal to Ulnar Styloid Process 24.9 cm    Just Proximal to Ulnar Styloid Process 17.2 cm    Across Hand at [employer] 19.8 cm    At [place] of 2nd Digit 6.4 cm      Left Upper Extremity  Lymphedema   15 cm Proximal to Olecranon Process 33.2 cm    Olecranon Process 26.5 cm    10 cm Proximal to Ulnar Styloid Process 23.6 cm  Just Proximal to Ulnar Styloid Process 17.1 cm    Across Hand at [employer] 19.8 cm    At [place] of 2nd Digit 6.3 cm                   Objective measurements completed on examination: See above findings.       [facility] Adult PT Treatment/Exercise - 01/20/20 0001      Exercises   Exercises Shoulder      Shoulder Exercises: Seated   External Rotation AROM;Both;5 reps    External Rotation Limitations seated with elbows at side and then in abduction with hands behind head. VC for squeezing scapula for improvement movement    Flexion AAROM;5 reps    Flexion Limitations Seated VC for end range stretch and using the RUE to help stretch the LUE      Shoulder Exercises: Standing   ABduction AAROM;Left;5 reps    ABduction Limitations on wall VC for end range stretch and demonstration to avoid trunk rotation into flexion. VC to avoid painful stretch                   PT Education - 01/20/20 0916    Education Details Pt was educated on the anatomy and physiology of the lymphatic system. Discussed lymphatic risk reduction precautions including possibly getting a sleeve if she travels by plane a lot or atleast monitoring for edema. She was educated on steps to take if she does notice swelling in the LUE and was provided with a script for a sleeve if needed. Pt was educated on cording and how this can effect mobility of the LUE. Discussed performing abduction wall stretch in the shower after tissue has warmed up to help break up cording noted in the L axilla.    Person(s) Educated Patient    Methods Explanation;Demonstration;Verbal cues;Handout    Comprehension Verbalized understanding;Returned demonstration            PT Short Term Goals - 01/20/20 6767      PT SHORT TERM GOAL #1   Title Pt will be independent with HEP to  demonstrate autonomy of care.    Baseline pt was provided with post op exercises.    Time 1    Period Weeks    Status New    Target Date 01/27/20             PT Long Term Goals - 01/20/20 0923      PT LONG TERM GOAL #1   Title Pt will improve L shoulder ROM to 153 degrees flexion, 160 degrees abduction and 60 degrees IR within 2 weeks in order to improve functional ROM to return to pre operative ROM.    Baseline L shoulder flexion: 145 abduction: 150 internal rotation: 47    Time 2    Period Weeks    Status New    Target Date 02/17/20      PT LONG TERM GOAL #2   Title Pt will have appropriate fitting compression sleeve to wear as needed in order to decrease risk for lymphedema.    Baseline pt currently does not have a compression sleeve.    Time 2    Period Weeks    Status New    Target Date 02/17/20      PT LONG TERM GOAL #3   Title Pt will demonstrate decreased cording in the L axilla in order to demonstrate decreased tissue restrictions of the LUE.    Baseline pt  demonstrate multiple small cords in her L axilla to distal to her L antecubital fossa    Time 2    Period Weeks    Status New    Target Date 02/17/20                  Plan - 01/20/20 0919    Clinical Impression Statement Pt presents to physical therapy with reports of decreased ROM in her L shoulder compared to her R that was confirmed by goniometric measurements. Cording was noted in her L axilla to just below the medial antecubital fossa. Pt was unaware of lymphedema risk and ways to reduce risk. She was educated on risk reduction precautions and discussed how to manage early stage of lymphedema at home. Pt will benefit from skilled physical therapy services in order to address the above limitations.    Personal Factors and Comorbidities Comorbidity 1    Comorbidities L lumpectomy with radiation/chemotherapy and SLNB with 4 lymph nodes removed    Stability/Clinical Decision Making Stable/Uncomplicated     Clinical Decision Making Low    Rehab Potential Excellent    PT Frequency 2x / week    PT Duration 2 weeks    PT Treatment/Interventions Therapeutic activities;Therapeutic exercise;Neuromuscular re-education;Manual techniques    PT Next Visit Plan myofascial release, STM for the L shoulder, assess post op exercises, progress HEP, assess need to continue    PT Home Exercise Plan post-op exercises    Recommended Other Services compression sleeve, gauntlet    Consulted and Agree with Plan of Care Patient           Patient will benefit from skilled therapeutic intervention in order to improve the following deficits and impairments:  Decreased knowledge of precautions, Decreased range of motion  Visit Diagnosis: Malignant neoplasm of upper-outer quadrant of left breast in female, estrogen receptor positive (HCC)  Stiffness of left shoulder, not elsewhere classified     Problem List Patient Active Problem List   Diagnosis Date Noted  . Neuropathy due to chemotherapeutic drug ([place]) 03/16/2019  . Port-A-Cath in place 01/11/2019  . Chemotherapy induced neutropenia ([place]) 12/22/2018  . Malignant neoplasm of upper-outer quadrant of left breast in female, estrogen receptor positive ([place]) 09/28/2018  . Acute calculous cholecystitis 02/22/2015  . Knee pain 05/04/2012    Ander Purpura, PT 01/20/2020, 9:26 AM  [place], [place], [address] Phone: [phone]   Fax:  [phone]  Name: Natasha Huffman MRN: [id number] Date of Birth: 1961-06-21

## 2020-01-24 ENCOUNTER — Other Ambulatory Visit: Payer: Self-pay | Admitting: Oncology

## 2020-01-25 ENCOUNTER — Ambulatory Visit: Payer: 59

## 2020-01-25 ENCOUNTER — Other Ambulatory Visit: Payer: Self-pay

## 2020-01-25 DIAGNOSIS — M25612 Stiffness of left shoulder, not elsewhere classified: Secondary | ICD-10-CM

## 2020-01-25 DIAGNOSIS — Z170 Estrogen receptor positive status [ER+]: Secondary | ICD-10-CM | POA: Diagnosis not present

## 2020-01-25 DIAGNOSIS — R293 Abnormal posture: Secondary | ICD-10-CM

## 2020-01-25 DIAGNOSIS — C50412 Malignant neoplasm of upper-outer quadrant of left female breast: Secondary | ICD-10-CM

## 2020-01-25 NOTE — Patient Instructions (Signed)

## 2020-01-25 NOTE — Therapy (Signed)
[place], [place], [address] Phone: [phone]   Fax:  [phone]  Physical Therapy Treatment  Patient Details  Name: Natasha Huffman MRN: [id number] Date of Birth: 10-17-1960 Referring Provider (PT): Lurline Del MD   Encounter Date: 01/25/2020   PT End of Session - 01/25/20 1006    Visit Number 2    Number of Visits 5    Date for PT Re-Evaluation 02/17/20    PT Start Time 0911    PT Stop Time 1005    PT Time Calculation (min) 54 min    Activity Tolerance Patient tolerated treatment well    Behavior During Therapy [facility] for tasks assessed/performed           Past Medical History:  Diagnosis Date  . Breast cancer ([place]) 2020   Left Breast Cancer  . 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 Gallstones   . GERD (gastroesophageal reflux disease)    resolved   . Peripheral neuropathy due to chemotherapy ([place])   . Personal history of chemotherapy 2020   Left Breast Cancer  . Personal history of radiation therapy 2020   Left Breast Cancer    Past Surgical History:  Procedure Laterality Date  . BREAST EXCISIONAL BIOPSY Left 04/08/2019  . BREAST LUMPECTOMY Left 04/08/2019  . BREAST LUMPECTOMY WITH RADIOACTIVE SEED AND SENTINEL LYMPH NODE BIOPSY Left 04/08/2019   Procedure: LEFT BREAST RADIOACTIVE SEED LUMPECTOMY X2 AND LEFT SENTINEL LYMPH NODE MAPPING;  Surgeon: [name], MD;  Location: [place];  Service: General;  Laterality: Left;  . [place]; 1995  . CHOLECYSTECTOMY N/A 02/22/2015   Procedure: LAPAROSCOPIC CHOLECYSTECTOMY WITH INTRAOPERATIVE CHOLANGIOGRAM;  Surgeon: [name], MD;  Location: [place];  Service: General;  Laterality: N/A;  . LAPAROSCOPIC CHOLECYSTECTOMY  02/22/2015  . PORTACATH PLACEMENT Right 10/08/2018   Procedure: INSERTION PORT-A-CATH WITH ULTRASOUND;  Surgeon: [name], MD;  Location: [place];   Service: General;  Laterality: Right;  . TUBAL LIGATION  1995    There were no vitals filed for this visit.   Subjective Assessment - 01/25/20 0912    Subjective I'm doing the HEP stretches a couple times a day and I can tell my ROM is improving. I just still feel a little tight at the end of my motion.    Pertinent History invasive ductal carcinoma, grade III. Prognostic indicators significant for: estrogen receptor, 100% positive and progesterone receptor, 2% positive, both with strong staining intensity. Proliferation marker Ki67 at 40%. HER2 positive (3+)  04/08/2019 L lumpectomy with 4 lymph node removal all were negative. She has currently finished radiation and chemotherapy    Patient Stated Goals I want to get my range back in my L arm.    Currently in Pain? No/denies              [facility] PT Assessment - 01/25/20 0001      AROM   Left Shoulder Flexion 151 Degrees    Left Shoulder ABduction 171 Degrees    Left Shoulder Internal Rotation 65 Degrees                         [facility] Adult PT Treatment/Exercise - 01/25/20 0001      Self-Care   Self-Care Other Self-Care Comments    Other Self-Care Comments  Pt was instructed in lymphedema risk reduction and infection prevention while  answering pts questions throughout.       Shoulder Exercises: Supine   Horizontal ABduction Strengthening;Both;10 reps;Theraband    Theraband Level (Shoulder Horizontal ABduction) Level 2 (Red)    Horizontal ABduction Limitations Pt returned therapist demo for all scapular series    External Rotation Strengthening;Both;10 reps;Theraband    Theraband Level (Shoulder External Rotation) Level 2 (Red)    External Rotation Limitations Tactile cues to keep elbows adducted     Flexion Strengthening;Both;5 reps;Theraband   Narrow and Wide Grip, 5 times each   Theraband Level (Shoulder Flexion) Level 2 (Red)    Diagonals Strengthening;Right;Left;5 reps;Theraband    Theraband Level (Shoulder  Diagonals) Level 2 (Red)      Manual Therapy   Manual Therapy Myofascial release;Passive ROM    Myofascial Release To Lt axilla and some at antecubital fossa where cording noticeable, very minimal though at antecubital fossa, 3 cords noted at axilla, but these were less so by end of session    Passive ROM To Lt shoulder into flexion, abduction and D2 to pts available end ROM                  PT Education - 01/25/20 0941    Education Details Supine scapular series with red theraband, issued green for later home use    Person(s) Educated Patient    Methods Explanation;Demonstration;Handout    Comprehension Verbalized understanding;Returned demonstration            PT Short Term Goals - 01/20/20 6644      PT SHORT TERM GOAL #1   Title Pt will be independent with HEP to demonstrate autonomy of care.    Baseline pt was provided with post op exercises.    Time 1    Period Weeks    Status New    Target Date 01/27/20             PT Long Term Goals - 01/25/20 0929      PT LONG TERM GOAL #1   Title Pt will improve L shoulder ROM to 153 degrees flexion, 160 degrees abduction and 60 degrees IR within 2 weeks in order to improve functional ROM to return to pre operative ROM.    Baseline L shoulder flexion: 145 abduction: 150 internal rotation: 47; flexion 151, abd 171 and IR 65    Status Partially Met      PT LONG TERM GOAL #2   Title Pt will have appropriate fitting compression sleeve to wear as needed in order to decrease risk for lymphedema.    Baseline pt currently does not have a compression sleeve; issued script for compression sleeve for pt to go get measured when needed - 01/25/20    Status Partially Met      PT LONG TERM GOAL #3   Title Pt will demonstrate decreased cording in the L axilla in order to demonstrate decreased tissue restrictions of the LUE.    Baseline pt demonstrate multiple small cords in her L axilla to distal to her L antecubital fossa                  Plan - 01/25/20 1007    Clinical Impression Statement Pts A/ROM has improved some since last week but cording still present at axilla, very minimal to none today at antecubital fossa.  Progressed HEP to include supine scapular series which she tolerated very well. Pt will benefit from continued therapy at this time to further decrease cording and improve ROM.  Personal Factors and Comorbidities Comorbidity 1    Comorbidities L lumpectomy with radiation/chemotherapy and SLNB with 4 lymph nodes removed    Stability/Clinical Decision Making Stable/Uncomplicated    Rehab Potential Excellent    PT Frequency 2x / week    PT Duration 2 weeks    PT Treatment/Interventions Therapeutic activities;Therapeutic exercise;Neuromuscular re-education;Manual techniques    PT Next Visit Plan myofascial release, STM for the L shoulder, assess post op exercises, progress HEP and review new HEP, assess need to continue    PT Home Exercise Plan post-op exercises; supine scapular series    Consulted and Agree with Plan of Care Patient           Patient will benefit from skilled therapeutic intervention in order to improve the following deficits and impairments:  Decreased knowledge of precautions, Decreased range of motion  Visit Diagnosis: Malignant neoplasm of upper-outer quadrant of left breast in female, estrogen receptor positive (HCC)  Stiffness of left shoulder, not elsewhere classified  Abnormal posture     Problem List Patient Active Problem List   Diagnosis Date Noted  . Neuropathy due to chemotherapeutic drug ([place]) 03/16/2019  . Port-A-Cath in place 01/11/2019  . Chemotherapy induced neutropenia ([place]) 12/22/2018  . Malignant neoplasm of upper-outer quadrant of left breast in female, estrogen receptor positive ([place]) 09/28/2018  . Acute calculous cholecystitis 02/22/2015  . Knee pain 05/04/2012    Otelia Limes, PTA 01/25/2020, 12:49 PM  [place], [place], [address] Phone: [phone]   Fax:  [phone]  Name: Natasha Huffman MRN: [id number] Date of Birth: 05/11/1961

## 2020-01-26 ENCOUNTER — Ambulatory Visit: Payer: 59

## 2020-01-26 DIAGNOSIS — R293 Abnormal posture: Secondary | ICD-10-CM | POA: Diagnosis not present

## 2020-01-26 DIAGNOSIS — C50412 Malignant neoplasm of upper-outer quadrant of left female breast: Secondary | ICD-10-CM | POA: Diagnosis not present

## 2020-01-26 DIAGNOSIS — M25612 Stiffness of left shoulder, not elsewhere classified: Secondary | ICD-10-CM

## 2020-01-26 DIAGNOSIS — Z170 Estrogen receptor positive status [ER+]: Secondary | ICD-10-CM | POA: Diagnosis not present

## 2020-01-26 NOTE — Therapy (Signed)
[place], [place], [address] Phone: [phone]   Fax:  [phone]  Physical Therapy Treatment  Patient Details  Name: Natasha Huffman MRN: [id number] Date of Birth: 1960/08/18 Referring Provider (PT): Lurline Del MD   Encounter Date: 01/26/2020   PT End of Session - 01/26/20 0857    Visit Number 3    Number of Visits 5    Date for PT Re-Evaluation 02/17/20    PT Start Time 0905    PT Stop Time 0959    PT Time Calculation (min) 54 min    Activity Tolerance Patient tolerated treatment well    Behavior During Therapy [facility] for tasks assessed/performed           Past Medical History:  Diagnosis Date  . Breast cancer ([place]) 2020   Left Breast Cancer  . Cancer (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 Gallstones   . GERD (gastroesophageal reflux disease)    resolved   . Peripheral neuropathy due to chemotherapy ([place])   . Personal history of chemotherapy 2020   Left Breast Cancer  . Personal history of radiation therapy 2020   Left Breast Cancer    Past Surgical History:  Procedure Laterality Date  . BREAST EXCISIONAL BIOPSY Left 04/08/2019  . BREAST LUMPECTOMY Left 04/08/2019  . BREAST LUMPECTOMY WITH RADIOACTIVE SEED AND SENTINEL LYMPH NODE BIOPSY Left 04/08/2019   Procedure: LEFT BREAST RADIOACTIVE SEED LUMPECTOMY X2 AND LEFT SENTINEL LYMPH NODE MAPPING;  Surgeon: [name], MD;  Location: [place];  Service: General;  Laterality: Left;  . [place]; 1995  . CHOLECYSTECTOMY N/A 02/22/2015   Procedure: LAPAROSCOPIC CHOLECYSTECTOMY WITH INTRAOPERATIVE CHOLANGIOGRAM;  Surgeon: [name], MD;  Location: [place];  Service: General;  Laterality: N/A;  . LAPAROSCOPIC CHOLECYSTECTOMY  02/22/2015  . PORTACATH PLACEMENT Right 10/08/2018   Procedure: INSERTION PORT-A-CATH WITH ULTRASOUND;  Surgeon: [name], MD;  Location: [place];   Service: General;  Laterality: Right;  . TUBAL LIGATION  1995    There were no vitals filed for this visit.   Subjective Assessment - 01/26/20 0858    Subjective Pt states that she is feeling pretty good. She has no pain and has not noticed any cording in her axilla.    Pertinent History invasive ductal carcinoma, grade III. Prognostic indicators significant for: estrogen receptor, 100% positive and progesterone receptor, 2% positive, both with strong staining intensity. Proliferation marker Ki67 at 40%. HER2 positive (3+)  04/08/2019 L lumpectomy with 4 lymph node removal all were negative. She has currently finished radiation and chemotherapy    Patient Stated Goals I want to get my range back in my L arm.    Currently in Pain? No/denies                             [facility] Adult PT Treatment/Exercise - 01/26/20 0001      Shoulder Exercises: Seated   External Rotation Strengthening;Both;20 reps    Theraband Level (Shoulder External Rotation) Level 2 (Red)    External Rotation Limitations demonstration with band hooked under legs, elbows at 90 degrees, VC for tight core to prevent lumbar arching and shoulders down/back.     Other Seated Exercises isometric external rotation with demonstration for correct technique and position including sitting up right 10x 10 seconds for the LUE      Shoulder  Exercises: Standing   Extension Strengthening;Both;20 reps    Theraband Level (Shoulder Extension) Level 2 (Red)    Extension Limitations demonstration with VC for tight core, 1x VC for keeping elbows straight     Row Strengthening;Both;20 reps    Theraband Level (Shoulder Row) Level 2 (Red)    Row Limitations demonstration with intermittent VC to prevent shoulder hiking on the L.     Other Standing Exercises Wall slide into flexion abduction to end range with VC to avoid pain/symptoms and to lean in for easy stretch at end range.       Manual Therapy   Manual Therapy Myofascial  release;Passive ROM;Soft tissue mobilization    Soft tissue mobilization Palpable tightness/tenderness noted at the L deltoid, upper trapezius, levator scapula, biceps, pectoralis major, posterior deltoid; decreased moderately following STM with petrissage over posterior deltoid into abduction     Myofascial Release to L axilla and down L lateral trunk wall with cross hand and longitudinal technique. Pt has cording noted in the L axilla still none noted at antecubital fossa but pt shoulder impingment symptoms seem to be hindering her mobilty more than cording.     Passive ROM P/ROM into abduction and flexion pt reports pinching/pain at the posterior shoulder, following STM pt reports no pain/pinching at the posterior shoulder with abduction                  PT Education - 01/26/20 0954    Education Details Pt will continue with her current exercises at home for now.    Person(s) Educated Patient    Methods Explanation    Comprehension Verbalized understanding            PT Short Term Goals - 01/20/20 4656      PT SHORT TERM GOAL #1   Title Pt will be independent with HEP to demonstrate autonomy of care.    Baseline pt was provided with post op exercises.    Time 1    Period Weeks    Status New    Target Date 01/27/20             PT Long Term Goals - 01/25/20 0929      PT LONG TERM GOAL #1   Title Pt will improve L shoulder ROM to 153 degrees flexion, 160 degrees abduction and 60 degrees IR within 2 weeks in order to improve functional ROM to return to pre operative ROM.    Baseline L shoulder flexion: 145 abduction: 150 internal rotation: 47; flexion 151, abd 171 and IR 65    Status Partially Met      PT LONG TERM GOAL #2   Title Pt will have appropriate fitting compression sleeve to wear as needed in order to decrease risk for lymphedema.    Baseline pt currently does not have a compression sleeve; issued script for compression sleeve for pt to go get measured when  needed - 01/25/20    Status Partially Met      PT LONG TERM GOAL #3   Title Pt will demonstrate decreased cording in the L axilla in order to demonstrate decreased tissue restrictions of the LUE.    Baseline pt demonstrate multiple small cords in her L axilla to distal to her L antecubital fossa                 Plan - 01/26/20 0857    Clinical Impression Statement Palpable tightness/tenderness noted during P/ROM into abduction with pt reporting impingment  symptoms; worse with abduction. STM to the L deltoid, upper trapezius, biceps and levator scapulae with significant improvement in tightness/tenderness. Petrissage with P/ROM into abduction to the posterior deltoid significantly imporved pain-free abduction. Pt was able to perform wall sides to end range with light stretching w/o an increase in pain from baseline. No cording noted in the antecubital fossa this session she continues with cording in the L axilla but it seems to not be effecting ROM as much as impingment symptoms. Pt will benefit from continued POC at this time.    Personal Factors and Comorbidities Comorbidity 1    Comorbidities L lumpectomy with radiation/chemotherapy and SLNB with 4 lymph nodes removed    Rehab Potential Excellent    PT Frequency 2x / week    PT Duration 2 weeks    PT Treatment/Interventions Therapeutic activities;Therapeutic exercise;Neuromuscular re-education;Manual techniques    PT Next Visit Plan Possible discharge, assess goals, update HEP, myofascial release, STM for the L shoulder, assess post op exercises, progress HEP and review new HEP, assess need to continue    PT Home Exercise Plan post-op exercises; supine scapular series    Consulted and Agree with Plan of Care Patient           Patient will benefit from skilled therapeutic intervention in order to improve the following deficits and impairments:  Decreased knowledge of precautions, Decreased range of motion  Visit  Diagnosis: Malignant neoplasm of upper-outer quadrant of left breast in female, estrogen receptor positive (HCC)  Stiffness of left shoulder, not elsewhere classified  Abnormal posture     Problem List Patient Active Problem List   Diagnosis Date Noted  . Neuropathy due to chemotherapeutic drug ([place]) 03/16/2019  . Port-A-Cath in place 01/11/2019  . Chemotherapy induced neutropenia ([place]) 12/22/2018  . Malignant neoplasm of upper-outer quadrant of left breast in female, estrogen receptor positive ([place]) 09/28/2018  . Acute calculous cholecystitis 02/22/2015  . Knee pain 05/04/2012    Ander Purpura, PT 01/26/2020, 10:01 AM  [place], [place], [address] Phone: [phone]   Fax:  [phone]  Name: Natasha Huffman MRN: [id number] Date of Birth: Aug 20, 1960

## 2020-01-27 ENCOUNTER — Other Ambulatory Visit: Payer: Self-pay

## 2020-01-27 ENCOUNTER — Encounter (HOSPITAL_BASED_OUTPATIENT_CLINIC_OR_DEPARTMENT_OTHER): Payer: Self-pay | Admitting: Surgery

## 2020-01-31 ENCOUNTER — Other Ambulatory Visit: Payer: Self-pay

## 2020-01-31 ENCOUNTER — Other Ambulatory Visit (HOSPITAL_COMMUNITY): Payer: 59

## 2020-01-31 ENCOUNTER — Other Ambulatory Visit (HOSPITAL_COMMUNITY)
Admission: RE | Admit: 2020-01-31 | Discharge: 2020-01-31 | Disposition: A | Discharge status: Home / Self Care | Payer: 59 | Source: Ambulatory Visit | Attending: Surgery | Admitting: Surgery

## 2020-01-31 ENCOUNTER — Ambulatory Visit: Payer: 59

## 2020-01-31 DIAGNOSIS — M25612 Stiffness of left shoulder, not elsewhere classified: Secondary | ICD-10-CM | POA: Diagnosis not present

## 2020-01-31 DIAGNOSIS — R293 Abnormal posture: Secondary | ICD-10-CM | POA: Diagnosis not present

## 2020-01-31 DIAGNOSIS — C50412 Malignant neoplasm of upper-outer quadrant of left female breast: Secondary | ICD-10-CM | POA: Diagnosis not present

## 2020-01-31 DIAGNOSIS — Z170 Estrogen receptor positive status [ER+]: Secondary | ICD-10-CM | POA: Diagnosis not present

## 2020-01-31 DIAGNOSIS — Z20822 Contact with and (suspected) exposure to covid-19: Secondary | ICD-10-CM | POA: Diagnosis not present

## 2020-01-31 DIAGNOSIS — Z01812 Encounter for preprocedural laboratory examination: Secondary | ICD-10-CM | POA: Diagnosis not present

## 2020-01-31 LAB — SARS CORONAVIRUS 2 (TAT 6-24 HRS): SARS Coronavirus 2: NEGATIVE

## 2020-01-31 NOTE — Therapy (Addendum)
[place], [place], [address] Phone: [phone]   Fax:  [phone]  Physical Therapy Discharge Note  Patient Details  Name: Natasha Huffman MRN: [id number] Date of Birth: [birth year] Referring Provider (PT): Lurline Del MD   Encounter Date: 01/31/2020   PT End of Session - 01/31/20 0942    Visit Number 4    Number of Visits 5    Date for PT Re-Evaluation 02/17/20    PT Start Time 0910   pt got appt time switched so arrived a few mins late   PT Stop Time 0935   pt was ready to D/C today and did not want to stay for full session   PT Time Calculation (min) 25 min    Activity Tolerance Patient tolerated treatment well    Behavior During Therapy [facility] for tasks assessed/performed           Past Medical History:  Diagnosis Date  . Breast cancer ([place]) 2020   Left Breast Cancer  . 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 Gallstones   . GERD (gastroesophageal reflux disease)    resolved   . Peripheral neuropathy due to chemotherapy ([place])   . Personal history of chemotherapy 2020   Left Breast Cancer  . Personal history of radiation therapy 2020   Left Breast Cancer    Past Surgical History:  Procedure Laterality Date  . BREAST EXCISIONAL BIOPSY Left 04/08/2019  . BREAST LUMPECTOMY Left 04/08/2019  . BREAST LUMPECTOMY WITH RADIOACTIVE SEED AND SENTINEL LYMPH NODE BIOPSY Left 04/08/2019   Procedure: LEFT BREAST RADIOACTIVE SEED LUMPECTOMY X2 AND LEFT SENTINEL LYMPH NODE MAPPING;  Surgeon: [name], MD;  Location: [place];  Service: General;  Laterality: Left;  . [place]; 1995  . CHOLECYSTECTOMY N/A 02/22/2015   Procedure: LAPAROSCOPIC CHOLECYSTECTOMY WITH INTRAOPERATIVE CHOLANGIOGRAM;  Surgeon: [name], MD;  Location: [place];  Service: General;  Laterality: N/A;  . LAPAROSCOPIC CHOLECYSTECTOMY  02/22/2015  . PORTACATH  PLACEMENT Right 10/08/2018   Procedure: INSERTION PORT-A-CATH WITH ULTRASOUND;  Surgeon: [name], MD;  Location: [place];  Service: General;  Laterality: Right;  . TUBAL LIGATION  1995    There were no vitals filed for this visit.   Subjective Assessment - 01/31/20 0921    Subjective I'm doing well and ready to make today my last visit. I get my port out this week as well so I'm doing good.    Pertinent History invasive ductal carcinoma, grade III. Prognostic indicators significant for: estrogen receptor, 100% positive and progesterone receptor, 2% positive, both with strong staining intensity. Proliferation marker Ki67 at 40%. HER2 positive (3+)  04/08/2019 L lumpectomy with 4 lymph node removal all were negative. She has currently finished radiation and chemotherapy    Patient Stated Goals I want to get my range back in my L arm.    Currently in Pain? No/denies                             [facility] Adult PT Treatment/Exercise - 01/31/20 0001      Self-Care   Other Self-Care Comments  Spent time reviewing goals and assessing pts current functional status. Also reviewed "low and slow" protocol progression with exercises to keep risk of lymphedema low, answering her questions throughout. Determined pt ready for D/C at this time.  PT Education - 01/31/20 0939    Education Details Bil UE 3 way raises and reviewed safe progression with exercises with keeping risk of lymphedema low using "low and slow" protocol progression    Person(s) Educated Patient    Methods Explanation;Demonstration;Handout    Comprehension Verbalized understanding            PT Short Term Goals - 01/20/20 6812      PT SHORT TERM GOAL #1   Title Pt will be independent with HEP to demonstrate autonomy of care.    Baseline pt was provided with post op exercises.    Time 1    Period Weeks    Status New    Target Date 01/27/20             PT Long Term Goals -  01/31/20 0913      PT LONG TERM GOAL #1   Title Pt will improve L shoulder ROM to 153 degrees flexion, 160 degrees abduction and 60 degrees IR within 2 weeks in order to improve functional ROM to return to pre operative ROM.    Baseline L shoulder flexion: 145 abduction: 150 internal rotation: 47; flexion 151, abd 171 and IR 65; Lt shoulder flexion, 160, abduction 171, internal rotation 69 degrees - 01/31/20    Status Achieved      PT LONG TERM GOAL #2   Title Pt will have appropriate fitting compression sleeve to wear as needed in order to decrease risk for lymphedema.    Baseline pt currently does not have a compression sleeve; issued script for compression sleeve for pt to go get measured when needed - 01/25/20; pt has script and info of where to get measured and will be measured prn - 01/31/20    Status Achieved      PT LONG TERM GOAL #3   Title Pt will demonstrate decreased cording in the L axilla in order to demonstrate decreased tissue restrictions of the LUE.    Baseline pt demonstrate multiple small cords in her L axilla to distal to her L antecubital fossa; pt reports no cording limiting her ROM now, just feels tightness at end ROM - 01/31/20    Status Achieved                 Plan - 01/31/20 0944    Clinical Impression Statement Pt has met all goals and reports good compliance with HEP at this time. Also progressed this today with including instruction for "low and slow" progression for future exercising at home. She was able to verbalize good understanding and reports ready for D/C at this time.    Personal Factors and Comorbidities Comorbidity 1    Comorbidities L lumpectomy with radiation/chemotherapy and SLNB with 4 lymph nodes removed    Stability/Clinical Decision Making Stable/Uncomplicated    Rehab Potential Excellent    PT Frequency 2x / week    PT Duration 2 weeks    PT Treatment/Interventions Therapeutic activities;Therapeutic exercise;Neuromuscular  re-education;Manual techniques    PT Next Visit Plan D/C this visit.    PT Home Exercise Plan post-op exercises; supine scapular series, bil UE 3 way raises, work on daily walking routine    Consulted and Agree with Plan of Care Patient           Patient will benefit from skilled therapeutic intervention in order to improve the following deficits and impairments:  Decreased knowledge of precautions, Decreased range of motion  Visit Diagnosis: Malignant neoplasm of upper-outer quadrant  of left breast in female, estrogen receptor positive ([place])  Stiffness of left shoulder, not elsewhere classified  Abnormal posture     Problem List Patient Active Problem List   Diagnosis Date Noted  . Neuropathy due to chemotherapeutic drug ([place]) 03/16/2019  . Port-A-Cath in place 01/11/2019  . Chemotherapy induced neutropenia ([place]) 12/22/2018  . Malignant neoplasm of upper-outer quadrant of left breast in female, estrogen receptor positive ([place]) 09/28/2018  . Acute calculous cholecystitis 02/22/2015  . Knee pain 05/04/2012   PHYSICAL THERAPY DISCHARGE SUMMARY  Plan: Patient agrees to discharge.  Patient goals were met. Patient is being discharged due to meeting the stated rehab goals.  ?????       Otelia Limes, PTA 01/31/2020, 9:52 AM  Tomma Rakers, PT 01/31/20 1:10 PM   [place], [place], [address] Phone: [phone]   Fax:  [phone]  Name: Natasha Huffman MRN: [id number] Date of Birth: 1960/08/13

## 2020-01-31 NOTE — Patient Instructions (Signed)

## 2020-01-31 NOTE — Progress Notes (Signed)
Surgical soap given with instructions, pt verbalized understanding.  

## 2020-02-02 ENCOUNTER — Encounter: Payer: 59 | Admitting: Physical Therapy

## 2020-02-02 MED FILL — TAMOXIFEN 20 MG TABLET: 20 | 90 days supply | Qty: 90 | Fill #2

## 2020-02-03 ENCOUNTER — Other Ambulatory Visit: Payer: Self-pay

## 2020-02-03 ENCOUNTER — Ambulatory Visit (HOSPITAL_BASED_OUTPATIENT_CLINIC_OR_DEPARTMENT_OTHER): Payer: 59 | Admitting: Certified Registered"

## 2020-02-03 ENCOUNTER — Encounter (HOSPITAL_BASED_OUTPATIENT_CLINIC_OR_DEPARTMENT_OTHER): Payer: Self-pay | Admitting: Surgery

## 2020-02-03 ENCOUNTER — Ambulatory Visit (HOSPITAL_BASED_OUTPATIENT_CLINIC_OR_DEPARTMENT_OTHER)
Admission: RE | Admit: 2020-02-03 | Discharge: 2020-02-03 | Disposition: A | Discharge status: Home / Self Care | Payer: 59 | Attending: Surgery | Admitting: Surgery

## 2020-02-03 ENCOUNTER — Encounter (HOSPITAL_BASED_OUTPATIENT_CLINIC_OR_DEPARTMENT_OTHER)
Admission: RE | Disposition: A | Discharge status: Home / Self Care | Payer: Self-pay | Source: Home / Self Care | Attending: Surgery

## 2020-02-03 DIAGNOSIS — Z79810 Long term (current) use of selective estrogen receptor modulators (SERMs): Secondary | ICD-10-CM | POA: Diagnosis not present

## 2020-02-03 DIAGNOSIS — C50912 Malignant neoplasm of unspecified site of left female breast: Secondary | ICD-10-CM | POA: Insufficient documentation

## 2020-02-03 DIAGNOSIS — Z9221 Personal history of antineoplastic chemotherapy: Secondary | ICD-10-CM | POA: Diagnosis not present

## 2020-02-03 DIAGNOSIS — C50412 Malignant neoplasm of upper-outer quadrant of left female breast: Secondary | ICD-10-CM | POA: Diagnosis not present

## 2020-02-03 DIAGNOSIS — K219 Gastro-esophageal reflux disease without esophagitis: Secondary | ICD-10-CM | POA: Diagnosis not present

## 2020-02-03 DIAGNOSIS — Z452 Encounter for adjustment and management of vascular access device: Secondary | ICD-10-CM | POA: Insufficient documentation

## 2020-02-03 DIAGNOSIS — Z923 Personal history of irradiation: Secondary | ICD-10-CM | POA: Insufficient documentation

## 2020-02-03 DIAGNOSIS — Z170 Estrogen receptor positive status [ER+]: Secondary | ICD-10-CM | POA: Diagnosis not present

## 2020-02-03 HISTORY — PX: PORT-A-CATH REMOVAL: SHX5289

## 2020-02-03 SURGERY — REMOVAL PORT-A-CATH
Anesthesia: Monitor Anesthesia Care | Site: Chest

## 2020-02-03 MED ORDER — FENTANYL CITRATE (PF) 100 MCG/2ML IJ SOLN
INTRAMUSCULAR | Status: AC
  Filled 2020-02-03: qty 2

## 2020-02-03 MED ORDER — ACETAMINOPHEN 500 MG PO TABS
1000.0000 mg | ORAL_TABLET | ORAL | Status: AC
  Administered 2020-02-03: 1000 mg via ORAL

## 2020-02-03 MED ORDER — CEFAZOLIN SODIUM-DEXTROSE 2-4 GM/100ML-% IV SOLN
INTRAVENOUS | Status: AC
  Filled 2020-02-03: qty 100

## 2020-02-03 MED ORDER — OXYCODONE HCL 5 MG/5ML PO SOLN: 5.0000 mg | Freq: Once | ORAL | Status: DC | PRN

## 2020-02-03 MED ORDER — ACETAMINOPHEN 500 MG PO TABS
ORAL_TABLET | ORAL | Status: AC
  Filled 2020-02-03: qty 2

## 2020-02-03 MED ORDER — HYDROMORPHONE HCL 1 MG/ML IJ SOLN: 0.2500 mg | INTRAMUSCULAR | Status: DC | PRN

## 2020-02-03 MED ORDER — OXYCODONE HCL 5 MG PO TABS: 5.0000 mg | ORAL_TABLET | Freq: Once | ORAL | Status: DC | PRN

## 2020-02-03 MED ORDER — LIDOCAINE 2% (20 MG/ML) 5 ML SYRINGE
INTRAMUSCULAR | Status: DC | PRN
  Administered 2020-02-03: 40 mg via INTRAVENOUS

## 2020-02-03 MED ORDER — PROMETHAZINE HCL 12.5 MG RE SUPP: 25.0000 mg | Freq: Once | RECTAL | Status: DC | PRN

## 2020-02-03 MED ORDER — LACTATED RINGERS IV SOLN: INTRAVENOUS | Status: DC

## 2020-02-03 MED ORDER — CHLORHEXIDINE GLUCONATE CLOTH 2 % EX PADS: 6.0000 | MEDICATED_PAD | Freq: Once | CUTANEOUS | Status: DC

## 2020-02-03 MED ORDER — ONDANSETRON HCL 4 MG/2ML IJ SOLN
INTRAMUSCULAR | Status: DC | PRN
  Administered 2020-02-03: 4 mg via INTRAVENOUS

## 2020-02-03 MED ORDER — EPHEDRINE 5 MG/ML INJ
INTRAVENOUS | Status: AC
  Filled 2020-02-03: qty 50

## 2020-02-03 MED ORDER — MIDAZOLAM HCL 5 MG/5ML IJ SOLN
INTRAMUSCULAR | Status: DC | PRN
  Administered 2020-02-03: 2 mg via INTRAVENOUS

## 2020-02-03 MED ORDER — IBUPROFEN 800 MG PO TABS
800.0000 mg | ORAL_TABLET | Freq: Three times a day (TID) | ORAL | 0 refills | Status: DC | PRN
Start: 2020-02-03 — End: 2021-02-05

## 2020-02-03 MED ORDER — MIDAZOLAM HCL 2 MG/2ML IJ SOLN
INTRAMUSCULAR | Status: AC
  Filled 2020-02-03: qty 2

## 2020-02-03 MED ORDER — FENTANYL CITRATE (PF) 100 MCG/2ML IJ SOLN
INTRAMUSCULAR | Status: DC | PRN
  Administered 2020-02-03: 50 ug via INTRAVENOUS
  Administered 2020-02-03: 25 ug via INTRAVENOUS

## 2020-02-03 MED ORDER — PROPOFOL 500 MG/50ML IV EMUL
INTRAVENOUS | Status: DC | PRN
  Administered 2020-02-03: 100 ug/kg/min via INTRAVENOUS

## 2020-02-03 MED ORDER — CEFAZOLIN SODIUM-DEXTROSE 2-4 GM/100ML-% IV SOLN
2.0000 g | INTRAVENOUS | Status: AC
  Administered 2020-02-03: 2 g via INTRAVENOUS

## 2020-02-03 MED ORDER — BUPIVACAINE HCL (PF) 0.5 % IJ SOLN
INTRAMUSCULAR | Status: DC | PRN
  Administered 2020-02-03: 10 mL

## 2020-02-03 MED FILL — IBUPROFEN 800 MG TABS: 800 | 10 days supply | Qty: 30 | Fill #0

## 2020-02-03 SURGICAL SUPPLY — 28 items
BLADE SURG 15 STRL LF DISP TIS (BLADE) ×1 IMPLANT
BLADE SURG 15 STRL SS (BLADE) ×3
CHLORAPREP W/TINT 26 (MISCELLANEOUS) ×3 IMPLANT
COVER BACK TABLE 60X90IN (DRAPES) ×3 IMPLANT
COVER MAYO STAND STRL (DRAPES) ×3 IMPLANT
DERMABOND ADVANCED (GAUZE/BANDAGES/DRESSINGS) ×2
DERMABOND ADVANCED .7 DNX12 (GAUZE/BANDAGES/DRESSINGS) ×1 IMPLANT
DRAPE LAPAROTOMY 100X72 PEDS (DRAPES) ×3 IMPLANT
DRAPE UTILITY XL STRL (DRAPES) ×3 IMPLANT
ELECT REM PT RETURN 9FT ADLT (ELECTROSURGICAL) ×3
ELECTRODE REM PT RTRN 9FT ADLT (ELECTROSURGICAL) ×1 IMPLANT
GLOVE BIO SURGEON STRL SZ 6.5 (GLOVE) ×4 IMPLANT
GLOVE BIO SURGEONS STRL SZ 6.5 (GLOVE) ×2
GLOVE BIOGEL PI IND STRL 6.5 (GLOVE) ×3 IMPLANT
GLOVE BIOGEL PI IND STRL 8 (GLOVE) ×1 IMPLANT
GLOVE BIOGEL PI INDICATOR 6.5 (GLOVE) ×6
GLOVE BIOGEL PI INDICATOR 8 (GLOVE) ×2
GLOVE ECLIPSE 8.0 STRL XLNG CF (GLOVE) ×3 IMPLANT
GOWN STRL REUS W/ TWL LRG LVL3 (GOWN DISPOSABLE) ×3 IMPLANT
GOWN STRL REUS W/TWL LRG LVL3 (GOWN DISPOSABLE) ×9
NEEDLE HYPO 25X1 1.5 SAFETY (NEEDLE) ×3 IMPLANT
NS IRRIG 1000ML POUR BTL (IV SOLUTION) ×3 IMPLANT
SET BASIN DAY SURGERY F.S. (CUSTOM PROCEDURE TRAY) ×3 IMPLANT
SPONGE LAP 4X18 RFD (DISPOSABLE) ×3 IMPLANT
SUT MON AB 4-0 PC3 18 (SUTURE) ×3 IMPLANT
SUT VICRYL 3-0 CR8 SH (SUTURE) ×3 IMPLANT
SYR CONTROL 10ML LL (SYRINGE) ×3 IMPLANT
TOWEL GREEN STERILE FF (TOWEL DISPOSABLE) ×3 IMPLANT

## 2020-02-03 NOTE — Discharge Instructions (Signed)
GENERAL SURGERY: POST OP INSTRUCTIONS  ######################################################################  EAT Gradually transition to a high fiber diet with a fiber supplement over the next few weeks after discharge.  Start with a pureed / full liquid diet (see below)  WALK Walk an hour a day.  Control your pain to do that.    CONTROL PAIN Control pain so that you can walk, sleep, tolerate sneezing/coughing, go up/down stairs.  HAVE A BOWEL MOVEMENT DAILY Keep your bowels regular to avoid problems.  OK to try a laxative to override constipation.  OK to use an antidairrheal to slow down diarrhea.  Call if not better after 2 tries  CALL IF YOU HAVE PROBLEMS/CONCERNS Call if you are still struggling despite following these instructions. Call if you have concerns not answered by these instructions  ######################################################################    1. DIET: Follow a light bland diet & liquids the first 24 hours after arrival home, such as soup, liquids, starches, etc.  Be sure to drink plenty of fluids.  Quickly advance to a usual solid diet within a few days.  Avoid fast food or heavy meals as your are more likely to get nauseated or have irregular bowels.  A low-fat, high-fiber diet for the rest of your life is ideal.   2. Take your usually prescribed home medications unless otherwise directed. 3. PAIN CONTROL: a. Pain is best controlled by a usual combination of three different methods TOGETHER: i. Ice/Heat ii. Over the counter pain medication iii. Prescription pain medication b. Most patients will experience some swelling and bruising around the incisions.  Ice packs or heating pads (30-60 minutes up to 6 times a day) will help. Use ice for the first few days to help decrease swelling and bruising, then switch to heat to help relax tight/sore spots and speed recovery.  Some people prefer to use ice alone, heat alone, alternating between ice & heat.   Experiment to what works for you.  Swelling and bruising can take several weeks to resolve.   c. It is helpful to take an over-the-counter pain medication regularly for the first few weeks.  Choose one of the following that works best for you: i. Naproxen (Aleve, etc)  Two 220mg tabs twice a day ii. Ibuprofen (Advil, etc) Three 200mg tabs four times a day (every meal & bedtime) iii. Acetaminophen (Tylenol, etc) 500-650mg four times a day (every meal & bedtime) d. A  prescription for pain medication (such as oxycodone, hydrocodone, etc) should be given to you upon discharge.  Take your pain medication as prescribed.  i. If you are having problems/concerns with the prescription medicine (does not control pain, nausea, vomiting, rash, itching, etc), please call us [phone] to see if we need to switch you to a different pain medicine that will work better for you and/or control your side effect better. ii. If you need a refill on your pain medication, please contact your pharmacy.  They will contact our office to request authorization. Prescriptions will not be filled after 5 pm or on week-ends. 4. Avoid getting constipated.  Between the surgery and the pain medications, it is common to experience some constipation.  Increasing fluid intake and taking a fiber supplement (such as Metamucil, Citrucel, FiberCon, MiraLax, etc) 1-2 times a day regularly will usually help prevent this problem from occurring.  A mild laxative (prune juice, Milk of Magnesia, MiraLax, etc) should be taken according to package directions if there are no bowel movements after 48 hours.   5. Wash /   shower every day.  You may shower over the dressings as they are waterproof.  Continue to shower over incision(s) after the dressing is off. 6. Remove your waterproof bandages 5 days after surgery.  You may leave the incision open to air.  You may have skin tapes (Steri Strips) covering the incision(s).  Leave them on until one week, then  remove.  You may replace a dressing/Band-Aid to cover the incision for comfort if you wish.      7. ACTIVITIES as tolerated:   a. You may resume regular (light) daily activities beginning the next day--such as daily self-care, walking, climbing stairs--gradually increasing activities as tolerated.  If you can walk 30 minutes without difficulty, it is safe to try more intense activity such as jogging, treadmill, bicycling, low-impact aerobics, swimming, etc. b. Save the most intensive and strenuous activity for last such as sit-ups, heavy lifting, contact sports, etc  Refrain from any heavy lifting or straining until you are off narcotics for pain control.   c. DO NOT PUSH THROUGH PAIN.  Let pain be your guide: If it hurts to do something, don't do it.  Pain is your body warning you to avoid that activity for another week until the pain goes down. d. You may drive when you are no longer taking prescription pain medication, you can comfortably wear a seatbelt, and you can safely maneuver your car and apply brakes. e. Dennis Bast may have sexual intercourse when it is comfortable.  8. FOLLOW UP in our office a. Please call [facility] at [phone] to set up an appointment to see your surgeon in the office for a follow-up appointment approximately 2-3 weeks after your surgery. b. Make sure that you call for this appointment the day you arrive home to insure a convenient appointment time. 9. IF YOU HAVE DISABILITY OR FAMILY LEAVE FORMS, BRING THEM TO THE OFFICE FOR PROCESSING.  DO NOT GIVE THEM TO YOUR DOCTOR.   WHEN TO CALL us [phone]: 1. Poor pain control 2. Reactions / problems with new medications (rash/itching, nausea, etc)  3. Fever over 101.5 F (38.5 C) 4. Worsening swelling or bruising 5. Continued bleeding from incision. 6. Increased pain, redness, or drainage from the incision 7. Difficulty breathing / swallowing   The clinic staff is available to answer your questions during regular  business hours (8:30am-5pm).  Please don't hesitate to call and ask to speak to one of our nurses for clinical concerns.   If you have a medical emergency, go to the nearest emergency room or call 911.  A surgeon from [facility] is always on call at the [facility], [place], [place], [place], [place]  [address] ? MAIN: [phone] ? TOLL FREE: [phone] ?  FAX [phone] [email or web address]   Post Anesthesia Home Care Instructions  No Tylenol until after 3:00pm today if needed  Activity: Get plenty of rest for the remainder of the day. A responsible individual must stay with you for 24 hours following the procedure.  For the next 24 hours, DO NOT: -Drive a car -Paediatric nurse -Drink alcoholic beverages -Take any medication unless instructed by your physician -Make any legal decisions or sign important papers.  Meals: Start with liquid foods such as gelatin or soup. Progress to regular foods as tolerated. Avoid greasy, spicy, heavy foods. If nausea and/or vomiting occur, drink only clear liquids until the nausea and/or vomiting subsides. Call your physician if vomiting continues.  Special Instructions/Symptoms: Your throat may feel dry or sore from the anesthesia or the breathing tube placed in your throat during surgery. If this causes discomfort, gargle with warm salt water. The discomfort should disappear within 24 hours.  If you had a scopolamine patch placed behind your ear for the management of post- operative nausea and/or vomiting:  1. The medication in the patch is effective for 72 hours, after which it should be removed.  Wrap patch in a tissue and discard in the trash. Wash hands thoroughly with soap and water. 2. You may remove the patch earlier than 72 hours if you experience unpleasant side effects which may include dry mouth, dizziness or visual disturbances. 3. Avoid touching the patch.  Wash your hands with soap and water after contact with the patch.

## 2020-02-03 NOTE — Anesthesia Preprocedure Evaluation (Signed)
Anesthesia Evaluation  Patient identified by MRN, date of birth, ID band Patient awake    Reviewed: Allergy & Precautions, NPO status , Patient's Chart, lab work & pertinent test results  Airway Mallampati: II  TM Distance: >3 FB Neck ROM: Full    Dental no notable dental hx. (+) Teeth Intact, Dental Advisory Given   Pulmonary neg pulmonary ROS,    Pulmonary exam normal breath sounds clear to auscultation       Cardiovascular negative cardio ROS Normal cardiovascular exam Rhythm:Regular Rate:Normal  TTE 2020 unremarkable   Neuro/Psych negative neurological ROS  negative psych ROS   GI/Hepatic Neg liver ROS, GERD  Controlled,  Endo/Other  negative endocrine ROS  Renal/GU negative Renal ROS  negative genitourinary   Musculoskeletal negative musculoskeletal ROS (+)   Abdominal (+) + obese,   Peds  Hematology negative hematology ROS (+)   Anesthesia Other Findings Left breast CA  Reproductive/Obstetrics                             Anesthesia Physical  Anesthesia Plan  ASA: II  Anesthesia Plan: MAC   Post-op Pain Management:    Induction: Intravenous  PONV Risk Score and Plan: 2 and Ondansetron, Midazolam and Treatment may vary due to age or medical condition  Airway Management Planned: Simple Face Mask  Additional Equipment:   Intra-op Plan:   Post-operative Plan:   Informed Consent: I have reviewed the patients History and Physical, chart, labs and discussed the procedure including the risks, benefits and alternatives for the proposed anesthesia with the patient or authorized representative who has indicated his/her understanding and acceptance.     Dental advisory given  Plan Discussed with: CRNA  Anesthesia Plan Comments:         Anesthesia Quick Evaluation

## 2020-02-03 NOTE — Transfer of Care (Signed)
Immediate Anesthesia Transfer of Care Note  Patient: Natasha Huffman  Procedure(s) Performed: REMOVAL PORT-A-CATH (N/A Chest)  Patient Location: PACU  Anesthesia Type:MAC  Level of Consciousness: awake, alert  and oriented  Airway & Oxygen Therapy: Patient Spontanous Breathing and Patient connected to nasal cannula oxygen  Post-op Assessment: Report given to RN and Post -op Vital signs reviewed and stable  Post vital signs: Reviewed and stable  Last Vitals:  Vitals Value Taken Time  BP 113/84 02/03/20 0951  Temp    Pulse 74 02/03/20 0952  Resp 16 02/03/20 0952  SpO2 100 % 02/03/20 0952  Vitals shown include unvalidated device data.  Last Pain:  Vitals:   02/03/20 0901  TempSrc: Oral  PainSc: 0-No pain         Complications: No complications documented.

## 2020-02-03 NOTE — Op Note (Signed)

## 2020-02-03 NOTE — H&P (Signed)
Natasha Huffman is an 59 y.o. female.   Chief Complaint: Port in place HPI: Patient presents for port removal after completing chemotherapy for breast cancer.  She has no complaints.  Past Medical History:  Diagnosis Date  . Breast cancer ([place]) 2020   Left Breast Cancer  . Cancer ([facility]) 09/23/2018   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 ,   . Gallstones   . GERD (gastroesophageal reflux disease)    resolved   . Peripheral neuropathy due to chemotherapy ([place])   . Personal history of chemotherapy 2020   Left Breast Cancer  . Personal history of radiation therapy 2020   Left Breast Cancer    Past Surgical History:  Procedure Laterality Date  . BREAST EXCISIONAL BIOPSY Left 04/08/2019  . BREAST LUMPECTOMY Left 04/08/2019  . BREAST LUMPECTOMY WITH RADIOACTIVE SEED AND SENTINEL LYMPH NODE BIOPSY Left 04/08/2019   Procedure: LEFT BREAST RADIOACTIVE SEED LUMPECTOMY X2 AND LEFT SENTINEL LYMPH NODE MAPPING;  Surgeon: [name], MD;  Location: [facility];  Service: General;  Laterality: Left;  . [place]; 1995  . CHOLECYSTECTOMY N/A 02/22/2015   Procedure: LAPAROSCOPIC CHOLECYSTECTOMY WITH INTRAOPERATIVE CHOLANGIOGRAM;  Surgeon: [name], MD;  Location: [facility];  Service: General;  Laterality: N/A;  . LAPAROSCOPIC CHOLECYSTECTOMY  02/22/2015  . PORTACATH PLACEMENT Right 10/08/2018   Procedure: INSERTION PORT-A-CATH WITH ULTRASOUND;  Surgeon: [name], MD;  Location: [facility];  Service: General;  Laterality: Right;  . TUBAL LIGATION  1995    Family History  Problem Relation Age of Onset  . Hypertension Sister   . Colon cancer Maternal Aunt   . Breast cancer Neg Hx    Social History:  reports that she has never smoked. She has never used smokeless tobacco. She reports that she does not drink alcohol and does not use drugs.  Allergies: No Known Allergies  Medications Prior to Admission  Medication Sig Dispense Refill  . tamoxifen (NOLVADEX) 20 MG  tablet Take 20 mg by mouth daily.      No results found for this or any previous visit (from the past 48 hour(s)). No results found.  Review of Systems  All other systems reviewed and are negative.   Height 5\' 2"  (1.575 m), weight 83.9 kg. Physical Exam  HENT:  Head: Normocephalic.  Nose: Nose normal.  Eyes: Pupils are equal, round, and reactive to light.  Cardiovascular: Normal rate and regular rhythm.  Respiratory: Effort normal and breath sounds normal.  GI: Normal appearance.  Musculoskeletal:        General: Normal range of motion.     Cervical back: Normal range of motion.  Neurological: She is alert.  Skin: Skin is warm and dry.     Assessment/Plan Port removal  The procedure has been discussed with the patient.  Alternative therapies have been discussed with the patient.  Operative risks include bleeding,  Infection,  Organ injury,  Nerve injury,  Blood vessel injury,  DVT,  Pulmonary embolism,  Death,  And possible reoperation.  Medical management risks include worsening of present situation.  The success of the procedure is 50 -90 % at treating patients symptoms.  The patient understands and agrees to proceed.  Turner Daniels, MD 02/03/2020, 8:59 AM

## 2020-02-03 NOTE — Anesthesia Postprocedure Evaluation (Signed)
Anesthesia Post Note  Patient: Natasha Huffman  Procedure(s) Performed: REMOVAL PORT-A-CATH (N/A Chest)     Patient location during evaluation: PACU Anesthesia Type: MAC Level of consciousness: awake and alert Pain management: pain level controlled Vital Signs Assessment: post-procedure vital signs reviewed and stable Respiratory status: spontaneous breathing, nonlabored ventilation and respiratory function stable Cardiovascular status: blood pressure returned to baseline and stable Postop Assessment: no apparent nausea or vomiting Anesthetic complications: no   No complications documented.  Last Vitals:  Vitals:   02/03/20 1030 02/03/20 1054  BP: 107/69 113/60  Pulse: 73 71  Resp: 15 16  Temp: 36.6 C 36.5 C  SpO2: 98% 100%    Last Pain:  Vitals:   02/03/20 1054  TempSrc: Oral  PainSc: 0-No pain                 Lynda Rainwater

## 2020-02-04 ENCOUNTER — Encounter (HOSPITAL_BASED_OUTPATIENT_CLINIC_OR_DEPARTMENT_OTHER): Payer: Self-pay | Admitting: Surgery

## 2020-02-07 MED FILL — predniSONE 10 MG TABS: 10 | 5 days supply | Qty: 5 | Fill #0

## 2020-02-10 DIAGNOSIS — T7840XA Allergy, unspecified, initial encounter: Secondary | ICD-10-CM | POA: Diagnosis not present

## 2020-05-02 MED FILL — TAMOXIFEN 20 MG TABLET: 20 | 90 days supply | Qty: 90 | Fill #3

## 2020-05-15 ENCOUNTER — Other Ambulatory Visit (HOSPITAL_COMMUNITY): Payer: Self-pay | Admitting: Internal Medicine

## 2020-05-15 MED FILL — FLUARIX QUADRIVALENT 0.5 ML: 0.5 | 1 days supply | Qty: 1 | Fill #0

## 2020-05-16 ENCOUNTER — Other Ambulatory Visit: Payer: Self-pay | Admitting: *Deleted

## 2020-08-02 ENCOUNTER — Other Ambulatory Visit: Payer: Self-pay | Admitting: Oncology

## 2020-08-02 MED FILL — TAMOXIFEN 20 MG TABLET: 20 | 90 days supply | Qty: 90 | Fill #0

## 2020-08-05 IMAGING — MG DIGITAL DIAGNOSTIC BILAT W/ TOMO W/ CAD
6 of 9 series · 6 of 25 positions shown · non-contrast
Comparison: Previous exam(s).

CLINICAL DATA: History of left breast cancer status post
lumpectomy, chemotherapy and radiation therapy in 9494. Patient also
had excisional biopsy of a papilloma in the medial aspect of the
left breast.

EXAM:
DIGITAL DIAGNOSTIC BILATERAL MAMMOGRAM WITH CAD AND TOMO

[L MLO]
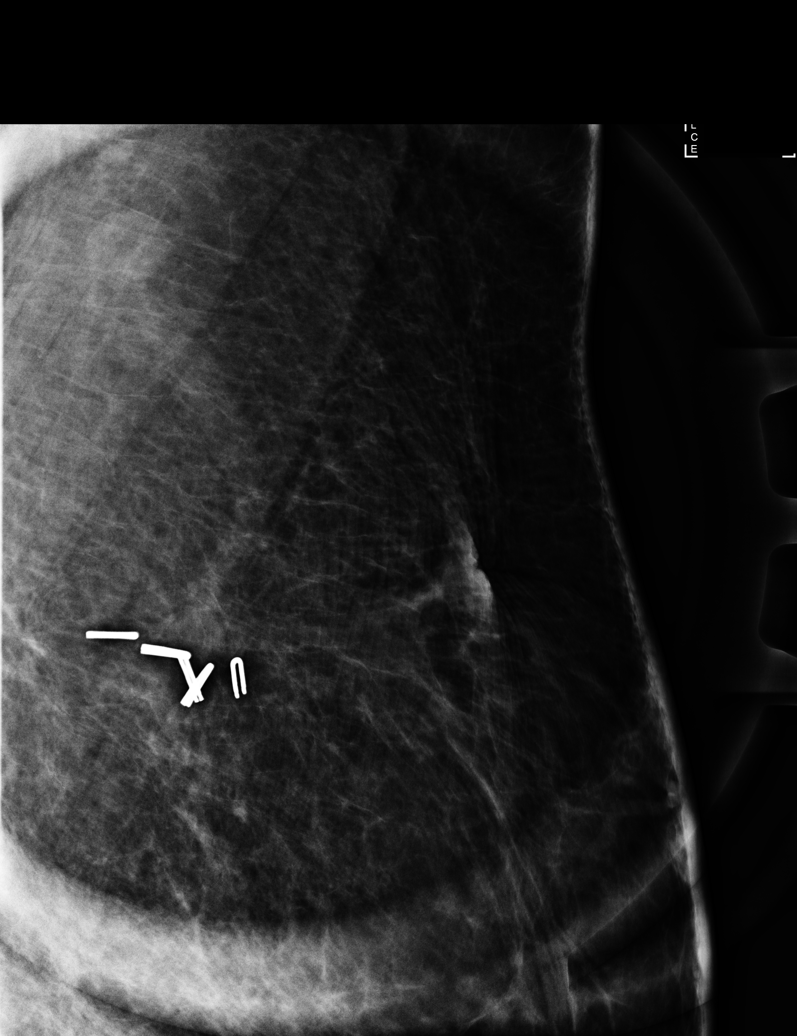

[R MLO synth-2D]
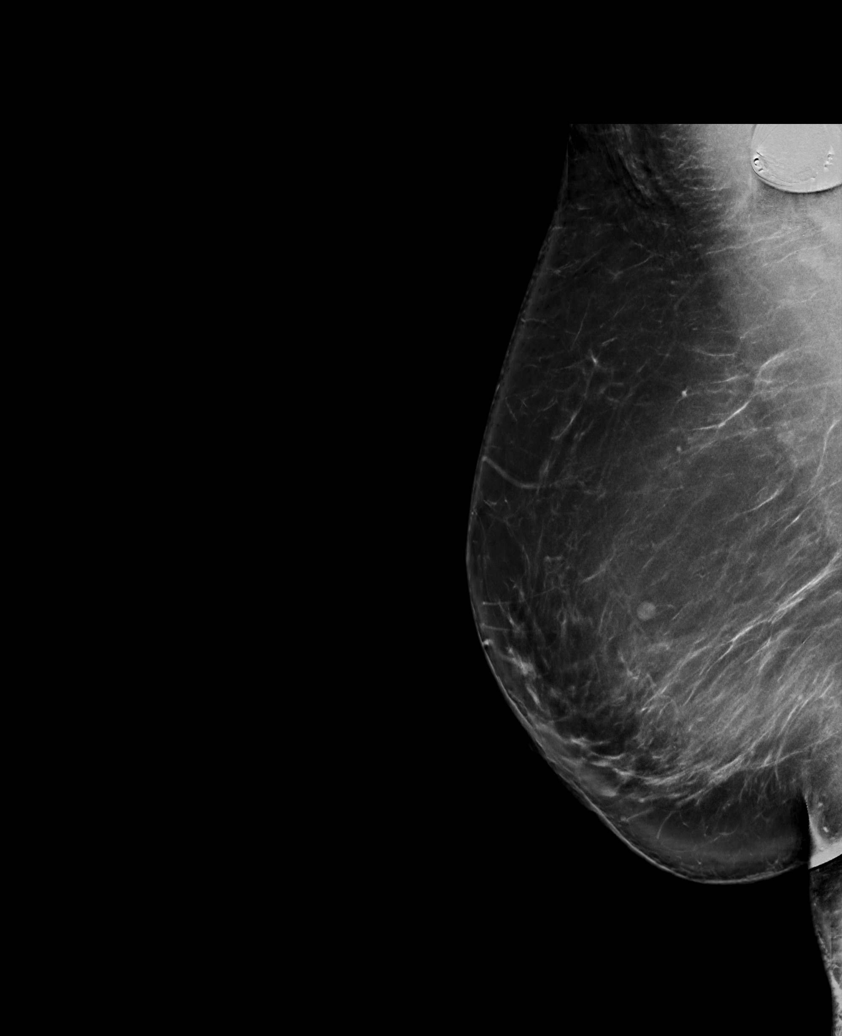

[L CC synth-2D]
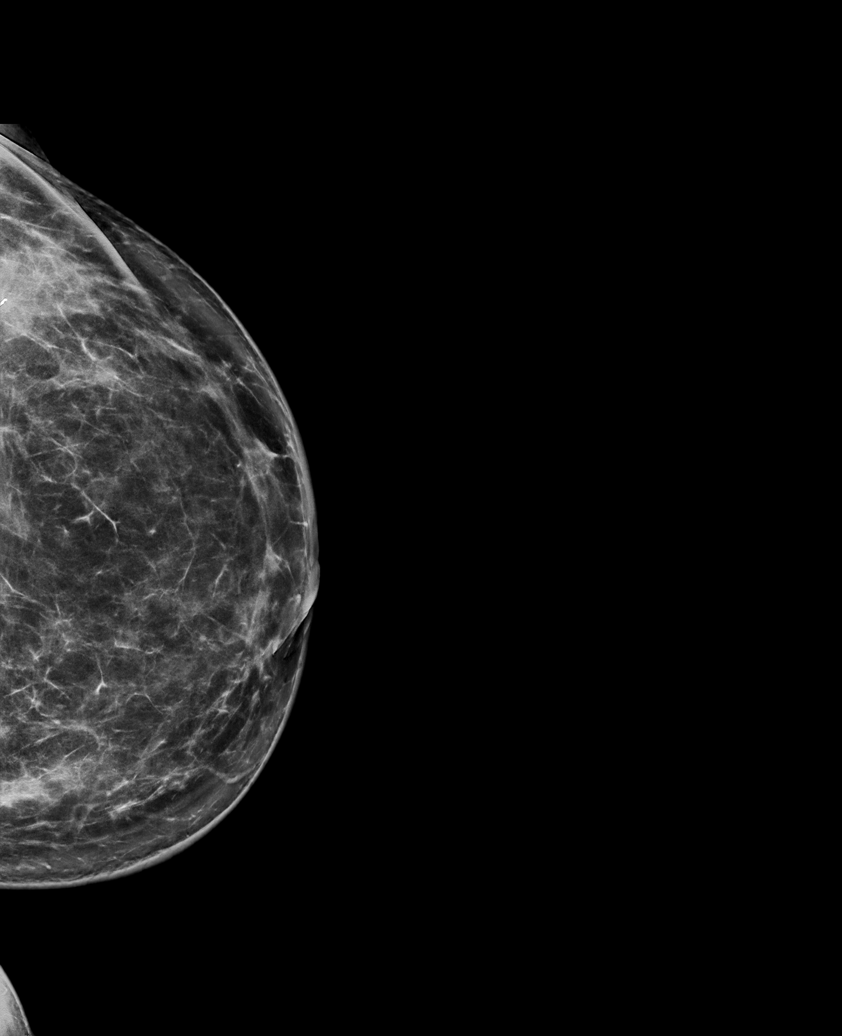

[L MLO synth-2D]
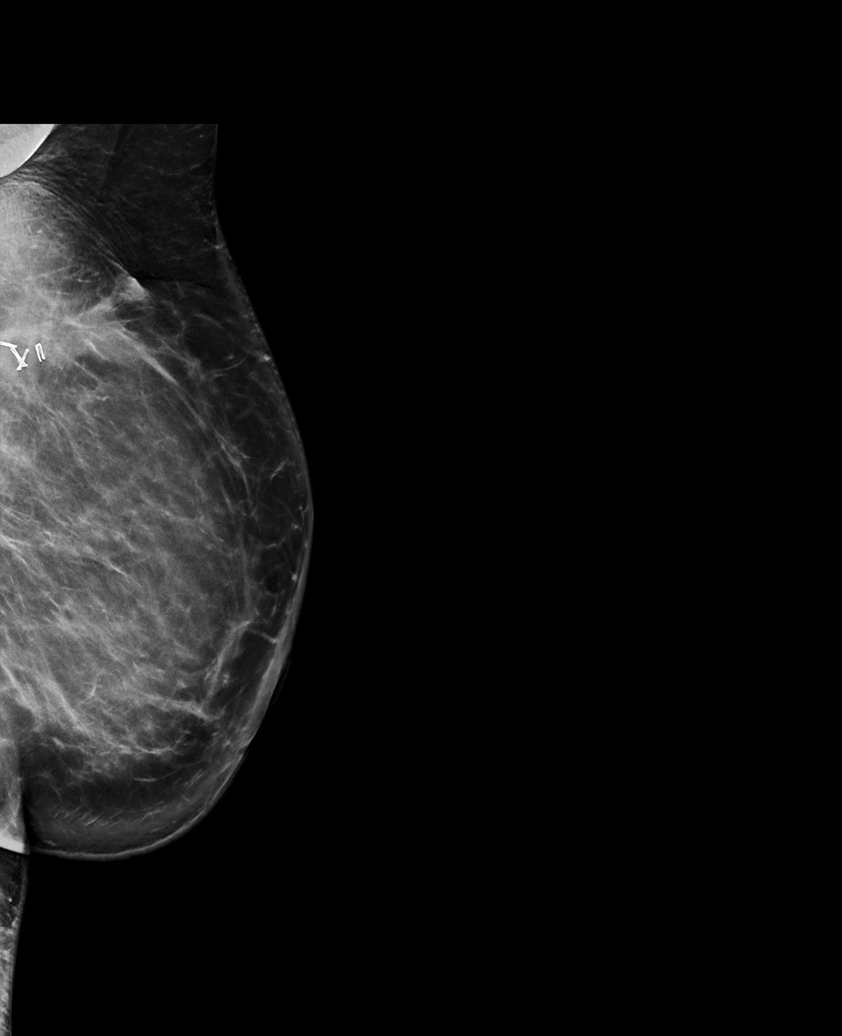

[R CC synth-2D]
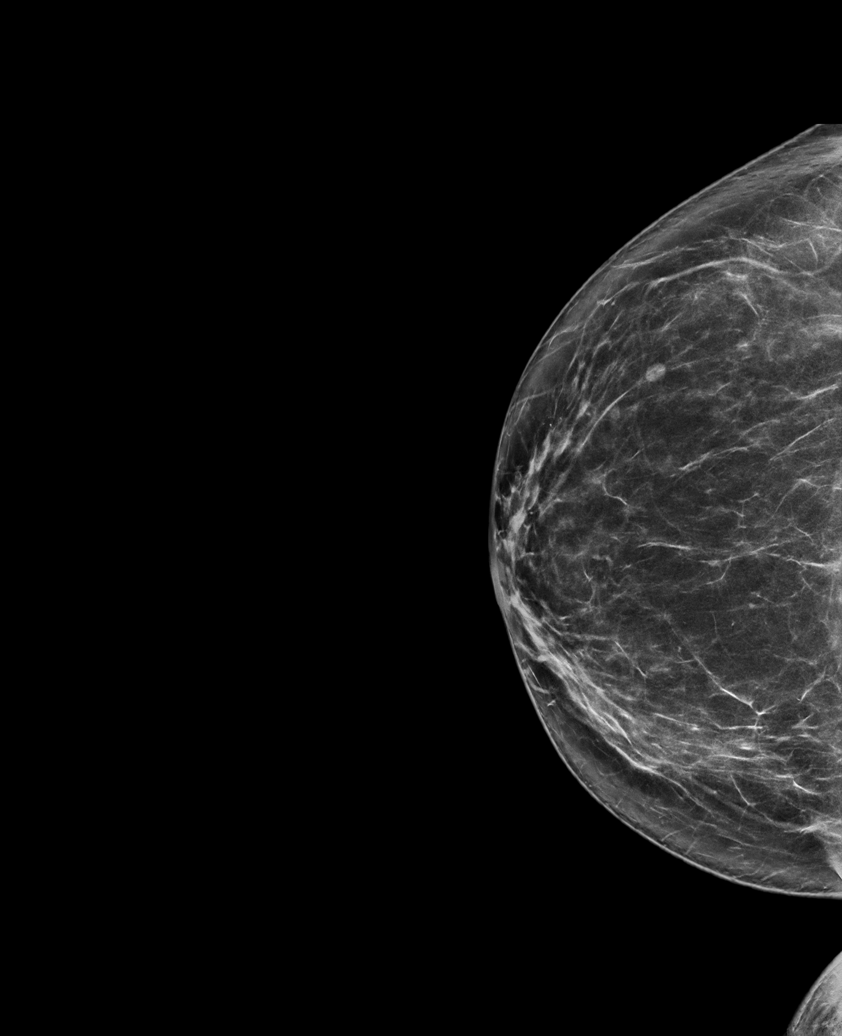

[R MLO tomo · tomo slice 53/106.0]
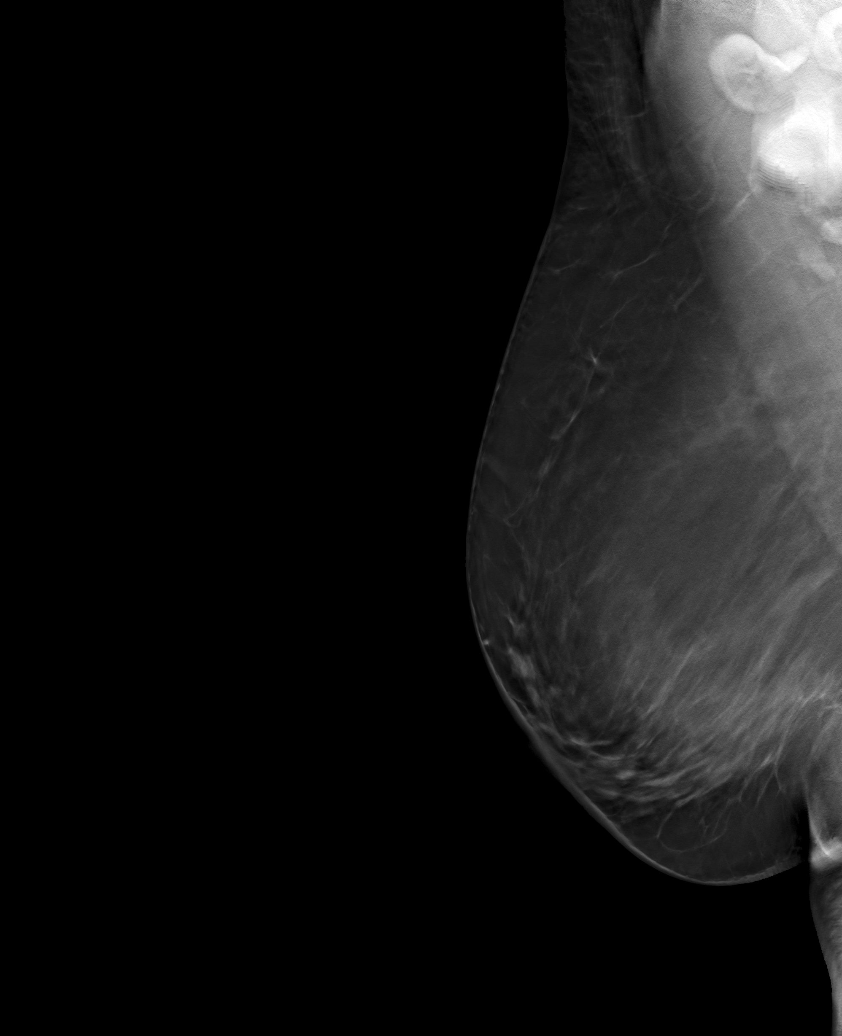

[6 of 25 positions shown; findings below may reference images not displayed]

ACR Breast Density Category b: There are scattered areas of
fibroglandular density.
FINDINGS: Lumpectomy changes are seen in the left breast. No suspicious mass
or malignant type microcalcifications identified in either breast.

Mammographic images were processed with CAD.
IMPRESSION: No evidence of malignancy in either breast.

RECOMMENDATION:
Bilateral diagnostic mammogram in 1 year is recommended.

I have discussed the findings and recommendations with the patient.
If applicable, a reminder letter will be sent to the patient
regarding the next appointment.

BI-RADS CATEGORY  2: Benign.

## 2020-08-07 DIAGNOSIS — C50912 Malignant neoplasm of unspecified site of left female breast: Secondary | ICD-10-CM | POA: Diagnosis not present

## 2020-09-18 ENCOUNTER — Encounter: Payer: Self-pay | Admitting: Oncology

## 2020-09-18 NOTE — Progress Notes (Signed)
Returned call to patient regarding balance on account and grant funds she was awarded.  Had Lenise review account as she submits claims to foundations for payment after insurance pays and was advised insurance paid later than allotted days for foundation to pay.  Advised patient of this information and to contact insurance company for clarity. She verbalized understanding. Will email to advise she may also want to contact Patient Accounting.  She has my number for any additional financial questions or concerns.

## 2020-10-10 DIAGNOSIS — R7303 Prediabetes: Secondary | ICD-10-CM | POA: Diagnosis not present

## 2020-10-10 DIAGNOSIS — Z23 Encounter for immunization: Secondary | ICD-10-CM | POA: Diagnosis not present

## 2020-10-10 DIAGNOSIS — R748 Abnormal levels of other serum enzymes: Secondary | ICD-10-CM | POA: Diagnosis not present

## 2020-10-10 DIAGNOSIS — C50919 Malignant neoplasm of unspecified site of unspecified female breast: Secondary | ICD-10-CM | POA: Diagnosis not present

## 2020-10-10 DIAGNOSIS — Z0000 Encounter for general adult medical examination without abnormal findings: Secondary | ICD-10-CM | POA: Diagnosis not present

## 2020-10-10 DIAGNOSIS — Z13220 Encounter for screening for lipoid disorders: Secondary | ICD-10-CM | POA: Diagnosis not present

## 2020-10-10 DIAGNOSIS — G479 Sleep disorder, unspecified: Secondary | ICD-10-CM | POA: Diagnosis not present

## 2020-10-16 ENCOUNTER — Other Ambulatory Visit: Payer: Self-pay | Admitting: Family Medicine

## 2020-10-16 ENCOUNTER — Other Ambulatory Visit (HOSPITAL_COMMUNITY): Payer: Self-pay | Admitting: Family Medicine

## 2020-10-16 DIAGNOSIS — R7989 Other specified abnormal findings of blood chemistry: Secondary | ICD-10-CM

## 2020-10-16 DIAGNOSIS — R748 Abnormal levels of other serum enzymes: Secondary | ICD-10-CM

## 2020-10-24 ENCOUNTER — Ambulatory Visit (HOSPITAL_COMMUNITY)
Admission: RE | Admit: 2020-10-24 | Discharge: 2020-10-24 | Disposition: A | Discharge status: Home / Self Care | Payer: 59 | Source: Ambulatory Visit | Attending: Family Medicine | Admitting: Family Medicine

## 2020-10-24 ENCOUNTER — Other Ambulatory Visit: Payer: Self-pay

## 2020-10-24 DIAGNOSIS — R7989 Other specified abnormal findings of blood chemistry: Secondary | ICD-10-CM | POA: Insufficient documentation

## 2020-10-24 DIAGNOSIS — R748 Abnormal levels of other serum enzymes: Secondary | ICD-10-CM | POA: Diagnosis not present

## 2020-10-24 DIAGNOSIS — R945 Abnormal results of liver function studies: Secondary | ICD-10-CM | POA: Diagnosis not present

## 2020-10-30 ENCOUNTER — Other Ambulatory Visit: Payer: Self-pay | Admitting: Oncology

## 2020-10-30 MED FILL — TAMOXIFEN 20 MG TABLET: 20 | 90 days supply | Qty: 90 | Fill #0

## 2020-11-01 DIAGNOSIS — R748 Abnormal levels of other serum enzymes: Secondary | ICD-10-CM | POA: Insufficient documentation

## 2020-11-01 DIAGNOSIS — Z124 Encounter for screening for malignant neoplasm of cervix: Secondary | ICD-10-CM | POA: Diagnosis not present

## 2020-11-01 DIAGNOSIS — Z01419 Encounter for gynecological examination (general) (routine) without abnormal findings: Secondary | ICD-10-CM | POA: Diagnosis not present

## 2020-11-08 DIAGNOSIS — H524 Presbyopia: Secondary | ICD-10-CM | POA: Diagnosis not present

## 2020-11-08 DIAGNOSIS — Z135 Encounter for screening for eye and ear disorders: Secondary | ICD-10-CM | POA: Diagnosis not present

## 2020-11-08 DIAGNOSIS — Z79899 Other long term (current) drug therapy: Secondary | ICD-10-CM | POA: Diagnosis not present

## 2020-11-13 ENCOUNTER — Other Ambulatory Visit: Payer: Self-pay | Admitting: Adult Health

## 2020-11-13 DIAGNOSIS — Z9889 Other specified postprocedural states: Secondary | ICD-10-CM

## 2020-11-15 DIAGNOSIS — M25531 Pain in right wrist: Secondary | ICD-10-CM | POA: Diagnosis not present

## 2020-11-27 ENCOUNTER — Other Ambulatory Visit (HOSPITAL_COMMUNITY): Payer: Self-pay

## 2020-11-27 MED ORDER — DIAZEPAM 5 MG PO TABS
5.0000 mg | ORAL_TABLET | ORAL | 0 refills | Status: DC
  Filled 2020-11-27: qty 30, 15d supply, fill #0

## 2020-12-13 ENCOUNTER — Other Ambulatory Visit: Payer: Self-pay | Admitting: Adult Health

## 2020-12-13 DIAGNOSIS — Z853 Personal history of malignant neoplasm of breast: Secondary | ICD-10-CM

## 2020-12-18 ENCOUNTER — Other Ambulatory Visit: Payer: Self-pay

## 2020-12-18 ENCOUNTER — Ambulatory Visit
Admission: RE | Admit: 2020-12-18 | Discharge: 2020-12-18 | Disposition: A | Discharge status: Home / Self Care | Payer: 59 | Source: Ambulatory Visit | Attending: Adult Health | Admitting: Adult Health

## 2020-12-18 DIAGNOSIS — Z853 Personal history of malignant neoplasm of breast: Secondary | ICD-10-CM | POA: Diagnosis not present

## 2020-12-18 DIAGNOSIS — R928 Other abnormal and inconclusive findings on diagnostic imaging of breast: Secondary | ICD-10-CM | POA: Diagnosis not present

## 2020-12-28 DIAGNOSIS — K5900 Constipation, unspecified: Secondary | ICD-10-CM | POA: Diagnosis not present

## 2020-12-28 DIAGNOSIS — R7989 Other specified abnormal findings of blood chemistry: Secondary | ICD-10-CM | POA: Diagnosis not present

## 2020-12-28 DIAGNOSIS — Z86010 Personal history of colonic polyps: Secondary | ICD-10-CM | POA: Diagnosis not present

## 2020-12-28 DIAGNOSIS — R945 Abnormal results of liver function studies: Secondary | ICD-10-CM | POA: Diagnosis not present

## 2020-12-28 DIAGNOSIS — K7460 Unspecified cirrhosis of liver: Secondary | ICD-10-CM | POA: Diagnosis not present

## 2021-01-01 ENCOUNTER — Other Ambulatory Visit: Payer: Self-pay | Admitting: Gastroenterology

## 2021-01-01 DIAGNOSIS — K7460 Unspecified cirrhosis of liver: Secondary | ICD-10-CM

## 2021-01-03 NOTE — Progress Notes (Signed)
[facility]  Telephone:([phone] [phone]) [phone]    ID: Natasha Huffman DOB: Jul 19, 1961  MR#: [id number]  WGY#:[id number]  Patient Care Team: [name], MD as PCP - General (Family Medicine) Rockwell Germany, RN as Oncology Nurse Navigator Mauro Kaufmann, RN as Oncology Nurse Navigator [name], MD as Consulting Physician (General Surgery) Chole Driver, [name], MD as Consulting Physician (Oncology) Kyung Rudd, MD as Consulting Physician (Radiation Oncology) Larey Dresser, MD as Consulting Physician (Cardiology) Ronnette Juniper, MD as Consulting Physician (Gastroenterology) OTHER MD:    CHIEF COMPLAINT: Triple positive breast cancer  CURRENT TREATMENT: Tamoxifen   INTERVAL HISTORY: Sheba returns today for follow-up of her HER-2 positive breast cancer.   She continues on tamoxifen. She is having minimal hot flashes, minimal vaginal wetness and no other side effects that she is aware of.  She underwent port removal on 02/03/2020.  She also underwent bilateral diagnostic mammography with tomography at [facility] on 12/18/2020 showing: breast density category B; no evidence of malignancy in either breast.    REVIEW OF SYSTEMS: Vashti tells me since her last visit here she was found to have hepatic steatosis this is being worked up by Dr. Therisa Doyne.  We have an ultrasound which actually does show a little bit ups scalloping on the edges of the liver suggestive of very early cirrhosis in addition to the steatosis.  There is a liver MRI pending.  With that information she has started a diet and since Rayma nondistended she has already lost about 12pounds.  She is cutting out the carbs.  She is also going to the gym more regularly   [place]: s/p Moderna x2 followed by 1 booster   HISTORY OF CURRENT ILLNESS: From the original intake note:  Melodye had routine screening mammography on 09/23/2018 showing a possible abnormality in  the left breast. She underwent unilateral left diagnostic mammography with tomography and left breast ultrasonography at [facility] on 09/23/2018 showing: Breast Density Category B. There is a mass with irregular margins in the upper-outer quadrant of the left breast. On physical exam, there is palpable focal thickening in the 2 o'clock location of the left breast. Sonographically, an irregular hypoechoic mass with internal vascularity in the 2 o'clock location of the left breast 10 cm from the nipple is seen. The mass measures 1.8 x 1.1 x 1.0 cm. There is associated posterior acoustic enhancement. Evaluation of the left axilla is negative for adenopathy.  Accordingly on 09/23/2018 she proceeded to biopsy of the left breast area in question. The pathology from this procedure showed ([id number]): invasive ductal carcinoma, grade III. Prognostic indicators significant for: estrogen receptor, 100% positive and progesterone receptor, 2% positive, both with strong staining intensity. Proliferation marker Ki67 at 40%. HER2 positive (3+) by immunohistochemistry.   The patient's subsequent history is as detailed above.   PAST MEDICAL HISTORY: Past Medical History:  Diagnosis Date  . Breast cancer ([facility]) 2020   Left Breast Cancer  . Cancer ([facility]) 09/23/2018   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 ,   . Gallstones   . GERD (gastroesophageal reflux disease)    resolved   . Peripheral neuropathy due to chemotherapy ([facility])   . Personal history of chemotherapy 2020   Left Breast Cancer  . Personal history of radiation therapy 2020   Left Breast Cancer     PAST SURGICAL HISTORY: Past Surgical History:  Procedure Laterality Date  . BREAST EXCISIONAL BIOPSY Left  04/08/2019  . BREAST LUMPECTOMY Left 04/08/2019  . BREAST LUMPECTOMY WITH RADIOACTIVE SEED AND SENTINEL LYMPH NODE BIOPSY Left 04/08/2019   Procedure: LEFT BREAST RADIOACTIVE SEED LUMPECTOMY X2 AND LEFT SENTINEL LYMPH NODE MAPPING;  Surgeon:  [name], MD;  Location: [facility];  Service: General;  Laterality: Left;  . [place]; 1995  . CHOLECYSTECTOMY N/A 02/22/2015   Procedure: LAPAROSCOPIC CHOLECYSTECTOMY WITH INTRAOPERATIVE CHOLANGIOGRAM;  Surgeon: [name], MD;  Location: [facility];  Service: General;  Laterality: N/A;  . LAPAROSCOPIC CHOLECYSTECTOMY  02/22/2015  . PORT-A-CATH REMOVAL N/A 02/03/2020   Procedure: REMOVAL PORT-A-CATH;  Surgeon: [name], MD;  Location: [facility];  Service: General;  Laterality: N/A;  . PORTACATH PLACEMENT Right 10/08/2018   Procedure: INSERTION PORT-A-CATH WITH ULTRASOUND;  Surgeon: [name], MD;  Location: [facility];  Service: General;  Laterality: Right;  . TUBAL LIGATION  1995     FAMILY HISTORY: Family History  Problem Relation Age of Onset  . Hypertension Sister   . Colon cancer Maternal Aunt   . Breast cancer Neg Hx   Dalicia's father died from sepsis at age 14. Patients' mother is 72 years old as of 09/2018. The patient has 3 brothers and 5 sisters. Patient denies anyone in her family having breast, ovarian, prostate, or pancreatic cancer. Hadessah's maternal aunt was diagnosed with colon cancer at age 63.    GYNECOLOGIC HISTORY:  No LMP recorded. Patient is postmenopausal. 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 Her husband, [name], works at a [employer] in [place]. 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 [name] has two children, [name] and [name]. [name] is 33, lives in [place], [place], and is a Animator. [name] is 25, lives in [place], and works for the [employer]. Dnya has one grandchild. She attends the [employer] in  [place]: Genifer's husband, [name], is automatically her healthcare power of attorney.     HEALTH MAINTENANCE: Social History   Tobacco Use  . Smoking status: Never Smoker  . Smokeless tobacco: Never Used  Substance Use Topics  . Alcohol use: No  . Drug use: No    Colonoscopy: yes, 2015, [facility]  PAP: 09/2017  Bone density: no   No Known Allergies  Current Outpatient Medications  Medication Sig Dispense Refill  . diazepam (VALIUM) 5 MG tablet Take 1-2 tablets (5-10 mg total) by mouth as directed 30-60 minutes before flying 30 tablet 0  . ibuprofen (ADVIL) 800 MG tablet Take 1 tablet (800 mg total) by mouth every 8 (eight) hours as needed. 30 tablet 0  . influenza vac split quadrivalent PF (FLUARIX) 0.5 ML injection TO BE ADMINISTERED BY PHARMACIST .5 mL 0  . tamoxifen (NOLVADEX) 20 MG tablet TAKE 1 TABLET (20 MG TOTAL) BY MOUTH DAILY. 90 tablet 0   No current facility-administered medications for this visit.    OBJECTIVE:  African-American woman who appears well  Vitals:   01/04/21 0915  BP: 112/76  Pulse: 79  Resp: 18  Temp: (!) 97.5 F (36.4 C)  SpO2: 100%   Body mass index is 34.33 kg/m. Wt Readings from Last 3 Encounters:  01/04/21 187 lb 11.2 oz (85.1 kg)  02/03/20 198  lb 6.6 oz (90 kg)  01/05/20 195 lb 12.8 oz (88.8 kg)  ECOG FS: 1  Sclerae unicteric, EOMs intact Wearing a mask No cervical or supraclavicular adenopathy Lungs no rales or rhonchi Heart regular rate and rhythm Abd soft, nontender, positive bowel sounds MSK no focal spinal tenderness, no upper extremity lymphedema Neuro: nonfocal, well oriented, appropriate affect Breasts: The right breast is benign.  The left breast has undergone lumpectomy and radiation.  There is still some hyperpigmentation.  Aside from that the cosmetic result is good.  There is no evidence of local recurrence.  Both axillae are benign   LAB RESULTS:  CMP     Component Value Date/Time   NA 141  01/05/2020 1216   K 4.4 01/05/2020 1216   CL 105 01/05/2020 1216   CO2 29 01/05/2020 1216   GLUCOSE 96 01/05/2020 1216   BUN 12 01/05/2020 1216   CREATININE 0.79 01/05/2020 1216   CREATININE 0.71 01/11/2019 0815   CALCIUM 9.5 01/05/2020 1216   PROT 7.8 01/05/2020 1216   ALBUMIN 3.8 01/05/2020 1216   AST 30 01/05/2020 1216   AST 19 01/11/2019 0815   ALT 33 01/05/2020 1216   ALT 42 01/11/2019 0815   ALKPHOS 99 01/05/2020 1216   BILITOT 0.3 01/05/2020 1216   BILITOT 0.3 01/11/2019 0815   GFRNONAA >60 01/05/2020 1216   GFRNONAA >60 01/11/2019 0815   GFRAA >60 01/05/2020 1216   GFRAA >60 01/11/2019 0815    No results found for: TOTALPROTELP, ALBUMINELP, A1GS, A2GS, BETS, BETA2SER, GAMS, MSPIKE, SPEI  No results found for: KPAFRELGTCHN, LAMBDASER, KAPLAMBRATIO  Lab Results  Component Value Date   WBC 3.9 (L) 01/04/2021   NEUTROABS 2.0 01/04/2021   HGB 11.3 (L) 01/04/2021   HCT 35.6 (L) 01/04/2021   MCV 82.8 01/04/2021   PLT 239 01/04/2021   No results found for: LABCA2  No components found for: DXIPJA250  No results for input(s): INR in the last 168 hours.  No results found for: LABCA2  No results found for: NLZ767  No results found for: HAL937  No results found for: TKW409  No results found for: CA2729  No components found for: HGQUANT  No results found for: CEA1 / No results found for: CEA1   No results found for: AFPTUMOR  No results found for: CHROMOGRNA  No results found for: HGBA, HGBA2QUANT, HGBFQUANT, HGBSQUAN (Hemoglobinopathy evaluation)   No results found for: LDH  No results found for: IRON, TIBC, IRONPCTSAT (Iron and TIBC)  No results found for: FERRITIN  Urinalysis    Component Value Date/Time   COLORURINE YELLOW 02/22/2015 0118   APPEARANCEUR CLOUDY (A) 02/22/2015 0118   LABSPEC 1.019 02/22/2015 0118   PHURINE 7.0 02/22/2015 0118   GLUCOSEU NEGATIVE 02/22/2015 0118   HGBUR NEGATIVE 02/22/2015 0118   BILIRUBINUR NEGATIVE  02/22/2015 0118   KETONESUR NEGATIVE 02/22/2015 0118   PROTEINUR NEGATIVE 02/22/2015 0118   UROBILINOGEN 1.0 02/22/2015 0118   NITRITE NEGATIVE 02/22/2015 0118   LEUKOCYTESUR NEGATIVE 02/22/2015 0118    STUDIES:  MM DIAG BREAST TOMO BILATERAL  Result Date: 12/18/2020 CLINICAL DATA:  LEFT lumpectomy with radiation and chemotherapy in August 2020. EXAM: DIGITAL DIAGNOSTIC BILATERAL MAMMOGRAM WITH TOMOSYNTHESIS AND CAD TECHNIQUE: Bilateral digital diagnostic mammography and breast tomosynthesis was performed. The images were evaluated with computer-aided detection. COMPARISON:  Previous exam(s). ACR Breast Density Category b: There are scattered areas of fibroglandular density. FINDINGS: Post operative changes are seen in the LEFTbreast. No suspicious mass, distortion, or microcalcifications are  identified to suggest presence of malignancy. IMPRESSION: No mammographic evidence for malignancy. RECOMMENDATION: Diagnostic mammogram is suggested in 1 year. (Code:[id number]) I have discussed the findings and recommendations with the patient. If applicable, a reminder letter will be sent to the patient regarding the next appointment. BI-RADS CATEGORY  2: Benign. Electronically Signed   By: Nolon Nations M.D.   On: 12/18/2020 16:13     ELIGIBLE FOR AVAILABLE RESEARCH PROTOCOL: no   ASSESSMENT: 60 y.o. [name], [place] woman status post left breast upper outer quadrant biopsy 09/23/2018 for a clinical T1c N0, stage IA invasive ductal carcinoma, grade 3, triple positive, with an MIB-1 of 40%  (1) neoadjuvant chemotherapy consisting of carboplatin, docetaxel, and trastuzumab started 10/22/2018, to be repeated every 21 days x 6  (a) echocardiogram on 10/07/2018 shows a LVEF of 60-65%.  (b) docetaxel discontinued and gemcitabine substituted with cycle 5 because of neuropathy  (2) trastuzumab to continue to complete a year   (a) echocardiogram 01/25/2019 showed an ejection fraction of greater than 65%  (b)  trastuzumab changed to T-DM1 post-op  (3) status post left lumpectomy and sentinel lymph node sampling 04/08/2019 showing a residual ypT1c ypN0 residual invasive ductal carcinoma, grade 3, margins negative, again HER-2 positive  (a) 4 lymph nodes were negative  (4) adjuvant TDM-1 started 05/18/2019 due to residual disease after neoadjuvant chemotherapy, completed 7 cycles 10/12/2019  (a) final echo 11/24/2019 showed an ejection fraction in the 55-60% range (4) adjuvant radiation 05/12/2019 - 06/25/2019  (a) left breast / 50.4 Gy in 28 fractions  (b) seroma boost / 10 Gy in 5 fractions  (5) started tamoxifen August 13, 2019   PLAN: Anntonette he is just about 2 years out from definitive surgery for her breast cancer with no evidence of disease recurrence.  This is very favorable.  She is tolerating tamoxifen well and the plan is to continue that a minimum of 5 years.  She sees Dr. Brantley Stage usually around February and she sees her primary care physician Dr. [name] February or March.  I think it would be best if she saw [place] in September and since I just am seeing her right now that would be September 2023.  We will continue to follow her yearly from that point  We did discuss hepatic steatosis and I commended her diet and exercise program.  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  I think she would be terrific in that role  Total encounter time 25 minutes.Sarajane Jews C. Letanya Froh, MD  01/04/21 9:32 AM Medical Oncology and Hematology [facility], [place] [address] Tel. [phone]    Fax. [phone]   I, Wilburn Mylar, am acting as scribe for Dr. [name]. Kynlei Piontek.  I, Lurline Del MD, have reviewed the above documentation for accuracy and completeness, and I agree with the above.   *Total Encounter Time as defined by the Centers for Medicare and Medicaid Services includes, in addition to the face-to-face time of a  patient visit (documented in the note above) non-face-to-face time: obtaining and reviewing outside history, ordering and reviewing medications, tests or procedures, care coordination (communications with other health care professionals or caregivers) and documentation in the medical record.

## 2021-01-04 ENCOUNTER — Other Ambulatory Visit: Payer: Self-pay

## 2021-01-04 ENCOUNTER — Inpatient Hospital Stay: Payer: 59

## 2021-01-04 ENCOUNTER — Inpatient Hospital Stay: Payer: 59 | Attending: Oncology | Admitting: Oncology

## 2021-01-04 VITALS — BP 112/76 | HR 79 | Temp 97.5°F | Resp 18 | Ht 62.0 in | Wt 187.7 lb

## 2021-01-04 DIAGNOSIS — Z794 Long term (current) use of insulin: Secondary | ICD-10-CM | POA: Insufficient documentation

## 2021-01-04 DIAGNOSIS — E109 Type 1 diabetes mellitus without complications: Secondary | ICD-10-CM | POA: Diagnosis not present

## 2021-01-04 DIAGNOSIS — Z79810 Long term (current) use of selective estrogen receptor modulators (SERMs): Secondary | ICD-10-CM | POA: Insufficient documentation

## 2021-01-04 DIAGNOSIS — C50412 Malignant neoplasm of upper-outer quadrant of left female breast: Secondary | ICD-10-CM | POA: Insufficient documentation

## 2021-01-04 DIAGNOSIS — Z9221 Personal history of antineoplastic chemotherapy: Secondary | ICD-10-CM | POA: Insufficient documentation

## 2021-01-04 DIAGNOSIS — Z79899 Other long term (current) drug therapy: Secondary | ICD-10-CM | POA: Diagnosis not present

## 2021-01-04 DIAGNOSIS — K760 Fatty (change of) liver, not elsewhere classified: Secondary | ICD-10-CM | POA: Diagnosis not present

## 2021-01-04 DIAGNOSIS — Z923 Personal history of irradiation: Secondary | ICD-10-CM | POA: Diagnosis not present

## 2021-01-04 DIAGNOSIS — Z170 Estrogen receptor positive status [ER+]: Secondary | ICD-10-CM

## 2021-01-04 LAB — CBC WITH DIFFERENTIAL/PLATELET
Abs Immature Granulocytes: 0 10*3/uL (ref 0.00–0.07)
Basophils Absolute: 0 10*3/uL (ref 0.0–0.1)
Basophils Relative: 1 %
Eosinophils Absolute: 0.1 10*3/uL (ref 0.0–0.5)
Eosinophils Relative: 2 %
HCT: 35.6 % — ABNORMAL LOW (ref 36.0–46.0)
Hemoglobin: 11.3 g/dL — ABNORMAL LOW (ref 12.0–15.0)
Immature Granulocytes: 0 %
Lymphocytes Relative: 38 %
Lymphs Abs: 1.5 10*3/uL (ref 0.7–4.0)
MCH: 26.3 pg (ref 26.0–34.0)
MCHC: 31.7 g/dL (ref 30.0–36.0)
MCV: 82.8 fL (ref 80.0–100.0)
Monocytes Absolute: 0.3 10*3/uL (ref 0.1–1.0)
Monocytes Relative: 8 %
Neutro Abs: 2 10*3/uL (ref 1.7–7.7)
Neutrophils Relative %: 51 %
Platelets: 239 10*3/uL (ref 150–400)
RBC: 4.3 MIL/uL (ref 3.87–5.11)
RDW: 13.8 % (ref 11.5–15.5)
WBC: 3.9 10*3/uL — ABNORMAL LOW (ref 4.0–10.5)
nRBC: 0 % (ref 0.0–0.2)

## 2021-01-04 LAB — COMPREHENSIVE METABOLIC PANEL
ALT: 85 U/L — ABNORMAL HIGH (ref 0–44)
AST: 63 U/L — ABNORMAL HIGH (ref 15–41)
Albumin: 3.8 g/dL (ref 3.5–5.0)
Alkaline Phosphatase: 82 U/L (ref 38–126)
Anion gap: 9 (ref 5–15)
BUN: 12 mg/dL (ref 6–20)
CO2: 25 mmol/L (ref 22–32)
Calcium: 9 mg/dL (ref 8.9–10.3)
Chloride: 108 mmol/L (ref 98–111)
Creatinine, Ser: 0.77 mg/dL (ref 0.44–1.00)
GFR, Estimated: >60 mL/min (ref >60)
Glucose, Bld: 92 mg/dL (ref 70–99)
Potassium: 4.1 mmol/L (ref 3.5–5.1)
Sodium: 142 mmol/L (ref 135–145)
Total Bilirubin: 0.5 mg/dL (ref 0.3–1.2)
Total Protein: 7.1 g/dL (ref 6.5–8.1)

## 2021-01-05 ENCOUNTER — Ambulatory Visit
Admission: RE | Admit: 2021-01-05 | Discharge: 2021-01-05 | Disposition: A | Discharge status: Home / Self Care | Payer: 59 | Source: Ambulatory Visit | Attending: Gastroenterology | Admitting: Gastroenterology

## 2021-01-05 DIAGNOSIS — R748 Abnormal levels of other serum enzymes: Secondary | ICD-10-CM | POA: Diagnosis not present

## 2021-01-05 DIAGNOSIS — K7460 Unspecified cirrhosis of liver: Secondary | ICD-10-CM

## 2021-01-05 MED ORDER — GADOBENATE DIMEGLUMINE 529 MG/ML IV SOLN
17.0000 mL | Freq: Once | INTRAVENOUS | Status: AC | PRN
  Administered 2021-01-05: 17 mL via INTRAVENOUS

## 2021-01-10 DIAGNOSIS — Z23 Encounter for immunization: Secondary | ICD-10-CM | POA: Diagnosis not present

## 2021-01-17 ENCOUNTER — Other Ambulatory Visit (HOSPITAL_COMMUNITY): Payer: Self-pay

## 2021-01-17 ENCOUNTER — Encounter: Payer: Self-pay | Admitting: Oncology

## 2021-01-17 DIAGNOSIS — M654 Radial styloid tenosynovitis [de Quervain]: Secondary | ICD-10-CM | POA: Diagnosis not present

## 2021-01-17 MED ORDER — DICLOFENAC SODIUM 75 MG PO TBEC
75.0000 mg | DELAYED_RELEASE_TABLET | Freq: Two times a day (BID) | ORAL | 0 refills | Status: DC
  Filled 2021-01-17: qty 60, 30d supply, fill #0

## 2021-01-30 DIAGNOSIS — D485 Neoplasm of uncertain behavior of skin: Secondary | ICD-10-CM | POA: Diagnosis not present

## 2021-01-30 DIAGNOSIS — D489 Neoplasm of uncertain behavior, unspecified: Secondary | ICD-10-CM | POA: Diagnosis not present

## 2021-02-05 ENCOUNTER — Other Ambulatory Visit (HOSPITAL_COMMUNITY): Payer: Self-pay

## 2021-02-05 ENCOUNTER — Other Ambulatory Visit: Payer: Self-pay | Admitting: Adult Health

## 2021-02-05 MED ORDER — TAMOXIFEN CITRATE 20 MG PO TABS
20.0000 mg | ORAL_TABLET | Freq: Every day | ORAL | 3 refills | Status: DC
  Filled 2021-02-05: qty 90, 90d supply, fill #0
  Filled 2021-05-07: qty 90, 90d supply, fill #1
  Filled 2021-08-05: qty 90, 90d supply, fill #2
  Filled 2021-11-08: qty 90, 90d supply, fill #3

## 2021-02-05 NOTE — Progress Notes (Signed)
Refill of Tamoxifen per patient request.  She is tolerating it well and her follow-ups are up to date.  Wilber Bihari, NP

## 2021-02-06 DIAGNOSIS — R7989 Other specified abnormal findings of blood chemistry: Secondary | ICD-10-CM | POA: Diagnosis not present

## 2021-02-22 ENCOUNTER — Other Ambulatory Visit (HOSPITAL_COMMUNITY): Payer: Self-pay

## 2021-02-22 MED ORDER — FLUCONAZOLE 150 MG PO TABS
150.0000 mg | ORAL_TABLET | Freq: Once | ORAL | 0 refills | Status: AC
  Filled 2021-02-22: qty 1, 1d supply, fill #0

## 2021-02-27 ENCOUNTER — Ambulatory Visit (HOSPITAL_BASED_OUTPATIENT_CLINIC_OR_DEPARTMENT_OTHER): Payer: 59 | Attending: Family Medicine | Admitting: Physical Therapy

## 2021-02-27 ENCOUNTER — Encounter (HOSPITAL_BASED_OUTPATIENT_CLINIC_OR_DEPARTMENT_OTHER): Payer: Self-pay | Admitting: Physical Therapy

## 2021-02-27 ENCOUNTER — Other Ambulatory Visit: Payer: Self-pay

## 2021-02-27 DIAGNOSIS — M25531 Pain in right wrist: Secondary | ICD-10-CM | POA: Diagnosis not present

## 2021-02-27 DIAGNOSIS — M6281 Muscle weakness (generalized): Secondary | ICD-10-CM | POA: Diagnosis not present

## 2021-02-27 DIAGNOSIS — N390 Urinary tract infection, site not specified: Secondary | ICD-10-CM | POA: Diagnosis not present

## 2021-02-27 DIAGNOSIS — M25631 Stiffness of right wrist, not elsewhere classified: Secondary | ICD-10-CM | POA: Insufficient documentation

## 2021-02-27 DIAGNOSIS — C50919 Malignant neoplasm of unspecified site of unspecified female breast: Secondary | ICD-10-CM | POA: Diagnosis not present

## 2021-02-28 NOTE — Therapy (Signed)
[place], [place], [address] Phone: [phone]   Fax:  [phone]  Physical Therapy Evaluation  Patient Details  Name: Natasha Huffman MRN: [id number] Date of Birth: [birth year] Referring Provider (PT): [name] MD   Encounter Date: 02/27/2021   PT End of Session - 02/27/21 1448     Visit Number 1    Number of Visits 12    Date for PT Re-Evaluation 04/10/21    PT Start Time 1320    PT Stop Time 1415    PT Time Calculation (min) 55 min    Activity Tolerance Patient tolerated treatment well;Other (comment)   patient reported slight increase in pain   Behavior During Therapy [facility] for tasks assessed/performed             Past Medical History:  Diagnosis Date   Breast cancer ([place]) 2020   Left Breast Cancer   Cancer ([place]) 09/23/2018   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)    Personal history of chemotherapy 2020   Left Breast Cancer   Personal history of radiation therapy 2020   Left Breast Cancer    Past Surgical History:  Procedure Laterality Date   BREAST EXCISIONAL BIOPSY Left 04/08/2019   BREAST LUMPECTOMY Left 04/08/2019   BREAST LUMPECTOMY WITH RADIOACTIVE SEED AND SENTINEL LYMPH NODE BIOPSY Left 04/08/2019   Procedure: LEFT BREAST RADIOACTIVE SEED LUMPECTOMY X2 AND LEFT SENTINEL LYMPH NODE MAPPING;  Surgeon: [name], MD;  Location: [place];  Service: General;  Laterality: Left;   [place]; 1995   CHOLECYSTECTOMY N/A 02/22/2015   Procedure: LAPAROSCOPIC CHOLECYSTECTOMY WITH INTRAOPERATIVE CHOLANGIOGRAM;  Surgeon: [name], MD;  Location: [place];  Service: General;  Laterality: N/A;   LAPAROSCOPIC CHOLECYSTECTOMY  02/22/2015   PORT-A-CATH REMOVAL N/A 02/03/2020   Procedure: REMOVAL PORT-A-CATH;  Surgeon: [name],  MD;  Location: [place];  Service: General;  Laterality: N/A;   PORTACATH PLACEMENT Right 10/08/2018   Procedure: INSERTION PORT-A-CATH WITH ULTRASOUND;  Surgeon: [name], MD;  Location: [place];  Service: General;  Laterality: Right;   TUBAL LIGATION  1995    There were no vitals filed for this visit.    Subjective Assessment - 02/27/21 1326     Subjective Patient reports for right side thumb and wrist pain that is most aggrevated with supination movements. Patient stated that her symptoms began on March/April 2022 after packing and carrying heavy object while helping family member move. 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 Patient saw MD on June 13. MD prescribed diclofenac but patient reports she is has not taking medication. MD prescribed patient a thumb keeper-splint 1 month ago on June, 13, 2022 and states that with the brace she is able to work with decreased pain but limits mobility of thumb. MD recommended Voltaren gel for patient. Patient has not purchased Voltaren gel yet. Patient is eager to decrease pain and improve strength of thumb in order to return to past activities (picking up grandson, bowling).    Pertinent History breast cancer (remission: may 2022 last check ), neuropathy in feet that has subsided/resolved (originally from chemo)    Limitations Reading;Lifting;House hold activities    How long can you sit comfortably? n/a    How long can  you stand comfortably? n/a    How long can you walk comfortably? n/a    Diagnostic tests none    Patient Stated Goals return to activities of daily living with no pain, return to bowling with decreased pain    Currently in Pain? Yes    Pain Score 4    at 6/10 after work   Pain Location Hand    Pain Orientation Right    Pain Descriptors / Indicators Dull;Aching;Sharp    Pain Type Chronic pain    Pain Radiating Towards n/a    Pain Onset More than a month ago    Pain  Frequency Intermittent    Aggravating Factors  work (pain at 6/10 after work), ADL's, lifting heavy objects.    Pain Relieving Factors medicine but states not consistently taking, ice, rest, gate-keeper splint    Effect of Pain on Daily Activities ADL's (toileting) , working. can play with family.    Multiple Pain Sites No                            Objective measurements completed on examination: See above findings.               PT Education - 02/27/21 1447     Education Details Patient was educated on inital HEP and what physical therapy will entail. Emphasis monitoring on pain with HEP.  Pt received a HEP handout and instructed on correct form and appropriate frequency.  Instructed pt to use ice when she returned home.  She declined ice in th clinic.  HEP CODE: [id number]    Person(s) Educated Patient    Methods Explanation;Demonstration;Tactile cues;Verbal cues    Comprehension Verbalized understanding;Returned demonstration;Verbal cues required;Tactile cues required              PT Short Term Goals - 02/27/21 1711       PT SHORT TERM GOAL #1   Title Patient will show 30% improvement in pain with work related activties.    Baseline 6/10 at end of day    Time 3    Period Weeks    Status New    Target Date 03/20/21      PT SHORT TERM GOAL #2   Title Patient will improve hand dynometer strength by 5 lbs.    Baseline right 35, left 45    Time 3    Period Weeks    Status New    Target Date 03/20/21      PT SHORT TERM GOAL #3   Title Patient will increase range of motion of wrist to 85% compared to left hand.    Baseline AROM  64 right, 78 left    Time 3    Period Weeks    Status New    Target Date 03/20/21      PT SHORT TERM GOAL #4   Title Pt will be independent and compliant with HEP for improved pain, ROM, strength, and function.    Time 3    Period Weeks    Status New    Target Date 03/20/21               PT Long Term  Goals - 02/27/21 1655       PT LONG TERM GOAL #1   Title Patient will improve right hand supination range of motion in order to perform ADL's such as toileting.    Baseline right 52, left 75  Time 6    Period Weeks    Status New    Target Date 04/10/21      PT LONG TERM GOAL #2   Title Patient will perform work related activities and have less than 2/10 pain at end of work day.    Baseline 6/10 at end of work day    Time 6    Period Weeks    Status New    Target Date 04/10/21      PT LONG TERM GOAL #3   Title Patient will demostrated equal strength in right wrist flexion, radial/ulner deviation, and hand grip equal to that of left in order to carry grandson safely.    Baseline right radial deviation 3+/5, right hand supination 4/5    Time 6    Period Weeks    Status New    Target Date 04/10/21                    Plan - 02/27/21 1450     Clinical Impression Statement Patient reports with right sided thumb and wrist pain ([place] Tenosynovitis/Radial styloid tenosynvitis) with insidious onset on March/April 2022. Patient presents with right hand/wrist/thumb weakness, pain, and lack of range of motion throughout right wrist/hand/thumb. Patient will continue to use right gate keeper splint as prescribed by MD. Patient tolerated treatment well during visit but reported slight increase in pain after measuring strength and ROM of right wrist. Therapy should focus on decreasing tightness and pain on right thumb/wrist musculature. Patient will benefit from physical therapy to implement HEP to increase strength and range of motion on right hand.    Personal Factors and Comorbidities Comorbidity 1    Comorbidities breast cancer (2020 - remission), last check up was May 2022    Examination-Activity Limitations Caring for Others;Toileting;Dressing;Lift    Examination-Participation Restrictions Community Activity;Occupation    Stability/Clinical Decision Making  Stable/Uncomplicated    Clinical Decision Making Low    Rehab Potential Good    PT Frequency 2x / week    PT Duration 6 weeks    PT Treatment/Interventions ADLs/Self Care Home Management;Cryotherapy;Electrical Stimulation;Iontophoresis 4mg /ml Dexamethasone;Moist Heat;Ultrasound;Therapeutic exercise;Therapeutic activities;Patient/family education;Manual techniques;Passive range of motion;Dry needling;Splinting;Taping;Joint Manipulations;Neuromuscular re-education    PT Next Visit Plan Manuel therapy to decrease tightness and pain of wrist and thumb musculature. Stretches and thera-ex to improve range of motion on right (flexion, supination, radial/ulner deviation), HEP to increase strength of thumb and wrist strenght in left hand. Possible use of ultrasound/iontophoresis to promote healing, cryotherapy for pain mangement    PT Home Exercise Plan Thumb Opposition 1-2x10 reps, Thumb Flexion 1-2 times a day x 10 reps, rubberband hand exercise 2x10.   HEP CODE: [id number]    Consulted and Agree with Plan of Care Patient             Patient will benefit from skilled therapeutic intervention in order to improve the following deficits and impairments:  Decreased activity tolerance, Decreased range of motion, Decreased endurance, Decreased strength, Impaired perceived functional ability, Impaired UE functional use, Pain, Increased fascial restricitons, Hypomobility  Visit Diagnosis: Pain in right wrist - Plan: PT plan of care cert/re-cert  Stiffness of right wrist, not elsewhere classified - Plan: PT plan of care cert/re-cert  Muscle weakness (generalized) - Plan: PT plan of care cert/re-cert     Problem List Patient Active Problem List   Diagnosis Date Noted   Hepatic steatosis 01/04/2021   Neuropathy due to chemotherapeutic drug ([place]) 03/16/2019   Port-A-Cath in  place 01/11/2019   Chemotherapy induced neutropenia ([place]) 12/22/2018   Malignant neoplasm of upper-outer quadrant of left breast  in female, estrogen receptor positive ([place]) 09/28/2018   Acute calculous cholecystitis 02/22/2015   Knee pain 05/04/2012    Selinda Michaels III PT, DPT 02/28/21 12:49 PM   During this treatment session, the therapist was present, participating in and directing the treatment. Billey Co, SPT 02/28/21  [place], [place], [address] Phone: [phone]   Fax:  [phone]  Name: Natasha Huffman MRN: [id number] Date of Birth: 08-04-1961

## 2021-03-02 ENCOUNTER — Other Ambulatory Visit (HOSPITAL_COMMUNITY): Payer: Self-pay

## 2021-03-02 DIAGNOSIS — D489 Neoplasm of uncertain behavior, unspecified: Secondary | ICD-10-CM | POA: Diagnosis not present

## 2021-03-02 DIAGNOSIS — L905 Scar conditions and fibrosis of skin: Secondary | ICD-10-CM | POA: Diagnosis not present

## 2021-03-02 DIAGNOSIS — L309 Dermatitis, unspecified: Secondary | ICD-10-CM | POA: Diagnosis not present

## 2021-03-02 MED ORDER — TRIAMCINOLONE ACETONIDE 0.5 % EX CREA
1.0000 "application " | TOPICAL_CREAM | Freq: Two times a day (BID) | CUTANEOUS | 0 refills | Status: DC | PRN
  Filled 2021-03-02: qty 30, 15d supply, fill #0

## 2021-03-27 ENCOUNTER — Ambulatory Visit (HOSPITAL_BASED_OUTPATIENT_CLINIC_OR_DEPARTMENT_OTHER): Payer: 59 | Admitting: Physical Therapy

## 2021-03-29 ENCOUNTER — Ambulatory Visit (HOSPITAL_BASED_OUTPATIENT_CLINIC_OR_DEPARTMENT_OTHER): Payer: 59 | Admitting: Physical Therapy

## 2021-04-03 ENCOUNTER — Other Ambulatory Visit: Payer: Self-pay

## 2021-04-03 ENCOUNTER — Encounter (HOSPITAL_BASED_OUTPATIENT_CLINIC_OR_DEPARTMENT_OTHER): Payer: Self-pay | Admitting: Physical Therapy

## 2021-04-03 ENCOUNTER — Ambulatory Visit (HOSPITAL_BASED_OUTPATIENT_CLINIC_OR_DEPARTMENT_OTHER): Payer: 59 | Attending: Family Medicine | Admitting: Physical Therapy

## 2021-04-03 DIAGNOSIS — M25531 Pain in right wrist: Secondary | ICD-10-CM | POA: Diagnosis not present

## 2021-04-03 DIAGNOSIS — M25631 Stiffness of right wrist, not elsewhere classified: Secondary | ICD-10-CM | POA: Insufficient documentation

## 2021-04-03 DIAGNOSIS — M6281 Muscle weakness (generalized): Secondary | ICD-10-CM | POA: Insufficient documentation

## 2021-04-04 NOTE — Therapy (Signed)
[place], [place], [address] Phone: [phone]   Fax:  [phone]  Physical Therapy Treatment  Patient Details  Name: Natasha Huffman MRN: [id number] Date of Birth: 1960-11-22 Referring Provider (PT): [name] MD   Encounter Date: 04/03/2021   PT End of Session - 04/03/21 2351     Visit Number 2    Number of Visits 12    Date for PT Re-Evaluation 04/10/21    PT Start Time [id number]    PT Stop Time 1237    PT Time Calculation (min) 41 min    Activity Tolerance Patient tolerated treatment well;Other (comment)    Behavior During Therapy [facility] for tasks assessed/performed             Past Medical History:  Diagnosis Date   Breast cancer ([place]) 2020   Left Breast Cancer   Cancer ([place]) 09/23/2018   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)    Personal history of chemotherapy 2020   Left Breast Cancer   Personal history of radiation therapy 2020   Left Breast Cancer    Past Surgical History:  Procedure Laterality Date   BREAST EXCISIONAL BIOPSY Left 04/08/2019   BREAST LUMPECTOMY Left 04/08/2019   BREAST LUMPECTOMY WITH RADIOACTIVE SEED AND SENTINEL LYMPH NODE BIOPSY Left 04/08/2019   Procedure: LEFT BREAST RADIOACTIVE SEED LUMPECTOMY X2 AND LEFT SENTINEL LYMPH NODE MAPPING;  Surgeon: [name], MD;  Location: [place];  Service: General;  Laterality: Left;   [place]; 1995   CHOLECYSTECTOMY N/A 02/22/2015   Procedure: LAPAROSCOPIC CHOLECYSTECTOMY WITH INTRAOPERATIVE CHOLANGIOGRAM;  Surgeon: [name], MD;  Location: [place];  Service: General;  Laterality: N/A;   LAPAROSCOPIC CHOLECYSTECTOMY  02/22/2015   PORT-A-CATH REMOVAL N/A 02/03/2020   Procedure: REMOVAL PORT-A-CATH;  Surgeon: [name], MD;  Location: [place];   Service: General;  Laterality: N/A;   PORTACATH PLACEMENT Right 10/08/2018   Procedure: INSERTION PORT-A-CATH WITH ULTRASOUND;  Surgeon: [name], MD;  Location: [place];  Service: General;  Laterality: Right;   TUBAL LIGATION  1995    There were no vitals filed for this visit.   Subjective Assessment - 04/03/21 1200     Subjective Pt states she was scheduled on 03/27/21 after her initial evaluation.  She didn't ask about scheduling any earlier, she thought we were just that busy.  Pt then cancelled the following 2 appt's due to being busy at work.  Pt reports no improvement overall, feels the same as last time.  Pt reports compliance with HEP.  Pt reports improved sx's with brace.  Pt hasn't used any voltaren gel.  Pt denies pain currently stating she has been in her brace.    Pertinent History breast cancer (remission: may 2022 last check ), neuropathy in feet that has subsided/resolved (originally from chemo)    Patient Stated Goals return to activities of daily living with no pain, return to bowling with decreased pain    Currently in Pain? No/denies                               [facility] Adult PT Treatment/Exercise - 04/04/21 0001       Exercises   Exercises Wrist;Hand;Elbow   Reviewed response to  prior Rx, HEP compliance, and pain level.  Reviewed HEP     Elbow Exercises   Other elbow exercises supination AROM 3x10 reps      Hand Exercises   Opposition Limitations x 10 reps.  x10 reps with drag    Rubberbands 3x10 finger extension      Wrist Exercises   Other wrist exercises Pt received R wrist flexion and ext PROM, forearm supination/pronation, and thumb flexion PROM      Manual Therapy   Manual Therapy Soft tissue mobilization    Soft tissue mobilization IASTM with hawk grips to R forearm and wrist including common extensors and lateral distal forearm and lateral wrist/1st MCP to improve pain, reduce myofascial adhesions and restrictions and to promote  proper cross fiber alignment                    PT Education - 04/03/21 2351     Education Details Educated pt concerning POC.  Educated pt on rationale and purpose of IASTM.  Reviewed HEP and performed HEP.    Person(s) Educated Patient    Methods Explanation;Demonstration;Verbal cues    Comprehension Verbalized understanding;Returned demonstration              PT Short Term Goals - 02/27/21 1711       PT SHORT TERM GOAL #1   Title Patient will show 30% improvement in pain with work related activties.    Baseline 6/10 at end of day    Time 3    Period Weeks    Status New    Target Date 03/20/21      PT SHORT TERM GOAL #2   Title Patient will improve hand dynometer strength by 5 lbs.    Baseline right 35, left 45    Time 3    Period Weeks    Status New    Target Date 03/20/21      PT SHORT TERM GOAL #3   Title Patient will increase range of motion of wrist to 85% compared to left hand.    Baseline AROM  64 right, 78 left    Time 3    Period Weeks    Status New    Target Date 03/20/21      PT SHORT TERM GOAL #4   Title Pt will be independent and compliant with HEP for improved pain, ROM, strength, and function.    Time 3    Period Weeks    Status New    Target Date 03/20/21               PT Long Term Goals - 02/27/21 1655       PT LONG TERM GOAL #1   Title Patient will improve right hand supination range of motion in order to perform ADL's such as toileting.    Baseline right 52, left 75    Time 6    Period Weeks    Status New    Target Date 04/10/21      PT LONG TERM GOAL #2   Title Patient will perform work related activities and have less than 2/10 pain at end of work day.    Baseline 6/10 at end of work day    Time 6    Period Weeks    Status New    Target Date 04/10/21      PT LONG TERM GOAL #3   Title Patient will demostrated equal strength in right wrist flexion, radial/ulner deviation,  and hand grip equal to that of left  in order to carry grandson safely.    Baseline right radial deviation 3+/5, right hand supination 4/5    Time 6    Period Weeks    Status New    Target Date 04/10/21                   Plan - 04/03/21 1215     Clinical Impression Statement Pt has been absent from PT due to some confusion with scheduling and then having to cancel last week due to work.  She has been compliant with HEP and reports her sx's are approx the same.  She has improved pain while wearing brace.  Attempted thumb flexion though pt had increased pain and PT had pt stop exercise.  Pt tolerated PROM well.  Pt responded well to IASTM and had an appropriate tissue response.  Pt able to perform thumb flexion with improved mobility and without pain after IASTM.  pt should benefit from cont skilled PT services to address goals and to restore PLOF.    Comorbidities breast cancer (2020 - remission), last check up was May 2022    PT Treatment/Interventions ADLs/Self Care Home Management;Cryotherapy;Electrical Stimulation;Iontophoresis '4mg'$ /ml Dexamethasone;Moist Heat;Ultrasound;Therapeutic exercise;Therapeutic activities;Patient/family education;Manual techniques;Passive range of motion;Dry needling;Splinting;Taping;Joint Manipulations;Neuromuscular re-education    PT Next Visit Plan Cont with wrist and hand ROM, gentle strengthening, and IASTM.    PT Home Exercise Plan HEP CODE: [id number]    Consulted and Agree with Plan of Care Patient             Patient will benefit from skilled therapeutic intervention in order to improve the following deficits and impairments:  Decreased activity tolerance, Decreased range of motion, Decreased endurance, Decreased strength, Impaired perceived functional ability, Impaired UE functional use, Pain, Increased fascial restricitons, Hypomobility  Visit Diagnosis: Pain in right wrist  Stiffness of right wrist, not elsewhere classified  Muscle weakness (generalized)     Problem  List Patient Active Problem List   Diagnosis Date Noted   Hepatic steatosis 01/04/2021   Neuropathy due to chemotherapeutic drug ([place]) 03/16/2019   Port-A-Cath in place 01/11/2019   Chemotherapy induced neutropenia ([place]) 12/22/2018   Malignant neoplasm of upper-outer quadrant of left breast in female, estrogen receptor positive ([place]) 09/28/2018   Acute calculous cholecystitis 02/22/2015   Knee pain 05/04/2012    Selinda Michaels III PT, DPT 04/04/21 9:14 AM   [place] [address] [place], [place], [address] Phone: [phone]   Fax:  [phone]  Name: Natasha Huffman MRN: [id number] Date of Birth: Jun 29, 1961

## 2021-04-05 ENCOUNTER — Ambulatory Visit (HOSPITAL_BASED_OUTPATIENT_CLINIC_OR_DEPARTMENT_OTHER): Payer: 59 | Admitting: Physical Therapy

## 2021-04-05 ENCOUNTER — Encounter (HOSPITAL_BASED_OUTPATIENT_CLINIC_OR_DEPARTMENT_OTHER): Payer: Self-pay | Admitting: Physical Therapy

## 2021-04-05 ENCOUNTER — Other Ambulatory Visit: Payer: Self-pay

## 2021-04-05 DIAGNOSIS — M25631 Stiffness of right wrist, not elsewhere classified: Secondary | ICD-10-CM | POA: Diagnosis not present

## 2021-04-05 DIAGNOSIS — M25531 Pain in right wrist: Secondary | ICD-10-CM | POA: Diagnosis not present

## 2021-04-05 DIAGNOSIS — M6281 Muscle weakness (generalized): Secondary | ICD-10-CM | POA: Diagnosis not present

## 2021-04-05 NOTE — Therapy (Signed)
[place], [place], [address] Phone: [phone]   Fax:  [phone]  Physical Therapy Treatment  Patient Details  Name: Natasha Huffman MRN: [id number] Date of Birth: December 08, 1960 Referring Provider (PT): [name] MD   Encounter Date: 04/05/2021   PT End of Session - 04/05/21 1237     Visit Number 3    Number of Visits 12    Date for PT Re-Evaluation 04/10/21    PT Start Time 1155    PT Stop Time 1233    PT Time Calculation (min) 38 min    Activity Tolerance Patient tolerated treatment well;Other (comment)    Behavior During Therapy WFL for tasks assessed/performed             Past Medical History:  Diagnosis Date   Breast cancer ([place]) 2020   Left Breast Cancer   Cancer ([place]) 09/23/2018   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 ,    Gallstones    GERD (gastroesophageal reflux disease)    resolved    Peripheral neuropathy due to chemotherapy [facility])    Personal history of chemotherapy 2020   Left Breast Cancer   Personal history of radiation therapy 2020   Left Breast Cancer    Past Surgical History:  Procedure Laterality Date   BREAST EXCISIONAL BIOPSY Left 04/08/2019   BREAST LUMPECTOMY Left 04/08/2019   BREAST LUMPECTOMY WITH RADIOACTIVE SEED AND SENTINEL LYMPH NODE BIOPSY Left 04/08/2019   Procedure: LEFT BREAST RADIOACTIVE SEED LUMPECTOMY X2 AND LEFT SENTINEL LYMPH NODE MAPPING;  Surgeon: [name], MD;  Location: [place];  Service: General;  Laterality: Left;   [place]; 1995   CHOLECYSTECTOMY N/A 02/22/2015   Procedure: LAPAROSCOPIC CHOLECYSTECTOMY WITH INTRAOPERATIVE CHOLANGIOGRAM;  Surgeon: [name], MD;  Location: [place];  Service: General;  Laterality: N/A;   LAPAROSCOPIC CHOLECYSTECTOMY  02/22/2015   PORT-A-CATH REMOVAL N/A 02/03/2020   Procedure: REMOVAL PORT-A-CATH;  Surgeon: [name], MD;  Location: [place];   Service: General;  Laterality: N/A;   PORTACATH PLACEMENT Right 10/08/2018   Procedure: INSERTION PORT-A-CATH WITH ULTRASOUND;  Surgeon: [name], MD;  Location: [place];  Service: General;  Laterality: Right;   TUBAL LIGATION  1995    There were no vitals filed for this visit.   Subjective Assessment - 04/05/21 1159     Subjective Pt denies any adverse effects after prior Rx.  Pt states her wrist/hand didn't hurt as much at night as it usually does.  Pt states she hasn't worn her brace as much since prior visit.  Pt states she has been able to perform thumb flexion at home    Pertinent History breast cancer (remission: may 2022 last check ), neuropathy in feet that has subsided/resolved (originally from chemo)    Currently in Pain? Yes    Pain Score 2     Pain Location --   R lateral wrist   Pain Orientation Right                               [facility] Adult PT Treatment/Exercise - 04/05/21 0001       Exercises   Exercises Wrist;Hand;Elbow   Reviewed response to prior Rx, HEP compliance, and pain level.  Reviewed HEP     Elbow Exercises   Other elbow exercises supination/pronation AROM 2x10 reps      Hand Exercises  Opposition Limitations 2x10 reps with drag    Rubberbands 2x10 finger extension      Wrist Exercises   Other wrist exercises Pt received R wrist flexion and ext PROM, forearm supination/pronation, and thumb flexion PROM    Other wrist exercises wrist extension AROM 2x10      Manual Therapy   Manual Therapy Soft tissue mobilization    Soft tissue mobilization IASTM with hawk grips to R forearm and wrist including common extensors and flexors and lateral distal forearm and lateral wrist/1st MCP to improve pain, reduce myofascial adhesions and restrictions and to promote proper cross fiber alignment                    PT Education - 04/05/21 1318     Education Details Educated pt on rationale and purpose of IASTM. Reviewed HEP and  instructed pt to cont with HEP.    Person(s) Educated Patient    Methods Explanation;Demonstration    Comprehension Verbalized understanding;Returned demonstration              PT Short Term Goals - 02/27/21 1711       PT SHORT TERM GOAL #1   Title Patient will show 30% improvement in pain with work related activties.    Baseline 6/10 at end of day    Time 3    Period Weeks    Status New    Target Date 03/20/21      PT SHORT TERM GOAL #2   Title Patient will improve hand dynometer strength by 5 lbs.    Baseline right 35, left 45    Time 3    Period Weeks    Status New    Target Date 03/20/21      PT SHORT TERM GOAL #3   Title Patient will increase range of motion of wrist to 85% compared to left hand.    Baseline AROM  64 right, 78 left    Time 3    Period Weeks    Status New    Target Date 03/20/21      PT SHORT TERM GOAL #4   Title Pt will be independent and compliant with HEP for improved pain, ROM, strength, and function.    Time 3    Period Weeks    Status New    Target Date 03/20/21               PT Long Term Goals - 02/27/21 1655       PT LONG TERM GOAL #1   Title Patient will improve right hand supination range of motion in order to perform ADL's such as toileting.    Baseline right 52, left 75    Time 6    Period Weeks    Status New    Target Date 04/10/21      PT LONG TERM GOAL #2   Title Patient will perform work related activities and have less than 2/10 pain at end of work day.    Baseline 6/10 at end of work day    Time 6    Period Weeks    Status New    Target Date 04/10/21      PT LONG TERM GOAL #3   Title Patient will demostrated equal strength in right wrist flexion, radial/ulner deviation, and hand grip equal to that of left in order to carry grandson safely.    Baseline right radial deviation 3+/5, right hand supination 4/5  Time 6    Period Weeks    Status New    Target Date 04/10/21                    Plan - 04/05/21 1202     Clinical Impression Statement Pt responded well to prior Rx and has not been wearing brace as much since prior Rx.  Pt able to perform thumb flexion at beginning of Rx without pain.  Pt demonstrates much improved AROM with supination.  She performed exercises well.  Pt had an appropriate response to IASTM with multiple peticahiae observces in wrist extensors.  Pt responded well to Rx having no increased pain after Rx.  Pt should cont to benefit from cont skilled PT services to address ongoing goals and to restore PLOF.    Comorbidities breast cancer (2020 - remission), last check up was May 2022    PT Treatment/Interventions ADLs/Self Care Home Management;Cryotherapy;Electrical Stimulation;Iontophoresis '4mg'$ /ml Dexamethasone;Moist Heat;Ultrasound;Therapeutic exercise;Therapeutic activities;Patient/family education;Manual techniques;Passive range of motion;Dry needling;Splinting;Taping;Joint Manipulations;Neuromuscular re-education    PT Next Visit Plan Cont with wrist and hand ROM, gentle strengthening, and IASTM.    PT Home Exercise Plan HEP CODE: [id number]    Consulted and Agree with Plan of Care Patient             Patient will benefit from skilled therapeutic intervention in order to improve the following deficits and impairments:  Decreased activity tolerance, Decreased range of motion, Decreased endurance, Decreased strength, Impaired perceived functional ability, Impaired UE functional use, Pain, Increased fascial restricitons, Hypomobility  Visit Diagnosis: Pain in right wrist  Stiffness of right wrist, not elsewhere classified  Muscle weakness (generalized)     Problem List Patient Active Problem List   Diagnosis Date Noted   Hepatic steatosis 01/04/2021   Neuropathy due to chemotherapeutic drug ([place]) 03/16/2019   Port-A-Cath in place 01/11/2019   Chemotherapy induced neutropenia ([place]) 12/22/2018   Malignant neoplasm of upper-outer quadrant of left  breast in female, estrogen receptor positive ([place]) 09/28/2018   Acute calculous cholecystitis 02/22/2015   Knee pain 05/04/2012    Selinda Michaels III PT, DPT 04/05/21 1:20 PM   [facility] [address] [place], [place], [address] Phone: [phone]   Fax:  [phone]  Name: Natasha Huffman MRN: [id number] Date of Birth: 04/01/1961

## 2021-04-10 ENCOUNTER — Other Ambulatory Visit: Payer: Self-pay

## 2021-04-10 ENCOUNTER — Encounter (HOSPITAL_BASED_OUTPATIENT_CLINIC_OR_DEPARTMENT_OTHER): Payer: Self-pay | Admitting: Physical Therapy

## 2021-04-10 ENCOUNTER — Ambulatory Visit (HOSPITAL_BASED_OUTPATIENT_CLINIC_OR_DEPARTMENT_OTHER): Payer: 59 | Admitting: Physical Therapy

## 2021-04-10 DIAGNOSIS — M6281 Muscle weakness (generalized): Secondary | ICD-10-CM | POA: Diagnosis not present

## 2021-04-10 DIAGNOSIS — M25531 Pain in right wrist: Secondary | ICD-10-CM

## 2021-04-10 DIAGNOSIS — M25631 Stiffness of right wrist, not elsewhere classified: Secondary | ICD-10-CM

## 2021-04-10 NOTE — Therapy (Signed)
[place], [place], [address] Phone: [phone]   Fax:  [phone]  Physical Therapy Treatment  Patient Details  Name: Natasha Huffman MRN: [id number] Date of Birth: Jun 10, 1961 Referring Provider (PT): [name] MD   Encounter Date: 04/10/2021   PT End of Session - 04/10/21 1248     Visit Number 4    Number of Visits 12    Date for PT Re-Evaluation 04/10/21    Authorization Type [employer]    PT Start Time 1202    PT Stop Time [id number]    PT Time Calculation (min) 43 min    Activity Tolerance Patient tolerated treatment well    Behavior During Therapy [facility] for tasks assessed/performed             Past Medical History:  Diagnosis Date   Breast cancer ([place]) 2020   Left Breast Cancer   Cancer ([place]) 09/23/2018   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 ,    Gallstones    GERD (gastroesophageal reflux disease)    resolved    Peripheral neuropathy due to chemotherapy [facility])    Personal history of chemotherapy 2020   Left Breast Cancer   Personal history of radiation therapy 2020   Left Breast Cancer    Past Surgical History:  Procedure Laterality Date   BREAST EXCISIONAL BIOPSY Left 04/08/2019   BREAST LUMPECTOMY Left 04/08/2019   BREAST LUMPECTOMY WITH RADIOACTIVE SEED AND SENTINEL LYMPH NODE BIOPSY Left 04/08/2019   Procedure: LEFT BREAST RADIOACTIVE SEED LUMPECTOMY X2 AND LEFT SENTINEL LYMPH NODE [employer];  Surgeon: [name], MD;  Location: [place];  Service: General;  Laterality: Left;   [place]; 1995   CHOLECYSTECTOMY N/A 02/22/2015   Procedure: LAPAROSCOPIC CHOLECYSTECTOMY WITH INTRAOPERATIVE CHOLANGIOGRAM;  Surgeon: [name], MD;  Location: [place];  Service: General;  Laterality: N/A;   LAPAROSCOPIC CHOLECYSTECTOMY  02/22/2015   PORT-A-CATH REMOVAL N/A 02/03/2020   Procedure: REMOVAL PORT-A-CATH;  Surgeon: [name], MD;  Location: [place];  Service: General;  Laterality: N/A;   PORTACATH PLACEMENT Right 10/08/2018   Procedure: INSERTION PORT-A-CATH WITH ULTRASOUND;  Surgeon: [name], MD;  Location: [place];  Service: General;  Laterality: Right;   TUBAL LIGATION  1995    There were no vitals filed for this visit.   Subjective Assessment - 04/10/21 1205     Subjective Pt denies any adverse effects after prior Rx.  Pt is not wearing her brace as much but does wear it when cutting the grass.  pt reports improved pain overall. Pt is able to do more things without thinking right now.    Pertinent History breast cancer (remission: may 2022 last check ), neuropathy in feet that has subsided/resolved (originally from chemo)    Currently in Pain? No/denies                [facility] PT Assessment - 04/10/21 0001       AROM   Right Forearm Supination 64 Degrees   with tenderness but no pain                          [facility] Adult PT Treatment/Exercise - 04/10/21 0001       Exercises   Exercises Wrist;Hand;Elbow   Reviewed response to prior Rx, HEP compliance, and pain level.  Reviewed HEP     Elbow Exercises   Other  elbow exercises supination/pronation AROM 2x10 reps and with 1# 2x10 reps      Hand Exercises   Opposition Limitations 2x10 reps with drag    Other Hand Exercises Putty grip approx 12 reps and then 20 reps      Wrist Exercises   Other wrist exercises Pt received R wrist flexion and ext PROM, forearm supination/pronation, and thumb flexion PROM    Other wrist exercises wrist extension AROM 2x10 with 1#.  wrist flexion in neutral position with gravity minimized 3x10 reps                    PT Education - 04/10/21 2209     Education Details Reviewed HEP and instructed pt to cont with HEP.  Updated HEP with putty grip and gave pt a handout.  Educated pt in correct form and appropriate frequency.    Person(s) Educated Patient    Methods  Explanation;Handout;Verbal cues;Demonstration    Comprehension Verbalized understanding;Returned demonstration              PT Short Term Goals - 02/27/21 1711       PT SHORT TERM GOAL #1   Title Patient will show 30% improvement in pain with work related activties.    Baseline 6/10 at end of day    Time 3    Period Weeks    Status New    Target Date 03/20/21      PT SHORT TERM GOAL #2   Title Patient will improve hand dynometer strength by 5 lbs.    Baseline right 35, left 45    Time 3    Period Weeks    Status New    Target Date 03/20/21      PT SHORT TERM GOAL #3   Title Patient will increase range of motion of wrist to 85% compared to left hand.    Baseline AROM  64 right, 78 left    Time 3    Period Weeks    Status New    Target Date 03/20/21      PT SHORT TERM GOAL #4   Title Pt will be independent and compliant with HEP for improved pain, ROM, strength, and function.    Time 3    Period Weeks    Status New    Target Date 03/20/21               PT Long Term Goals - 02/27/21 1655       PT LONG TERM GOAL #1   Title Patient will improve right hand supination range of motion in order to perform ADL's such as toileting.    Baseline right 52, left 75    Time 6    Period Weeks    Status New    Target Date 04/10/21      PT LONG TERM GOAL #2   Title Patient will perform work related activities and have less than 2/10 pain at end of work day.    Baseline 6/10 at end of work day    Time 6    Period Weeks    Status New    Target Date 04/10/21      PT LONG TERM GOAL #3   Title Patient will demostrated equal strength in right wrist flexion, radial/ulner deviation, and hand grip equal to that of left in order to carry grandson safely.    Baseline right radial deviation 3+/5, right hand supination 4/5    Time 6  Period Weeks    Status New    Target Date 04/10/21                   Plan - 04/10/21 1221     Clinical Impression Statement  Pt is progressing well with pain, sx's, and function.  She is not wearing her brace as much t/o the day.  Pt demonstrates improved tolerance with exercises as evidenced by performance of exercises.  PT added light resistance to select exercises.  Pt is able to perform thumb flexion well without increased pain.  Pt demonstrates improved supination AROM from 52 deg initially to 64 deg currently without pain just tenderness.  Pt has weakness in wrist flexors and is unable to perform wrist flexion with 1# and has difficulty performing AROM against gravity.  Pt also had pain with wrist flexion AROM against gravity though was able to perform with gravity minimized.  Pt had an appropriate response to IASTM.  Pt responded well to Rx having no pain after Rx.    Comorbidities breast cancer (2020 - remission), last check up was May 2022    PT Treatment/Interventions ADLs/Self Care Home Management;Cryotherapy;Electrical Stimulation;Iontophoresis '4mg'$ /ml Dexamethasone;Moist Heat;Ultrasound;Therapeutic exercise;Therapeutic activities;Patient/family education;Manual techniques;Passive range of motion;Dry needling;Splinting;Taping;Joint Manipulations;Neuromuscular re-education    PT Next Visit Plan Cont with wrist and hand ROM, gentle strengthening, and IASTM.    PT Home Exercise Plan HEP CODE: [id number]    Consulted and Agree with Plan of Care Patient             Patient will benefit from skilled therapeutic intervention in order to improve the following deficits and impairments:  Decreased activity tolerance, Decreased range of motion, Decreased endurance, Decreased strength, Impaired perceived functional ability, Impaired UE functional use, Pain, Increased fascial restricitons, Hypomobility  Visit Diagnosis: Pain in right wrist  Stiffness of right wrist, not elsewhere classified  Muscle weakness (generalized)     Problem List Patient Active Problem List   Diagnosis Date Noted   Hepatic steatosis  01/04/2021   Neuropathy due to chemotherapeutic drug ([place]) 03/16/2019   Port-A-Cath in place 01/11/2019   Chemotherapy induced neutropenia ([place]) 12/22/2018   Malignant neoplasm of upper-outer quadrant of left breast in female, estrogen receptor positive ([place]) 09/28/2018   Acute calculous cholecystitis 02/22/2015   Knee pain 05/04/2012    Selinda Michaels III PT, DPT 04/10/21 10:20 PM   [place], [place], [address] Phone: [phone]   Fax:  [phone]  Name: Natasha Huffman MRN: [id number] Date of Birth: [birth year]

## 2021-04-17 ENCOUNTER — Encounter (HOSPITAL_BASED_OUTPATIENT_CLINIC_OR_DEPARTMENT_OTHER): Payer: Self-pay | Admitting: Physical Therapy

## 2021-04-17 ENCOUNTER — Ambulatory Visit (HOSPITAL_BASED_OUTPATIENT_CLINIC_OR_DEPARTMENT_OTHER): Payer: 59 | Attending: Family Medicine | Admitting: Physical Therapy

## 2021-04-17 ENCOUNTER — Other Ambulatory Visit: Payer: Self-pay

## 2021-04-17 DIAGNOSIS — M6281 Muscle weakness (generalized): Secondary | ICD-10-CM | POA: Insufficient documentation

## 2021-04-17 DIAGNOSIS — M25531 Pain in right wrist: Secondary | ICD-10-CM | POA: Insufficient documentation

## 2021-04-17 DIAGNOSIS — M25631 Stiffness of right wrist, not elsewhere classified: Secondary | ICD-10-CM | POA: Diagnosis not present

## 2021-04-17 NOTE — Therapy (Signed)
[place], [place], [address] Phone: [phone]   Fax:  [phone]  Physical Therapy Treatment  Patient Details  Name: Natasha Huffman MRN: [id number] Date of Birth: 01-Aug-1961 Referring Provider (PT): [name] MD   Encounter Date: 04/17/2021   PT End of Session - 04/17/21 1204     Visit Number 5    Number of Visits 13    Date for PT Re-Evaluation 05/15/21    Authorization Type [employer]    PT Start Time 9675    PT Stop Time 9163    PT Time Calculation (min) 40 min    Activity Tolerance Patient tolerated treatment well    Behavior During Therapy [facility] for tasks assessed/performed             Past Medical History:  Diagnosis Date   Breast cancer ([place]) 2020   Left Breast Cancer   Cancer ([place]) 09/23/2018   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 ,    Gallstones    GERD (gastroesophageal reflux disease)    resolved    Peripheral neuropathy due to chemotherapy [facility])    Personal history of chemotherapy 2020   Left Breast Cancer   Personal history of radiation therapy 2020   Left Breast Cancer    Past Surgical History:  Procedure Laterality Date   BREAST EXCISIONAL BIOPSY Left 04/08/2019   BREAST LUMPECTOMY Left 04/08/2019   BREAST LUMPECTOMY WITH RADIOACTIVE SEED AND SENTINEL LYMPH NODE BIOPSY Left 04/08/2019   Procedure: LEFT BREAST RADIOACTIVE SEED LUMPECTOMY X2 AND LEFT SENTINEL LYMPH NODE [place];  Surgeon: [name], MD;  Location: [place];  Service: General;  Laterality: Left;   [place]; 1995   CHOLECYSTECTOMY N/A 02/22/2015   Procedure: LAPAROSCOPIC CHOLECYSTECTOMY WITH INTRAOPERATIVE CHOLANGIOGRAM;  Surgeon: [name], MD;  Location: [place];  Service: General;  Laterality: N/A;   LAPAROSCOPIC CHOLECYSTECTOMY  02/22/2015   PORT-A-CATH REMOVAL N/A 02/03/2020   Procedure: REMOVAL PORT-A-CATH;  Surgeon: [name], MD;  Location: [place];  Service: General;  Laterality: N/A;   PORTACATH PLACEMENT Right 10/08/2018   Procedure: INSERTION PORT-A-CATH WITH ULTRASOUND;  Surgeon: [name], MD;  Location: [place];  Service: General;  Laterality: Right;   TUBAL LIGATION  1995    There were no vitals filed for this visit.   Subjective Assessment - 04/17/21 1157     Subjective Pt denies any adverse effects after prior Rx.   Pt states she has been working her hands with activities when she was gone including lifting long pans and cleaning.  Pt is not wearing her brace as much but does wear it when cutting the grass.  pt reports improved pain overall. Pt is able to do more things without thinking right now.  "If I turn it a certain way, the pain is still there".  Pt c/o's of pain with rotating forearm.  Pt reports improved pain overall and she can go longer without brace.  Able to hold grandson for longer periods of time, but uses brace.  Pt able to don tommy copper stockings.  Pt has improved pain with typing and reports 50% improvement with work related activities.    Pertinent History breast cancer (remission: may 2022 last check ), neuropathy in feet that has subsided/resolved (originally from chemo)    Currently in Pain? Yes    Pain Score 2     Pain Location --   R lateral  wrist               [facility] PT Assessment - 04/17/21 0001       Observation/Other Assessments   Other Surveys  Upper Extremity Functional Index    Upper Extremity Functional Index  48/80   1-3; 2,3,4-1; 5-3; 6,7-2; 8-3; 9-2; 10,11,12-3; 13-2; 14,15,16,17- 3; 18-2; 19-3; 20-2.     AROM   Right Forearm Supination 65 Degrees    Right Wrist Extension 77 Degrees    Right Wrist Flexion 64 Degrees   little tender   Right Wrist Radial Deviation 19 Degrees   without pain   Right Wrist Ulnar Deviation 40 Degrees   without pain     Strength   Overall Strength Comments Grip:  R:  37,38 lbs  L:  44,5 lbs.  Thumb pinch:  R: 4 lbs L:  8 lbs     Right Wrist Flexion 4-/5    Right Wrist Radial Deviation 3+/5   with pain   Right Wrist Ulnar Deviation 4/5      Special Tests   Other special tests Right Finklestein Test and Eichoff Test:  Positive.  Negative with left hand                           [facility] Adult PT Treatment/Exercise - 04/17/21 0001       Exercises   Exercises Wrist;Hand;Elbow   Reviewed response to prior Rx, HEP compliance, current function, reported functional progress/deficits, and pain level.  Assessed ROM and strength.  Pt completed UEFI.     Elbow Exercises   Other elbow exercises supination/pronation with 1# 2x10 reps      Hand Exercises   Opposition Limitations 2x10 reps with drag      Wrist Exercises   Other wrist exercises wrist extension AROM 2x10 with 1#.  wrist flexion in neutral position with gravity minimized 3x10 reps                    PT Education - 04/17/21 1505     Education Details Instructed pt to cont with HEP.  Educated pt in objective findings and progress compared to her initial eval.  Educated pt in goal progress and POC.    Person(s) Educated Patient    Methods Explanation    Comprehension Verbalized understanding;Returned demonstration              PT Short Term Goals - 04/17/21 1210       PT SHORT TERM GOAL #1   Title Patient will show 30% improvement in pain with work related activties.    Baseline reported 50% improvement (04/17/21)    Time 3    Period Weeks    Status Achieved    Target Date 03/20/21      PT SHORT TERM GOAL #2   Title Patient will improve hand dynometer strength by 5 lbs.    Time 3    Status Not Met    Target Date 03/20/21      PT SHORT TERM GOAL #3   Title Patient will increase range of motion of wrist to 85% compared to left hand.    Baseline met except flexion    Time 3    Period Weeks    Status Partially Met    Target Date 03/20/21      PT SHORT TERM GOAL #4   Title Pt will be independent and compliant  with HEP for improved  pain, ROM, strength, and function.    Time 3    Period Weeks    Status Achieved    Target Date 03/20/21               PT Long Term Goals - 04/17/21 1518       PT LONG TERM GOAL #1   Title Patient will improve right hand supination range of motion in order to perform ADL's such as toileting.    Time 4    Status Partially Met    Target Date 05/15/21      PT LONG TERM GOAL #2   Title Patient will perform work related activities and have less than 2/10 pain at end of work day.    Time 4    Period Weeks    Status On-going    Target Date 05/15/21      PT LONG TERM GOAL #3   Title Patient will demostrated equal strength in right wrist flexion, radial/ulner deviation, and hand grip equal to that of left in order to carry grandson safely.    Time 4    Period Weeks    Status Not Met    Target Date 05/15/21                   Plan - 04/17/21 1507     Clinical Impression Statement Pt has only received 5 PT visits counting today and is making good progress in all areas.  Pt is improving with tolerance to activity and exercises and functional usage.  She demonstrates improved ROM t/o R elbow and wrist.  Pt is able to perform increased hand/forearm/wrist ROM with improved tolerance and reduced pain.  Pt demonstrates improved functional progress as evidenced by subjective reports including typing and not using the brace as much.  Pt reports 50% improvement in work related activities.  She does continue to have pain and has a positive Finkelstein's Test and Eichoff's test. Pt has muscle weakness in R wrist and hand but is able to perform increased exercises with reduced pain.  Pt is progressing well toward STG's and LTG's.  See above in note for goal progress.  She should cont to benefit from cont skilled PT services to address ongoing goals and to restore PLOF.    Comorbidities breast cancer (2020 - remission), last check up was May 2022    PT Frequency 2x /  week    PT Duration 4 weeks    PT Treatment/Interventions ADLs/Self Care Home Management;Cryotherapy;Electrical Stimulation;Iontophoresis 34m/ml Dexamethasone;Moist Heat;Ultrasound;Therapeutic exercise;Therapeutic activities;Patient/family education;Manual techniques;Passive range of motion;Dry needling;Splinting;Taping;Joint Manipulations;Neuromuscular re-education    PT Next Visit Plan Cont with wrist and hand ROM, gentle strengthening, and IASTM.  Cont with PT 2x/wk x 4 weeks.    PT Home Exercise Plan HEP CODE: [id number]    Consulted and Agree with Plan of Care Patient             Patient will benefit from skilled therapeutic intervention in order to improve the following deficits and impairments:  Decreased activity tolerance, Decreased range of motion, Decreased endurance, Decreased strength, Impaired perceived functional ability, Impaired UE functional use, Pain, Increased fascial restricitons, Hypomobility  Visit Diagnosis: Pain in right wrist  Stiffness of right wrist, not elsewhere classified  Muscle weakness (generalized)     Problem List Patient Active Problem List   Diagnosis Date Noted   Hepatic steatosis 01/04/2021   Neurop[place] due to chemotherapeutic drug ([place] 03/16/2019   Port-A-Cath in place 01/11/2019   Chemotherapy  induced neutropenia ([place]) 12/22/2018   Malignant neoplasm of upper-outer quadrant of left breast in female, estrogen receptor positive ([place]) 09/28/2018   Acute calculous cholecystitis 02/22/2015   Knee pain 05/04/2012   Selinda Michaels III PT, DPT 04/17/21 3:25 PM   [facility] [place], [place], [address] Phone: [phone]   Fax:  [phone]  Name: Natasha Huffman MRN: [id number] Date of Birth: 09/15/1960

## 2021-04-19 ENCOUNTER — Other Ambulatory Visit: Payer: Self-pay

## 2021-04-19 ENCOUNTER — Ambulatory Visit (HOSPITAL_BASED_OUTPATIENT_CLINIC_OR_DEPARTMENT_OTHER): Payer: 59 | Admitting: Physical Therapy

## 2021-04-19 ENCOUNTER — Encounter (HOSPITAL_BASED_OUTPATIENT_CLINIC_OR_DEPARTMENT_OTHER): Payer: Self-pay | Admitting: Physical Therapy

## 2021-04-19 DIAGNOSIS — M6281 Muscle weakness (generalized): Secondary | ICD-10-CM | POA: Diagnosis not present

## 2021-04-19 DIAGNOSIS — M25631 Stiffness of right wrist, not elsewhere classified: Secondary | ICD-10-CM | POA: Diagnosis not present

## 2021-04-19 DIAGNOSIS — M25531 Pain in right wrist: Secondary | ICD-10-CM

## 2021-04-19 NOTE — Therapy (Signed)
[place], [place], [address] Phone: [phone]   Fax:  [phone]  Physical Therapy Treatment  Patient Details  Name: Natasha Huffman MRN: [id number] Date of Birth: 12-11-1960 Referring Provider (PT): [name] MD   Encounter Date: 04/19/2021   PT End of Session - 04/19/21 1209     Visit Number 6    Number of Visits 13    Date for PT Re-Evaluation 05/15/21    Authorization Type [employer]    PT Start Time 3532    PT Stop Time 9924    PT Time Calculation (min) 43 min    Activity Tolerance Patient tolerated treatment well    Behavior During Therapy [facility] for tasks assessed/performed             Past Medical History:  Diagnosis Date   Breast cancer ([place]) 2020   Left Breast Cancer   Cancer ([place]) 09/23/2018   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 ,    Gallstones    GERD (gastroesophageal reflux disease)    resolved    Peripheral neuropathy due to chemotherapy [facility])    Personal history of chemotherapy 2020   Left Breast Cancer   Personal history of radiation therapy 2020   Left Breast Cancer    Past Surgical History:  Procedure Laterality Date   BREAST EXCISIONAL BIOPSY Left 04/08/2019   BREAST LUMPECTOMY Left 04/08/2019   BREAST LUMPECTOMY WITH RADIOACTIVE SEED AND SENTINEL LYMPH NODE BIOPSY Left 04/08/2019   Procedure: LEFT BREAST RADIOACTIVE SEED LUMPECTOMY X2 AND LEFT SENTINEL LYMPH NODE [place];  Surgeon: [name], MD;  Location: [place];  Service: General;  Laterality: Left;   [place]; 1995   CHOLECYSTECTOMY N/A 02/22/2015   Procedure: LAPAROSCOPIC CHOLECYSTECTOMY WITH INTRAOPERATIVE CHOLANGIOGRAM;  Surgeon: [name], MD;  Location: [place];  Service: General;  Laterality: N/A;   LAPAROSCOPIC CHOLECYSTECTOMY  02/22/2015   PORT-A-CATH REMOVAL N/A 02/03/2020   Procedure: REMOVAL PORT-A-CATH;  Surgeon: [name], MD;  Location: [place];  Service: General;  Laterality: N/A;   PORTACATH PLACEMENT Right 10/08/2018   Procedure: INSERTION PORT-A-CATH WITH ULTRASOUND;  Surgeon: [name], MD;  Location: [place];  Service: General;  Laterality: Right;   TUBAL LIGATION  1995    There were no vitals filed for this visit.   Subjective Assessment - 04/19/21 1150     Subjective Pt denies any adverse effects after prior Rx. Pt denies pain currently.  Pt states she has been working her hands with activities when she was gone including lifting long pans and cleaning.  Pt is not wearing her brace as much.  pt reports improved pain overall. Pt is able to do more things without thinking right now.  Pt typically is able to open a door without significant pain though had signficant pain when turning a door knob this AM.  Pt c/o's of pain with rotating forearm.  Pt reports improved pain overall and she can go longer without brace.  Able to hold grandson for longer periods of time, but uses brace.  Pt able to don tommy copper stockings.  Pt has improved pain with typing and reports 50% improvement with work related activities.    Pertinent History breast cancer (remission: may 2022 last check ), neuropathy in feet that has subsided/resolved (originally from chemo)    Currently in Pain? No/denies  [place] Adult PT Treatment/Exercise - 04/19/21 0001       Exercises   Exercises Wrist;Hand;Elbow   Reviewed response to prior Rx, HEP compliance, current function, and pain level..     Elbow Exercises   Other elbow exercises supination/pronation with 2# 2x10 reps      Hand Exercises   Opposition Limitations 2x10 reps with drag    Other Hand Exercises pinch and pull with yellow putty 2x10 reps      Wrist Exercises   Other wrist exercises Pt received R wrist flexion and ext PROM, forearm supination/pronation, and thumb flexion PROM    Other wrist exercises wrist extension AROM  1x10 with 1# and 2x10 with 2#.  attempted wrist flexion in neutral position with gravity minimized and against gravity though stopped due to pain. Radial deviation AROM x 10 reps and with eccentric focus x 10 reps                     PT Education - 04/19/21 1207     Education Details Instructed pt to cont with HEP.  Educatd pt in [place].  Instructed pt in correct form with exercises.    Person(s) Educated Patient    Methods Explanation;Verbal cues;Demonstration    Comprehension Verbalized understanding;Returned demonstration              PT Short Term Goals - 04/17/21 1210       PT SHORT TERM GOAL #1   Title Patient will show 30% improvement in pain with work related activties.    Baseline reported 50% improvement (04/17/21)    Time 3    Period Weeks    Status Achieved    Target Date 03/20/21      PT SHORT TERM GOAL #2   Title Patient will improve hand dynometer strength by 5 lbs.    Time 3    Status Not Met    Target Date 03/20/21      PT SHORT TERM GOAL #3   Title Patient will increase range of motion of wrist to 85% compared to left hand.    Baseline met except flexion    Time 3    Period Weeks    Status Partially Met    Target Date 03/20/21      PT SHORT TERM GOAL #4   Title Pt will be independent and compliant with HEP for improved pain, ROM, strength, and function.    Time 3    Period Weeks    Status Achieved    Target Date 03/20/21               PT Long Term Goals - 04/17/21 1518       PT LONG TERM GOAL #1   Title Patient will improve right hand supination range of motion in order to perform ADL's such as toileting.    Time 4    Status Partially Met    Target Date 05/15/21      PT LONG TERM GOAL #2   Title Patient will perform work related activities and have less than 2/10 pain at end of work day.    Time 4    Period Weeks    Status On-going    Target Date 05/15/21      PT LONG TERM GOAL #3   Title Patient will demostrated equal  strength in right wrist flexion, radial/ulner deviation, and hand grip equal to that of left in order to carry grandson safely.  Time 4    Period Weeks    Status Not Met    Target Date 05/15/21                   Plan - 04/19/21 1211     Clinical Impression Statement Pt is progressing well with pain, sx's, and function.  She is able to go without brace for longer periods.  PT increased resistance with select exercises.  Attempted wrist flexion in neutral position with gravity minimized and against gravity though stopped due to pain.  pt able to perform resisted wrist extension though unable to perform unweighted wrist flexion due to pain.  Pt performed exercises well with cuing for correct form and positioning.  pt had an appropriate response to IASTM and responds well to IASTM.  She responded well to Rx reporting no pain after Rx. Pt should cont to benefit from cont skilled PT services to address ongoing goals and to restore PLOF.    PT Treatment/Interventions ADLs/Self Care Home Management;Cryotherapy;Electrical Stimulation;Iontophoresis 70m/ml Dexamethasone;Moist Heat;Ultrasound;Therapeutic exercise;Therapeutic activities;Patient/family education;Manual techniques;Passive range of motion;Dry needling;Splinting;Taping;Joint Manipulations;Neuromuscular re-education    PT Next Visit Plan Cont with wrist and hand ROM, gentle strengthening, and IASTM.  Cont with PT 2x/wk x 4 weeks.    PT Home Exercise Plan HEP CODE: [id number]    Consulted and Agree with Plan of Care Patient             Patient will benefit from skilled therapeutic intervention in order to improve the following deficits and impairments:  Decreased activity tolerance, Decreased range of motion, Decreased endurance, Decreased strength, Impaired perceived functional ability, Impaired UE functional use, Pain, Increased fascial restricitons, Hypomobility  Visit Diagnosis: Pain in right wrist  Stiffness of right wrist, not  elsewhere classified  Muscle weakness (generalized)     Problem List Patient Active Problem List   Diagnosis Date Noted   Hepatic steatosis 01/04/2021   Neuropathy due to chemotherapeutic drug ([place] 03/16/2019   Port-A-Cath in place 01/11/2019   [place]rop[place] 12/22/2018   Malignant neoplasm of upper-outer quadrant of left breast in female, estrogen receptor positive ([place] 09/28/2018   Acute calculous cholecystitis 02/22/2015   Knee pain 05/04/2012    RSelinda MichaelsIII PT, DPT 04/19/21 4:31 PM   [place] [address]Phone: [phone]  Fax:  [phone] Name: Natasha PENLANDMRN: [id number] of Birth: 907-15-1962

## 2021-04-24 ENCOUNTER — Ambulatory Visit (HOSPITAL_BASED_OUTPATIENT_CLINIC_OR_DEPARTMENT_OTHER): Payer: 59 | Admitting: Physical Therapy

## 2021-04-24 ENCOUNTER — Other Ambulatory Visit: Payer: Self-pay

## 2021-04-24 ENCOUNTER — Encounter (HOSPITAL_BASED_OUTPATIENT_CLINIC_OR_DEPARTMENT_OTHER): Payer: Self-pay | Admitting: Physical Therapy

## 2021-04-24 ENCOUNTER — Other Ambulatory Visit (HOSPITAL_COMMUNITY): Payer: Self-pay

## 2021-04-24 DIAGNOSIS — M25531 Pain in right wrist: Secondary | ICD-10-CM

## 2021-04-24 DIAGNOSIS — M25631 Stiffness of right wrist, not elsewhere classified: Secondary | ICD-10-CM | POA: Diagnosis not present

## 2021-04-24 DIAGNOSIS — M6281 Muscle weakness (generalized): Secondary | ICD-10-CM

## 2021-04-24 MED ORDER — DIAZEPAM 5 MG PO TABS
5.0000 mg | ORAL_TABLET | Freq: Every day | ORAL | 0 refills | Status: DC
  Filled 2021-04-24: qty 30, 15d supply, fill #0

## 2021-04-24 NOTE — Therapy (Signed)
[facility] [address] [place], [place], [address] Phone: [phone]   Fax:  [phone]  Physical Therapy Treatment  Patient Details  Name: ZONA PEDRO MRN: [id number] Date of Birth: 07/23/1961 Referring Provider (PT): [name] MD   Encounter Date: 04/24/2021   PT End of Session - 04/24/21 1500     Visit Number 7    Number of Visits 13    Date for PT Re-Evaluation 05/15/21    Authorization Type [employer]    PT Start Time 9741    PT Stop Time 1216    PT Time Calculation (min) 30 min    Activity Tolerance Patient tolerated treatment well    Behavior During Therapy [facility] for tasks assessed/performed             Past Medical History:  Diagnosis Date   Breast cancer ([place]) 2020   Left Breast Cancer   Cancer ([place]) 09/23/2018   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 ,    Gallstones    GERD (gastroesophageal reflux disease)    resolved    Peripheral neuropathy due to chemotherapy [facility])    Personal history of chemotherapy 2020   Left Breast Cancer   Personal history of radiation therapy 2020   Left Breast Cancer    Past Surgical History:  Procedure Laterality Date   BREAST EXCISIONAL BIOPSY Left 04/08/2019   BREAST LUMPECTOMY Left 04/08/2019   BREAST LUMPECTOMY WITH RADIOACTIVE SEED AND SENTINEL LYMPH NODE BIOPSY Left 04/08/2019   Procedure: LEFT BREAST RADIOACTIVE SEED LUMPECTOMY X2 AND LEFT SENTINEL LYMPH NODE [place];  Surgeon: [name], MD;  Location: [place];  Service: General;  Laterality: Left;   [place]; 1995   CHOLECYSTECTOMY N/A 02/22/2015   Procedure: LAPAROSCOPIC CHOLECYSTECTOMY WITH INTRAOPERATIVE CHOLANGIOGRAM;  Surgeon: [name], MD;  Location: [place];  Service: General;  Laterality: N/A;   LAPAROSCOPIC CHOLECYSTECTOMY  02/22/2015   PORT-A-CATH REMOVAL N/A 02/03/2020   Procedure: REMOVAL PORT-A-CATH;  Surgeon: [name], MD;  Location: [place];  Service: General;  Laterality: N/A;   PORTACATH PLACEMENT Right 10/08/2018   Procedure: INSERTION PORT-A-CATH WITH ULTRASOUND;  Surgeon: [name], MD;  Location: [place];  Service: General;  Laterality: Right;   TUBAL LIGATION  1995    There were no vitals filed for this visit.   Subjective Assessment - 04/24/21 1204     Subjective Pt denies any adverse effects after prior Rx. Pt denies pain currently.  Pt is not wearing her brace as much.  pt reports improved pain overall. Pt is able to do more things without thinking right now.  Pt able to cook and washing dishes with less pain.  Pt c/o's of pain with rotating forearm and certain positions/quick movements though overall better.  She can have pain with trying to rub or scratch her upper back.  Pt reports improved pain overall and she can go longer without brace.  Able to hold grandson for longer periods of time, but uses brace.  Pt states she is using her R UE a little more with bathing with little pain.  pt reports compliance with HEP including using the putty.    Pertinent History breast cancer (remission: may 2022 last check ), neuropathy in feet that has subsided/resolved (originally from chemo)    Currently in Pain? No/denies  [place] Adult PT Treatment/Exercise - 04/24/21 0001       Exercises   Exercises Wrist;Hand;Elbow   Reviewed response to prior Rx, HEP compliance, current function, and pain level..     Elbow Exercises   Other elbow exercises supination/pronation with 2# 3x10 reps      Hand Exercises   Opposition Limitations 2x10 reps with drag      Wrist Exercises   Other wrist exercises Pt received R wrist flexion and ext PROM,    Other wrist exercises wrist extension AROM 3x10 with 2#.  attempted wrist flexion in neutral position with gravity minimized with hand supported though stopped due to pain. Radial deviation AROM 3 x 10 reps                      PT Education - 04/24/21 1623     Education Details instructed pt to cont with HEP    Person(s) Educated Patient    Methods Explanation    Comprehension Verbalized understanding              PT Short Term Goals - 04/17/21 1210       PT SHORT TERM GOAL #1   Title Patient will show 30% improvement in pain with work related activties.    Baseline reported 50% improvement (04/17/21)    Time 3    Period Weeks    Status Achieved    Target Date 03/20/21      PT SHORT TERM GOAL #2   Title Patient will improve hand dynometer strength by 5 lbs.    Time 3    Status Not Met    Target Date 03/20/21      PT SHORT TERM GOAL #3   Title Patient will increase range of motion of wrist to 85% compared to left hand.    Baseline met except flexion    Time 3    Period Weeks    Status Partially Met    Target Date 03/20/21      PT SHORT TERM GOAL #4   Title Pt will be independent and compliant with HEP for improved pain, ROM, strength, and function.    Time 3    Period Weeks    Status Achieved    Target Date 03/20/21               PT Long Term Goals - 04/17/21 1518       PT LONG TERM GOAL #1   Title Patient will improve right hand supination range of motion in order to perform ADL's such as toileting.    Time 4    Status Partially Met    Target Date 05/15/21      PT LONG TERM GOAL #2   Title Patient will perform work related activities and have less than 2/10 pain at end of work day.    Time 4    Period Weeks    Status On-going    Target Date 05/15/21      PT LONG TERM GOAL #3   Title Patient will demostrated equal strength in right wrist flexion, radial/ulner deviation, and hand grip equal to that of left in order to carry grandson safely.    Time 4    Period Weeks    Status Not Met    Target Date 05/15/21                   Plan - 04/24/21 1458     Clinical  Impression Statement Rx time was limited due to pt arriving at wrong time.  Pt  is progressing well with pain, sx's, and function. She is able to go without brace for longer periods.  Pt is able to cook and wash dishes with improved pain.  Attempted wrist flexion in neutral position with gravity minimized and hand supported though though stopped due to pain.  Pt performed exercises well with cuing for correct form and positioning.  Pt had an appropriate response to IASTM and responds well to IASTM.  She responded well to Rx having no c/o's after Rx. Pt should cont to benefit from cont skilled PT services to address ongoing goals and to restore PLOF    Comorbidities breast cancer (2020 - remission), last check up was May 2022    PT Treatment/Interventions ADLs/Self Care Home Management;Cryotherapy;Electrical Stimulation;Iontophoresis 74m/ml Dexamethasone;Moist Heat;Ultrasound;Therapeutic exercise;Therapeutic activities;Patient/family education;Manual techniques;Passive range of motion;Dry needling;Splinting;Taping;Joint Manipulations;Neuromuscular re-education    PT Next Visit Plan Cont with wrist and hand ROM, gentle strengthening, and IASTM.   Add T band to eccentric radial deviation next week    PT Home Exercise Plan HEP CODE: [email or web address]    Consulted and Agree with Plan of Care Patient             Patient will benefit from skilled therapeutic intervention in order to improve the following deficits and impairments:  Decreased activity tolerance, Decreased range of motion, Decreased endurance, Decreased strength, Impaired perceived functional ability, Impaired UE functional use, Pain, Increased fascial restricitons, Hypomobility  Visit Diagnosis: Pain in right wrist  Stiffness of right wrist, not elsewhere classified  Muscle weakness (generalized)     Problem List Patient Active Problem List   Diagnosis Date Noted   Hepatic steatosis 01/04/2021   [place]urop[place] due [place]otherapeutic drug [place] 03/16/2019   Port-A-Cath in place 01/11/2019   Chemotherapy induced  neutropenia ([place] 12/22/2018   Malignant neoplasm of upper-outer quadrant of left breast in female, estrogen receptor positive ([place] 09/28/2018   Acute calculous cholecystitis 02/22/2015   Knee pain 05/04/2012    RSelinda MichaelsIII PT, DPT 04/24/21 4:27 PM   [facility] [address] [address]Phone: [phone]  Fax:  [phone] Name: SJULETTA BERHEMRN: [id number] of Birth: 9Apr 26, 1962

## 2021-04-25 ENCOUNTER — Other Ambulatory Visit (HOSPITAL_COMMUNITY): Payer: Self-pay

## 2021-04-26 ENCOUNTER — Ambulatory Visit (HOSPITAL_BASED_OUTPATIENT_CLINIC_OR_DEPARTMENT_OTHER): Payer: 59 | Admitting: Physical Therapy

## 2021-05-01 ENCOUNTER — Encounter (HOSPITAL_BASED_OUTPATIENT_CLINIC_OR_DEPARTMENT_OTHER): Payer: Self-pay | Admitting: Physical Therapy

## 2021-05-01 ENCOUNTER — Other Ambulatory Visit: Payer: Self-pay

## 2021-05-01 ENCOUNTER — Ambulatory Visit (HOSPITAL_BASED_OUTPATIENT_CLINIC_OR_DEPARTMENT_OTHER): Payer: 59 | Admitting: Physical Therapy

## 2021-05-01 DIAGNOSIS — M25631 Stiffness of right wrist, not elsewhere classified: Secondary | ICD-10-CM

## 2021-05-01 DIAGNOSIS — M25531 Pain in right wrist: Secondary | ICD-10-CM

## 2021-05-01 DIAGNOSIS — M6281 Muscle weakness (generalized): Secondary | ICD-10-CM | POA: Diagnosis not present

## 2021-05-01 NOTE — Therapy (Signed)
[facility] [place], [place], [address] Phone: [phone]   Fax:  [phone]  Physical Therapy Treatment  Patient Details  Name: Natasha Huffman MRN: [id number] Date of Birth: Mar 05, 1961 Referring Provider (PT): [name] MD   Encounter Date: 05/01/2021   PT End of Session - 05/01/21 1217     Visit Number 8    Number of Visits 13    Date for PT Re-Evaluation 05/15/21    Authorization Type [employer]    PT Start Time 5188    PT Stop Time 4166    PT Time Calculation (min) 43 min    Activity Tolerance Patient tolerated treatment well    Behavior During Therapy [facility] for tasks assessed/performed             Past Medical History:  Diagnosis Date   Breast cancer ([place]) 2020   Left Breast Cancer   Cancer ([place]) 09/23/2018   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 ,    Gallstones    GERD (gastroesophageal reflux disease)    resolved    Peripheral neuropathy due to chemotherapy [facility])    Personal history of chemotherapy 2020   Left Breast Cancer   Personal history of radiation therapy 2020   Left Breast Cancer    Past Surgical History:  Procedure Laterality Date   BREAST EXCISIONAL BIOPSY Left 04/08/2019   BREAST LUMPECTOMY Left 04/08/2019   BREAST LUMPECTOMY WITH RADIOACTIVE SEED AND SENTINEL LYMPH NODE BIOPSY Left 04/08/2019   Procedure: LEFT BREAST RADIOACTIVE SEED LUMPECTOMY X2 AND LEFT SENTINEL LYMPH NODE [place];  Surgeon: [name], MD;  Location: [place];  Service: General;  Laterality: Left;   [place]; 1995   CHOLECYSTECTOMY N/A 02/22/2015   Procedure: LAPAROSCOPIC CHOLECYSTECTOMY WITH INTRAOPERATIVE CHOLANGIOGRAM;  Surgeon: [name], MD;  Location: [place];  Service: General;  Laterality: N/A;   LAPAROSCOPIC CHOLECYSTECTOMY  02/22/2015   PORT-A-CATH REMOVAL N/A 02/03/2020   Procedure: REMOVAL PORT-A-CATH;  Surgeon: [name], MD;  Location: [place];  Service: General;  Laterality: N/A;   PORTACATH PLACEMENT Right 10/08/2018   Procedure: INSERTION PORT-A-CATH WITH ULTRASOUND;  Surgeon: [name], MD;  Location: [place];  Service: General;  Laterality: Right;   TUBAL LIGATION  1995    There were no vitals filed for this visit.   Subjective Assessment - 05/01/21 1201     Subjective Pt denies any adverse effects after prior Rx. Pt denies pain currently.  Pt is not wearing her brace as much.  Pt is able to do more things without thinking about it.  Pt able to cook and washing dishes with less pain.  Pt c/o's of pain with rotating forearm and certain positions/quick movements though overall better.  She can have pain with trying to rub or scratch her upper back.  Pt reports improved pain overall and she can go longer without brace.  Able to hold grandson for longer periods of time, but uses brace.  Pt states she is using her R UE a little more with bathing with little pain.  pt reports compliance with HEP including using the putty.  Pt denies pain currently.    Currently in Pain? No/denies                               [facility] Adult PT Treatment/Exercise - 05/01/21 0001  Exercises   Exercises Wrist;Hand;Elbow   Reviewed response to prior Rx, HEP compliance, current function, and pain level..     Elbow Exercises   Other elbow exercises supination/pronation with 2# 3x10 reps      Hand Exercises   Opposition Limitations 2x10 reps with drag    Other Hand Exercises pinch and pull with yellow putty 2x10 reps      Wrist Exercises   Other wrist exercises Pt received R wrist flexion and ext PROM,    Other wrist exercises wrist extension AROM 3x10 with 2#. Eccentric Radial deviation with YTB 2 x 10 reps.  standing ulnar deviation 1# 2x10 reps                     PT Education - 05/01/21 1243     Education Details Updated HEP and gave pt a HEP handout.  Educted pt in correct form and  appropriate frequency.  Access Code: [email or web address]  URL: [email or web address]  Date: 05/01/2021  Prepared by: Ronny Flurry    Exercises  Resisted Finger Extension and Thumb Abduction - 1 x daily - 7 x weekly - 3 sets - 10 reps  Thumb Opposition - 1 x daily - 7 x weekly - 3 sets - 10 reps  Seated Composite Thumb Flexion AROM - 1 x daily - 7 x weekly - 3 sets - 10 reps  Seated Gripping Towel - 2 x daily - 5 x weekly - 2 sets - 10 reps  Seated Wrist Extension with Dumbbell - 1 x daily - 3-4 x weekly - 3 sets - 10 reps  Seated Forearm Pronation and Supination AROM - 1 x daily - 3-4 x weekly - 3 sets - 10 reps    Person(s) Educated Patient    Methods Explanation;Demonstration;Verbal cues;Handout    Comprehension Returned demonstration;Verbalized understanding              PT Short Term Goals - 04/17/21 1210       PT SHORT TERM GOAL #1   Title Patient will show 30% improvement in pain with work related activties.    Baseline reported 50% improvement (04/17/21)    Time 3    Period Weeks    Status Achieved    Target Date 03/20/21      PT SHORT TERM GOAL #2   Title Patient will improve hand dynometer strength by 5 lbs.    Time 3    Status Not Met    Target Date 03/20/21      PT SHORT TERM GOAL #3   Title Patient will increase range of motion of wrist to 85% compared to left hand.    Baseline met except flexion    Time 3    Period Weeks    Status Partially Met    Target Date 03/20/21      PT SHORT TERM GOAL #4   Title Pt will be independent and compliant with HEP for improved pain, ROM, strength, and function.    Time 3    Period Weeks    Status Achieved    Target Date 03/20/21               PT Long Term Goals - 04/17/21 1518       PT LONG TERM GOAL #1   Title Patient will improve right hand supination range of motion in order to perform ADL's such as toileting.    Time 4    Status Partially Met  Target Date 05/15/21      PT LONG TERM GOAL #2   Title  Patient will perform work related activities and have less than 2/10 pain at end of work day.    Time 4    Period Weeks    Status On-going    Target Date 05/15/21      PT LONG TERM GOAL #3   Title Patient will demostrated equal strength in right wrist flexion, radial/ulner deviation, and hand grip equal to that of left in order to carry grandson safely.    Time 4    Period Weeks    Status Not Met    Target Date 05/15/21                   Plan - 05/01/21 1531     Clinical Impression Statement Pt is progressing well with pain, sx's, and function. She is able to go without brace for longer periods.  Pt performed exercises well with cuing for correct form and positioning.  Pt was able to perform T band eccentric radial deviation and 1# ulnar deviation without c/o's or inreased pain.  Pt had an appropriate response to IASTM and responds well to IASTM. She responded well to Rx having no c/o's after Rx. Pt should cont to benefit from cont skilled PT services to address ongoing goals and to restore PLOF    PT Treatment/Interventions ADLs/Self Care Home Management;Cryotherapy;Electrical Stimulation;Iontophoresis 4mg /ml Dexamethasone;Moist Heat;Ultrasound;Therapeutic exercise;Therapeutic activities;Patient/family education;Manual techniques;Passive range of motion;Dry needling;Splinting;Taping;Joint Manipulations;Neuromuscular re-education    PT Next Visit Plan Cont with wrist and hand ROM and strengthening per pt tolrance.  Cont with IASTM.    PT Home Exercise Plan HEP CODE: [email or web address]    Consulted and Agree with Plan of Care Patient             Patient will benefit from skilled therapeutic intervention in order to improve the following deficits and impairments:  Decreased activity tolerance, Decreased range of motion, Decreased endurance, Decreased strength, Impaired perceived functional ability, Impaired UE functional use, Pain, Increased fascial restricitons, Hypomobility  Visit  Diagnosis: Pain in right wrist  Stiffness of right wrist, not elsewhere classified  Muscle weakness (generalized)     Problem List Patient Active Problem List   Diagnosis Date Noted   Hepatic steatosis 01/04/2021   Neuropathy due to chemotherapeutic drug ([place]) 03/16/2019   Port-A-Cath in place 01/11/2019   Chemotherapy induced neutropenia ([place]) 12/22/2018   Malignant neoplasm of upper-outer quadrant of left breast in female, estrogen receptor positive ([place]) 09/28/2018   Acute calculous cholecystitis 02/22/2015   Knee pain 05/04/2012    Selinda Michaels III PT, DPT 05/01/21 3:39 PM   [facility] [address] [place], [place], [address] Phone: [phone]   Fax:  [phone]  Name: SHEILIA REZNICK MRN: [id number] Date of Birth: 28-Nov-1960

## 2021-05-03 ENCOUNTER — Encounter: Payer: Self-pay | Admitting: Oncology

## 2021-05-03 ENCOUNTER — Ambulatory Visit (HOSPITAL_BASED_OUTPATIENT_CLINIC_OR_DEPARTMENT_OTHER): Payer: 59 | Admitting: Physical Therapy

## 2021-05-07 ENCOUNTER — Ambulatory Visit (HOSPITAL_BASED_OUTPATIENT_CLINIC_OR_DEPARTMENT_OTHER): Payer: 59 | Admitting: Physical Therapy

## 2021-05-07 ENCOUNTER — Other Ambulatory Visit (HOSPITAL_COMMUNITY): Payer: Self-pay

## 2021-05-07 ENCOUNTER — Other Ambulatory Visit: Payer: Self-pay

## 2021-05-07 ENCOUNTER — Encounter (HOSPITAL_BASED_OUTPATIENT_CLINIC_OR_DEPARTMENT_OTHER): Payer: Self-pay | Admitting: Physical Therapy

## 2021-05-07 DIAGNOSIS — M6281 Muscle weakness (generalized): Secondary | ICD-10-CM

## 2021-05-07 DIAGNOSIS — M25531 Pain in right wrist: Secondary | ICD-10-CM

## 2021-05-07 DIAGNOSIS — M25631 Stiffness of right wrist, not elsewhere classified: Secondary | ICD-10-CM | POA: Diagnosis not present

## 2021-05-07 NOTE — Therapy (Addendum)
[facility] [address] [place], [place], [address] Phone: [phone]   Fax:  [phone]  Physical Therapy Evaluation  Patient Details  Name: Natasha Huffman MRN: [id number] Date of Birth: 1960/11/10 Referring Provider (PT): [name] MD   Encounter Date: 05/07/2021   PT End of Session - 05/07/21 1238     Visit Number 9    Number of Visits 13    Date for PT Re-Evaluation 05/15/21    Authorization Type [employer]    PT Start Time 2992    PT Stop Time 1234    PT Time Calculation (min) 39 min    Activity Tolerance Patient tolerated treatment well    Behavior During Therapy [facility] for tasks assessed/performed             Past Medical History:  Diagnosis Date   Breast cancer ([place]) 2020   Left Breast Cancer   Cancer ([place]) 09/23/2018   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 ,    Gallstones    GERD (gastroesophageal reflux disease)    resolved    Peripheral neuropathy due to chemotherapy [facility])    Personal history of chemotherapy 2020   Left Breast Cancer   Personal history of radiation therapy 2020   Left Breast Cancer    Past Surgical History:  Procedure Laterality Date   BREAST EXCISIONAL BIOPSY Left 04/08/2019   BREAST LUMPECTOMY Left 04/08/2019   BREAST LUMPECTOMY WITH RADIOACTIVE SEED AND SENTINEL LYMPH NODE BIOPSY Left 04/08/2019   Procedure: LEFT BREAST RADIOACTIVE SEED LUMPECTOMY X2 AND LEFT SENTINEL LYMPH NODE [place];  Surgeon: [name], MD;  Location: [facility];  Service: General;  Laterality: Left;   [place]; 1995   CHOLECYSTECTOMY N/A 02/22/2015   Procedure: LAPAROSCOPIC CHOLECYSTECTOMY WITH INTRAOPERATIVE CHOLANGIOGRAM;  Surgeon: [name], MD;  Location: [facility];  Service: General;  Laterality: N/A;   LAPAROSCOPIC CHOLECYSTECTOMY  02/22/2015   PORT-A-CATH REMOVAL N/A 02/03/2020   Procedure: REMOVAL PORT-A-CATH;  Surgeon: [name], MD;  Location: [facility];  Service: General;  Laterality: N/A;   PORTACATH PLACEMENT Right 10/08/2018   Procedure: INSERTION PORT-A-CATH WITH ULTRASOUND;  Surgeon: [name], MD;  Location: [facility];  Service: General;  Laterality: Right;   TUBAL LIGATION  1995    There were no vitals filed for this visit.    Subjective Assessment - 05/07/21 1156     Subjective Pt denies any adverse effects after prior Rx. Pt denies pain currently.  Pt is not wearing her brace as much.  Pt is able to do more things without thinking about it.  Pt able to cook and washing dishes with less pain.  Pt c/o's of pain with rotating forearm and certain positions/quick movements though overall better.  She can have pain with trying to rub or scratch her upper back.  Pt reports improved pain overall and she can go longer without brace.  Able to hold grandson for longer periods of time, but uses brace.  Pt states she is using her R UE a little more with bathing with little pain.  pt reports compliance with HEP including using the putty.  Pt denies pain currently.    Pertinent History breast cancer (remission: may 2022 last check ), neuropathy in feet that has subsided/resolved (originally from chemo)    Currently in Pain? No/denies  Objective measurements completed on examination: See above findings.       [facility] Adult PT Treatment/Exercise - 05/07/21 0001       Exercises   Exercises Wrist;Hand;Elbow   Reviewed response to prior Rx, HEP compliance, current function, and pain level.     Elbow Exercises   Other elbow exercises supination/pronation with 3# 2x10 reps      Hand Exercises   Other Hand Exercises pinch and pull with yellow putty 2x10 reps      Wrist Exercises   Other wrist exercises Pt received R wrist flexion and ext PROM and radial and ulnar deviation PROM.    Other wrist exercises wrist flexion in neutral position with gravity minimized 4x10 reps.  wrist  extension x12 with 2# and 2x10 reps with 3#. Eccentric Radial deviation with YTB 2 x 10 reps.  standing ulnar deviation 1# 2x10 reps                       PT Short Term Goals - 04/17/21 1210       PT SHORT TERM GOAL #1   Title Patient will show 30% improvement in pain with work related activties.    Baseline reported 50% improvement (04/17/21)    Time 3    Period Weeks    Status Achieved    Target Date 03/20/21      PT SHORT TERM GOAL #2   Title Patient will improve hand dynometer strength by 5 lbs.    Time 3    Status Not Met    Target Date 03/20/21      PT SHORT TERM GOAL #3   Title Patient will increase range of motion of wrist to 85% compared to left hand.    Baseline met except flexion    Time 3    Period Weeks    Status Partially Met    Target Date 03/20/21      PT SHORT TERM GOAL #4   Title Pt will be independent and compliant with HEP for improved pain, ROM, strength, and function.    Time 3    Period Weeks    Status Achieved    Target Date 03/20/21               PT Long Term Goals - 04/17/21 1518       PT LONG TERM GOAL #1   Title Patient will improve right hand supination range of motion in order to perform ADL's such as toileting.    Time 4    Status Partially Met    Target Date 05/15/21      PT LONG TERM GOAL #2   Title Patient will perform work related activities and have less than 2/10 pain at end of work day.    Time 4    Period Weeks    Status On-going    Target Date 05/15/21      PT LONG TERM GOAL #3   Title Patient will demostrated equal strength in right wrist flexion, radial/ulner deviation, and hand grip equal to that of left in order to carry grandson safely.    Time 4    Period Weeks    Status Not Met    Target Date 05/15/21                    Plan - 05/07/21 1204     Clinical Impression Statement Pt is progressing well with pain, sx's, and function.  She  is not wearing her brace as much.  She is able to  use R UE more with daily activities including with lifting activities.  Pt cont to have difficulty and pain with wrist flexion though is improving with strength in other planes.  Pt unable to perform wrist flexion against gravity though able to perform with gravity minimized.  Increased resistance with wrist/forearm exercises and Pt performed exercises well.  She responded well to Rx having no pain after Rx stating she felt good.  pt should cont to benefit from cont skilled PT services to address ongoing goals and to restore PLOF.    Comorbidities breast cancer (2020 - remission), last check up was May 2022    PT Treatment/Interventions ADLs/Self Care Home Management;Cryotherapy;Electrical Stimulation;Iontophoresis 27m/ml Dexamethasone;Moist Heat;Ultrasound;Therapeutic exercise;Therapeutic activities;Patient/family education;Manual techniques;Passive range of motion;Dry needling;Splinting;Taping;Joint Manipulations;Neuromuscular re-education    PT Next Visit Plan Cont with wrist and hand ROM and strengthening per pt tolrance.  Cont with IASTM.  Probable PN next visit.    PT Home Exercise Plan HEP CODE: [id number]    Consulted and Agree with Plan of Care Patient             Patient will benefit from skilled therapeutic intervention in order to improve the following deficits and impairments:  Decreased activity tolerance, Decreased range of motion, Decreased endurance, Decreased strength, Impaired perceived functional ability, Impaired UE functional use, Pain, Increased fascial restricitons, Hypomobility  Visit Diagnosis: Pain in right wrist  Stiffness of right wrist, not elsewhere classified  Muscle weakness (generalized)     Problem List Patient Active Problem List   Diagnosis Date Noted   Hepatic steatosis 01/04/2021   Neuropathy due to chemotherapeutic drug ([place] 03/16/2019   Port-A-Cath in place 01/11/2019   Chemotherapy induced neutropenia ([place] 12/22/2018   Malignant neoplasm of  upper-outer quadrant of left breast in female, estrogen receptor positive ([place] 09/28/2018   Acute calculous cholecystitis 02/22/2015   Knee pain 05/04/2012    RSelinda MichaelsIII PT, DPT 05/07/21 5:19 PM  PHYSICAL THERAPY DISCHARGE SUMMARY  Visits from Start of Care: 9  Current functional level related to goals / functional outcomes: Pt was making good progress in PT.  See above for assessment on last visit.  Unable to assess current functional status or goals due to pt not present at discharge.     Remaining deficits: See above   Education / Equipment: Pt has a HEP   Pt was seen in PT from 02/27/21 - 05/07/2021.  She did not schedule any further PT appointment after 9/26 and will be considered discharged due to not scheduling any further PT.    RSelinda MichaelsIII PT, DPT 12/17/21 8:30 AM     [place] [address]Phone: [phone]  Fax:  [phone] Name: Natasha BLASMRN: [id number] of Birth: [birth year]

## 2021-07-20 ENCOUNTER — Other Ambulatory Visit (HOSPITAL_COMMUNITY): Payer: Self-pay

## 2021-07-20 DIAGNOSIS — L719 Rosacea, unspecified: Secondary | ICD-10-CM | POA: Diagnosis not present

## 2021-07-20 MED ORDER — AZELAIC ACID 15 % EX GEL
1.0000 "application " | Freq: Two times a day (BID) | CUTANEOUS | 3 refills | Status: DC
  Filled 2021-07-20: qty 50, 30d supply, fill #0

## 2021-08-03 ENCOUNTER — Other Ambulatory Visit (HOSPITAL_BASED_OUTPATIENT_CLINIC_OR_DEPARTMENT_OTHER): Payer: Self-pay

## 2021-08-07 ENCOUNTER — Other Ambulatory Visit (HOSPITAL_COMMUNITY): Payer: Self-pay

## 2021-08-27 DIAGNOSIS — C50412 Malignant neoplasm of upper-outer quadrant of left female breast: Secondary | ICD-10-CM | POA: Diagnosis not present

## 2021-08-27 DIAGNOSIS — Z170 Estrogen receptor positive status [ER+]: Secondary | ICD-10-CM | POA: Diagnosis not present

## 2021-09-21 ENCOUNTER — Other Ambulatory Visit (HOSPITAL_COMMUNITY): Payer: Self-pay

## 2021-09-21 MED ORDER — BENZONATATE 100 MG PO CAPS
200.0000 mg | ORAL_CAPSULE | Freq: Three times a day (TID) | ORAL | 0 refills | Status: DC
  Filled 2021-09-21: qty 60, 10d supply, fill #0

## 2021-09-25 ENCOUNTER — Other Ambulatory Visit (HOSPITAL_COMMUNITY): Payer: Self-pay

## 2021-09-25 DIAGNOSIS — Z03818 Encounter for observation for suspected exposure to other biological agents ruled out: Secondary | ICD-10-CM | POA: Diagnosis not present

## 2021-09-25 DIAGNOSIS — K760 Fatty (change of) liver, not elsewhere classified: Secondary | ICD-10-CM | POA: Diagnosis not present

## 2021-09-25 DIAGNOSIS — R059 Cough, unspecified: Secondary | ICD-10-CM | POA: Diagnosis not present

## 2021-09-25 DIAGNOSIS — R7303 Prediabetes: Secondary | ICD-10-CM | POA: Diagnosis not present

## 2021-09-25 DIAGNOSIS — R509 Fever, unspecified: Secondary | ICD-10-CM | POA: Diagnosis not present

## 2021-09-25 MED ORDER — HYDROCODONE BIT-HOMATROP MBR 5-1.5 MG/5ML PO SOLN
5.0000 mL | Freq: Four times a day (QID) | ORAL | 0 refills | Status: DC | PRN
  Filled 2021-09-25: qty 120, 6d supply, fill #0

## 2021-09-27 ENCOUNTER — Other Ambulatory Visit (HOSPITAL_COMMUNITY): Payer: Self-pay

## 2021-09-27 MED ORDER — AMOXICILLIN-POT CLAVULANATE 875-125 MG PO TABS
1.0000 | ORAL_TABLET | Freq: Two times a day (BID) | ORAL | 0 refills | Status: DC
  Filled 2021-09-27: qty 14, 7d supply, fill #0

## 2021-10-10 ENCOUNTER — Other Ambulatory Visit (HOSPITAL_COMMUNITY): Payer: Self-pay

## 2021-10-10 MED ORDER — FLUCONAZOLE 150 MG PO TABS
ORAL_TABLET | ORAL | 0 refills | Status: DC
  Filled 2021-10-10: qty 2, 5d supply, fill #0

## 2021-10-15 DIAGNOSIS — K760 Fatty (change of) liver, not elsewhere classified: Secondary | ICD-10-CM | POA: Diagnosis not present

## 2021-10-15 DIAGNOSIS — C50919 Malignant neoplasm of unspecified site of unspecified female breast: Secondary | ICD-10-CM | POA: Diagnosis not present

## 2021-10-15 DIAGNOSIS — R7303 Prediabetes: Secondary | ICD-10-CM | POA: Diagnosis not present

## 2021-10-15 DIAGNOSIS — Z13220 Encounter for screening for lipoid disorders: Secondary | ICD-10-CM | POA: Diagnosis not present

## 2021-10-15 DIAGNOSIS — L719 Rosacea, unspecified: Secondary | ICD-10-CM | POA: Diagnosis not present

## 2021-10-15 DIAGNOSIS — Z0000 Encounter for general adult medical examination without abnormal findings: Secondary | ICD-10-CM | POA: Diagnosis not present

## 2021-10-30 DIAGNOSIS — N760 Acute vaginitis: Secondary | ICD-10-CM | POA: Diagnosis not present

## 2021-11-06 DIAGNOSIS — Z124 Encounter for screening for malignant neoplasm of cervix: Secondary | ICD-10-CM | POA: Diagnosis not present

## 2021-11-06 DIAGNOSIS — Z01419 Encounter for gynecological examination (general) (routine) without abnormal findings: Secondary | ICD-10-CM | POA: Diagnosis not present

## 2021-11-08 ENCOUNTER — Other Ambulatory Visit (HOSPITAL_COMMUNITY): Payer: Self-pay

## 2021-11-22 ENCOUNTER — Other Ambulatory Visit: Payer: Self-pay | Admitting: Obstetrics and Gynecology

## 2021-11-22 DIAGNOSIS — Z853 Personal history of malignant neoplasm of breast: Secondary | ICD-10-CM

## 2021-11-27 ENCOUNTER — Other Ambulatory Visit (HOSPITAL_COMMUNITY): Payer: Self-pay

## 2021-11-27 MED ORDER — DIAZEPAM 5 MG PO TABS
5.0000 mg | ORAL_TABLET | ORAL | 0 refills | Status: DC
  Filled 2021-11-27: qty 30, 15d supply, fill #0

## 2021-11-28 ENCOUNTER — Other Ambulatory Visit (HOSPITAL_COMMUNITY): Payer: Self-pay

## 2021-12-19 ENCOUNTER — Ambulatory Visit
Admission: RE | Admit: 2021-12-19 | Discharge: 2021-12-19 | Disposition: A | Discharge status: Home / Self Care | Payer: 59 | Source: Ambulatory Visit | Attending: Obstetrics and Gynecology | Admitting: Obstetrics and Gynecology

## 2021-12-19 DIAGNOSIS — Z853 Personal history of malignant neoplasm of breast: Secondary | ICD-10-CM

## 2021-12-19 DIAGNOSIS — R928 Other abnormal and inconclusive findings on diagnostic imaging of breast: Secondary | ICD-10-CM | POA: Diagnosis not present

## 2022-01-16 DIAGNOSIS — K760 Fatty (change of) liver, not elsewhere classified: Secondary | ICD-10-CM | POA: Diagnosis not present

## 2022-02-05 ENCOUNTER — Other Ambulatory Visit: Payer: Self-pay | Admitting: Adult Health

## 2022-02-05 ENCOUNTER — Other Ambulatory Visit (HOSPITAL_COMMUNITY): Payer: Self-pay

## 2022-02-05 MED ORDER — TAMOXIFEN CITRATE 20 MG PO TABS
20.0000 mg | ORAL_TABLET | Freq: Every day | ORAL | 3 refills | Status: DC
  Filled 2022-02-05: qty 90, 90d supply, fill #0

## 2022-03-08 ENCOUNTER — Other Ambulatory Visit (HOSPITAL_COMMUNITY): Payer: Self-pay

## 2022-03-08 DIAGNOSIS — F40243 Fear of flying: Secondary | ICD-10-CM | POA: Diagnosis not present

## 2022-03-08 DIAGNOSIS — R41840 Attention and concentration deficit: Secondary | ICD-10-CM | POA: Diagnosis not present

## 2022-03-08 DIAGNOSIS — F411 Generalized anxiety disorder: Secondary | ICD-10-CM | POA: Diagnosis not present

## 2022-03-08 MED ORDER — DIAZEPAM 5 MG PO TABS
5.0000 mg | ORAL_TABLET | ORAL | 0 refills | Status: AC
  Filled 2022-03-08: qty 30, 15d supply, fill #0

## 2022-03-08 MED ORDER — ESCITALOPRAM OXALATE 10 MG PO TABS
10.0000 mg | ORAL_TABLET | Freq: Every day | ORAL | 0 refills | Status: DC
  Filled 2022-03-08: qty 90, 90d supply, fill #0

## 2022-03-19 ENCOUNTER — Other Ambulatory Visit: Payer: Self-pay

## 2022-03-19 ENCOUNTER — Encounter (HOSPITAL_COMMUNITY): Payer: Self-pay | Admitting: *Deleted

## 2022-03-19 ENCOUNTER — Ambulatory Visit (HOSPITAL_COMMUNITY)
Admission: EM | Admit: 2022-03-19 | Discharge: 2022-03-19 | Disposition: A | Discharge status: Home / Self Care | Payer: 59 | Attending: Family Medicine | Admitting: Family Medicine

## 2022-03-19 DIAGNOSIS — T783XXA Angioneurotic edema, initial encounter: Secondary | ICD-10-CM | POA: Diagnosis not present

## 2022-03-19 MED ORDER — PREDNISONE 20 MG PO TABS: 40.0000 mg | ORAL_TABLET | Freq: Every day | ORAL | 0 refills | Status: DC

## 2022-03-19 NOTE — ED Triage Notes (Signed)
Pt reports her upper lip started to swell this morning. Pt started Lexapro 1/2 tab on 7-28. Pt has no other new meds.

## 2022-03-19 NOTE — Discharge Instructions (Signed)
Stop taking Lexapro. Your lip swelling should slowly resolve in the next 24-72 hours. If you feel this is worsening please proceed directly to the Emergency Department.  Call your doctor tomorrow to inform him or her of your symptoms and to ask about alternative medications to treat your anxiety.

## 2022-03-20 NOTE — ED Provider Notes (Signed)
[facility]   [id number] 03/19/22 Arrival Time: [place] PLAN:  1. Angioedema, initial encounter    See AVS for d/c information. No resp/swallowing difficulties. No s/s of anaphylaxis. Swelling has not worsened over the past hour.    Discharge Instructions      Stop taking Lexapro. Your lip swelling should slowly resolve in the next 24-72 hours. If you feel this is worsening please proceed directly to the Emergency Department.  Call your doctor tomorrow to inform him or her of your symptoms and to ask about alternative medications to treat your anxiety.     Thought likely low benefit, will begin: Meds ordered this encounter  Medications   predniSONE (DELTASONE) 20 MG tablet    Sig: Take 2 tablets (40 mg total) by mouth daily.    Dispense:  10 tablet    Refill:  0    Reviewed expectations re: course of current medical issues. Questions answered. Outlined signs and symptoms indicating need for more acute intervention. Patient verbalized understanding. After Visit Summary given.   SUBJECTIVE: History from: patient. Natasha Huffman is a 61 y.o. female who presents for evaluation of a possible allergic reaction. Started Lexapro last week. Reports noting upper lip swelling this am. Has stopped Lexapro. No other possible triggers identified. No tongue swelling. No resp/swallowing difficulties. Patient denies similar previous reactions/symptoms. Benadryl prior to arrival. No response. Ambulatory without assistance.  OBJECTIVE:  Vitals:   03/19/22 1848  BP: 130/86  Pulse: 85  Resp: 18  Temp: 98.7 F (37.1 C)  SpO2: 100%    General appearance: alert; no distress Eyes: PERRLA; EOMI; conjunctiva normal HENT: normocephalic; atraumatic; significant swelling of upper lip; nasal mucosa normal; oral mucosa normal Neck: supple  Lungs: clear to auscultation bilaterally Extremities: no cyanosis or edema; symmetrical with no gross deformities Skin: warm  and dry; no rashes Neurologic: normal gait Psychological: alert and cooperative; normal mood and affect   No Known Allergies  Past Medical History:  Diagnosis Date   Breast cancer ([place]) 2020   Left Breast Cancer   Cancer ([place]) 09/23/2018   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 ,    Gallstones    GERD (gastroesophageal reflux disease)    resolved    Peripheral neuropathy due to chemotherapy [facility])    Personal history of chemotherapy 2020   Left Breast Cancer   Personal history of radiation therapy 2020   Left Breast Cancer   Social History   Socioeconomic History   Marital status: Married    Spouse name: Not on file   Number of children: Not on file   Years of education: Not on file   Highest education level: Not on file  Occupational History   Not on file  Tobacco Use   Smoking status: Never   Smokeless tobacco: Never  Substance and Sexual Activity   Alcohol use: No   Drug use: No   Sexual activity: Yes    Birth control/protection: Post-menopausal  Other Topics Concern   Not on file  Social History Narrative   Not on file   Social Determinants of Health   Financial Resource Strain: Not on file  Food Insecurity: Not on file  Transportation Needs: No Transportation Needs (04/27/2019)   PRAPARE - Transportation    Lack of Transportation (Medical): No    Lack of Transportation (Non-Medical): No  Physical Activity: Not on file  Stress: Not on file  Social Connections: Not on file  Intimate Partner Violence:  Not on file   Family History  Problem Relation Age of Onset   Hypertension Sister    Colon cancer Maternal Aunt    Breast cancer Neg Hx    Past Surgical History:  Procedure Laterality Date   BREAST EXCISIONAL BIOPSY Left 04/08/2019   BREAST LUMPECTOMY Left 04/08/2019   BREAST LUMPECTOMY WITH RADIOACTIVE SEED AND SENTINEL LYMPH NODE BIOPSY Left 04/08/2019   Procedure: LEFT BREAST RADIOACTIVE SEED LUMPECTOMY X2 AND LEFT SENTINEL LYMPH NODE MAPPING;  Surgeon:  [name], MD;  Location: [facility];  Service: General;  Laterality: Left;   [place]; 1995   CHOLECYSTECTOMY N/A 02/22/2015   Procedure: LAPAROSCOPIC CHOLECYSTECTOMY WITH INTRAOPERATIVE CHOLANGIOGRAM;  Surgeon: [name], MD;  Location: [facility];  Service: General;  Laterality: N/A;   LAPAROSCOPIC CHOLECYSTECTOMY  02/22/2015   PORT-A-CATH REMOVAL N/A 02/03/2020   Procedure: REMOVAL PORT-A-CATH;  Surgeon: [name], MD;  Location: [facility];  Service: General;  Laterality: N/A;   PORTACATH PLACEMENT Right 10/08/2018   Procedure: INSERTION PORT-A-CATH WITH ULTRASOUND;  Surgeon: [name], MD;  Location: [facility];  Service: General;  Laterality: Right;   [place]     Vanessa Kick, MD 03/20/22 [id number]

## 2022-03-25 ENCOUNTER — Other Ambulatory Visit (HOSPITAL_COMMUNITY): Payer: Self-pay

## 2022-03-25 MED ORDER — VENLAFAXINE HCL ER 37.5 MG PO CP24
37.5000 mg | ORAL_CAPSULE | Freq: Every day | ORAL | 0 refills | Status: DC
  Filled 2022-03-25: qty 90, 90d supply, fill #0

## 2022-03-29 DIAGNOSIS — F411 Generalized anxiety disorder: Secondary | ICD-10-CM | POA: Diagnosis not present

## 2022-04-30 ENCOUNTER — Other Ambulatory Visit (HOSPITAL_COMMUNITY): Payer: Self-pay

## 2022-04-30 DIAGNOSIS — F411 Generalized anxiety disorder: Secondary | ICD-10-CM | POA: Diagnosis not present

## 2022-04-30 MED ORDER — VENLAFAXINE HCL ER 75 MG PO CP24
75.0000 mg | ORAL_CAPSULE | Freq: Every day | ORAL | 3 refills | Status: AC
  Filled 2022-04-30: qty 90, 90d supply, fill #0
  Filled 2022-10-25: qty 90, 90d supply, fill #1

## 2022-05-08 ENCOUNTER — Other Ambulatory Visit: Payer: 59

## 2022-05-08 ENCOUNTER — Ambulatory Visit: Payer: 59 | Admitting: Hematology and Oncology

## 2022-05-09 ENCOUNTER — Other Ambulatory Visit: Payer: 59

## 2022-05-09 ENCOUNTER — Ambulatory Visit: Payer: 59 | Admitting: Hematology and Oncology

## 2022-05-13 ENCOUNTER — Other Ambulatory Visit: Payer: Self-pay | Admitting: *Deleted

## 2022-05-13 DIAGNOSIS — C50412 Malignant neoplasm of upper-outer quadrant of left female breast: Secondary | ICD-10-CM

## 2022-05-14 ENCOUNTER — Inpatient Hospital Stay: Payer: 59 | Attending: Adult Health

## 2022-05-14 ENCOUNTER — Inpatient Hospital Stay (HOSPITAL_BASED_OUTPATIENT_CLINIC_OR_DEPARTMENT_OTHER): Payer: 59 | Admitting: Adult Health

## 2022-05-14 ENCOUNTER — Encounter: Payer: Self-pay | Admitting: Adult Health

## 2022-05-14 ENCOUNTER — Telehealth: Payer: Self-pay | Admitting: *Deleted

## 2022-05-14 VITALS — BP 134/81 | HR 75 | Temp 97.9°F | Resp 18 | Ht 62.0 in | Wt 194.4 lb

## 2022-05-14 DIAGNOSIS — Z923 Personal history of irradiation: Secondary | ICD-10-CM | POA: Insufficient documentation

## 2022-05-14 DIAGNOSIS — C50412 Malignant neoplasm of upper-outer quadrant of left female breast: Secondary | ICD-10-CM | POA: Diagnosis not present

## 2022-05-14 DIAGNOSIS — Z170 Estrogen receptor positive status [ER+]: Secondary | ICD-10-CM

## 2022-05-14 DIAGNOSIS — R748 Abnormal levels of other serum enzymes: Secondary | ICD-10-CM | POA: Diagnosis not present

## 2022-05-14 DIAGNOSIS — Z853 Personal history of malignant neoplasm of breast: Secondary | ICD-10-CM | POA: Insufficient documentation

## 2022-05-14 DIAGNOSIS — Z800 Family history of malignant neoplasm of digestive organs: Secondary | ICD-10-CM | POA: Diagnosis not present

## 2022-05-14 LAB — CMP (CANCER CENTER ONLY)
ALT: 103 U/L — ABNORMAL HIGH (ref 0–44)
AST: 85 U/L — ABNORMAL HIGH (ref 15–41)
Albumin: 4 g/dL (ref 3.5–5.0)
Alkaline Phosphatase: 86 U/L (ref 38–126)
Anion gap: 4 — ABNORMAL LOW (ref 5–15)
BUN: 13 mg/dL (ref 6–20)
CO2: 29 mmol/L (ref 22–32)
Calcium: 9.1 mg/dL (ref 8.9–10.3)
Chloride: 107 mmol/L (ref 98–111)
Creatinine: 0.66 mg/dL (ref 0.44–1.00)
GFR, Estimated: >60 mL/min (ref >60)
Glucose, Bld: 109 mg/dL — ABNORMAL HIGH (ref 70–99)
Potassium: 4.6 mmol/L (ref 3.5–5.1)
Sodium: 140 mmol/L (ref 135–145)
Total Bilirubin: 0.4 mg/dL (ref 0.3–1.2)
Total Protein: 7 g/dL (ref 6.5–8.1)

## 2022-05-14 LAB — CBC WITH DIFFERENTIAL (CANCER CENTER ONLY)
Abs Immature Granulocytes: 0.02 10*3/uL (ref 0.00–0.07)
Basophils Absolute: 0 10*3/uL (ref 0.0–0.1)
Basophils Relative: 0 %
Eosinophils Absolute: 0.1 10*3/uL (ref 0.0–0.5)
Eosinophils Relative: 2 %
HCT: 36.3 % (ref 36.0–46.0)
Hemoglobin: 11.6 g/dL — ABNORMAL LOW (ref 12.0–15.0)
Immature Granulocytes: 0 %
Lymphocytes Relative: 36 %
Lymphs Abs: 1.9 10*3/uL (ref 0.7–4.0)
MCH: 27.5 pg (ref 26.0–34.0)
MCHC: 32 g/dL (ref 30.0–36.0)
MCV: 86 fL (ref 80.0–100.0)
Monocytes Absolute: 0.5 10*3/uL (ref 0.1–1.0)
Monocytes Relative: 9 %
Neutro Abs: 2.8 10*3/uL (ref 1.7–7.7)
Neutrophils Relative %: 53 %
Platelet Count: 240 10*3/uL (ref 150–400)
RBC: 4.22 MIL/uL (ref 3.87–5.11)
RDW: 12.9 % (ref 11.5–15.5)
WBC Count: 5.4 10*3/uL (ref 4.0–10.5)
nRBC: 0 % (ref 0.0–0.2)

## 2022-05-14 NOTE — Progress Notes (Signed)
Natasha Natasha Huffman:    Natasha Arabian, MD [address] Natasha Natasha Huffman [address]   DIAGNOSIS: Cancer Staging  Malignant neoplasm of upper-outer quadrant of left breast in female, estrogen receptor positive (Natasha Natasha Huffman) Staging form: Breast, AJCC 8th Edition - Clinical: Stage IA (cT1c, cN0, cM0, G3, ER+, PR+, HER2+) - Signed by Natasha Cruel, MD on 01/04/2021 Method of lymph node assessment: Clinical Histologic grading system: 3 grade system Laterality: Left Tumor size (mm): 18   SUMMARY OF ONCOLOGIC HISTORY: Per Dr. Virgie Huffman office visit from May 2022.  [name], [place] woman status post left breast upper outer quadrant biopsy 09/23/2018 for a clinical T1c N0, stage IA invasive ductal carcinoma, grade 3, triple positive, with an MIB-1 of 40%   (1) neoadjuvant chemotherapy consisting of carboplatin, docetaxel, and trastuzumab started 10/22/2018, to be repeated every 21 days x 6             (a) echocardiogram on 10/07/2018 shows a LVEF of 60-65%.             (b) docetaxel discontinued and gemcitabine substituted with cycle 5 because of neuropathy   (2) trastuzumab to continue to complete a year              (a) echocardiogram 01/25/2019 showed an ejection fraction of greater than 65%             (b) trastuzumab changed to T-DM1 post-op   (3) status post left lumpectomy and sentinel lymph node sampling 04/08/2019 showing a residual ypT1c ypN0 residual invasive ductal carcinoma, grade 3, margins negative, again HER-2 positive             (a) 4 lymph nodes were negative   (4) adjuvant TDM-1 started 05/18/2019 due to residual disease after neoadjuvant chemotherapy, completed 7 cycles 10/12/2019             (a) final echo 11/24/2019 showed an ejection fraction in the 55-60% range (4) adjuvant radiation 05/12/2019 - 06/25/2019             (a) left breast / 50.4 Gy in 28 fractions             (b) seroma boost / 10 Gy in 5 fractions   (5) started  tamoxifen August 13, 2019  CURRENT THERAPY: Tamoxifen daily  INTERVAL HISTORY: 62 Z Natasha Natasha Huffman y.o. female returns for follow-Huffman of her history of left breast triple positive invasive ductal carcinoma.  She continues on tamoxifen daily with good tolerance.  She has occasional hot flashes but these are manageable.  She notes that she was recently started on [name] for anxiety.  Her dose increased to 75 mg daily a couple weeks ago and she has had a slight increase in headaches.  She denies any other significant issues.  She did have elevated liver enzymes today.  She has undergone MRI for this noted below.  She is following Huffman with Dr. Marisue Huffman about this.  Her most recent mammogram was completed on Dec 19, 2021 and showed no mammographic evidence of malignancy and breast density category B.  Elevated liver enzymes were noted in May 2022 when she underwent MRI of the liver which found no evidence for this elevation in her labs.   Patient Active Problem List   Diagnosis Date Noted  . Hepatic steatosis 01/04/2021  . Neuropathy due to chemotherapeutic drug ([name]) 03/16/2019  . Port-A-Cath in place 01/11/2019  . Chemotherapy induced neutropenia ([place]) 12/22/2018  .  Malignant neoplasm of upper-outer quadrant of left breast in female, estrogen receptor positive ([place]) 09/28/2018  . Acute calculous cholecystitis 02/22/2015  . Knee pain 05/04/2012    has No Known Allergies.  MEDICAL HISTORY: Past Medical History:  Diagnosis Date  . Breast cancer (Natasha Huffman) 2020   Left Breast Cancer  . 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 Gallstones   . GERD (gastroesophageal reflux disease)    resolved   . Peripheral neuropathy due to chemotherapy (Natasha Huffman)   . Personal history of chemotherapy 2020   Left Breast Cancer  . Personal history of radiation therapy 2020   Left Breast Cancer    SURGICAL HISTORY: Past Surgical History:  Procedure Laterality Date  . BREAST EXCISIONAL  BIOPSY Left 04/08/2019  . BREAST LUMPECTOMY Left 04/08/2019  . BREAST LUMPECTOMY WITH RADIOACTIVE SEED AND SENTINEL LYMPH NODE BIOPSY Left 04/08/2019   Procedure: LEFT BREAST RADIOACTIVE SEED LUMPECTOMY X2 AND LEFT SENTINEL LYMPH NODE MAPPING;  Surgeon: [name], MD;  Location: [facility];  Service: General;  Laterality: Left;  . [place]; 1995  . CHOLECYSTECTOMY N/A 02/22/2015   Procedure: LAPAROSCOPIC CHOLECYSTECTOMY WITH INTRAOPERATIVE CHOLANGIOGRAM;  Surgeon: [name], MD;  Location: [facility];  Service: General;  Laterality: N/A;  . LAPAROSCOPIC CHOLECYSTECTOMY  02/22/2015  . PORT-A-CATH REMOVAL N/A 02/03/2020   Procedure: REMOVAL PORT-A-CATH;  Surgeon: [name], MD;  Location: Natasha Natasha [facility];  Service: General;  Laterality: N/A;  . PORTACATH PLACEMENT Right 10/08/2018   Procedure: INSERTION PORT-A-CATH WITH ULTRASOUND;  Surgeon: [name], MD;  Location: [facility] Natasha [facility];  Service: General;  Laterality: Right;  . TUBAL LIGATION  1995    SOCIAL HISTORY: Social History   Socioeconomic History  . Marital status: Married    Spouse name: Not on file  . Number of children: Not on file  . Years of education: Not on file  . Highest education level: Not on file  Occupational History  . Not on file  Tobacco Use  . Smoking status: Never  . Smokeless tobacco: Never  Substance and Sexual Activity  . Alcohol use: No  . Drug use: No  . Sexual activity: Yes    Birth control/protection: Post-menopausal  Other Topics Concern  . Not on file  Social History Narrative  . Not on file   Social Determinants of Health   Financial Resource Strain: Not on file  Food Insecurity: Not on file  Transportation Needs: No Transportation Needs (04/27/2019)   PRAPARE - Transportation   . Lack of Transportation (Medical): No   . Lack of Transportation (Non-Medical): No  Physical Activity: Not on file  Stress: Not on file  Social Connections: Not on  file  Intimate Partner Violence: Not on file    FAMILY HISTORY: Family History  Problem Relation Age of Onset  . Hypertension Sister   . Colon cancer Maternal Aunt   . Breast cancer Neg Hx     Review of Systems  Constitutional:  Positive for fatigue. Negative for appetite change, chills, fever and unexpected weight change.  HENT:   Negative for hearing loss, lump/mass and trouble swallowing.   Eyes:  Negative for eye problems and icterus.  Respiratory:  Negative for chest tightness, cough and shortness of breath.   Cardiovascular:  Negative for chest pain, leg swelling and palpitations.  Gastrointestinal:  Negative for abdominal distention, abdominal pain, constipation, diarrhea, nausea and vomiting.  Endocrine: Negative for hot flashes.  Genitourinary:  Negative for difficulty urinating.   Musculoskeletal:  Negative for arthralgias.  Skin:  Negative for itching and rash.  Neurological:  Negative for dizziness, extremity weakness, headaches and numbness.  Hematological:  Negative for adenopathy. Does not bruise/bleed easily.  Psychiatric/Behavioral:  Negative for depression. The patient is not nervous/anxious.       PHYSICAL EXAMINATION  ECOG PERFORMANCE STATUS: {CHL ONC ECOG XH:[id number]}  Vitals:   05/14/22 0913  BP: 134/81  Pulse: 75  Resp: 18  Temp: 97.9 F (36.6 C)  SpO2: 100%    Physical Exam Constitutional:      General: She is not in acute distress.    Appearance: Normal appearance. She is not toxic-appearing.  HENT:     Head: Normocephalic and atraumatic.  Eyes:     General: No scleral icterus. Cardiovascular:     Rate and Rhythm: Normal rate and regular rhythm.     Pulses: Normal pulses.     Heart sounds: Normal heart sounds.  Pulmonary:     Effort: Pulmonary effort is normal.     Breath sounds: Normal breath sounds.  Abdominal:     General: Abdomen is flat. Bowel sounds are normal. There is no distension.     Palpations: Abdomen is soft.      Tenderness: There is no abdominal tenderness.  Musculoskeletal:        General: No swelling.     Cervical back: Neck supple.  Lymphadenopathy:     Cervical: No cervical adenopathy.  Skin:    General: Skin is warm and dry.     Findings: No rash.  Neurological:     General: No focal deficit present.     Mental Status: She is alert.  Psychiatric:        Mood and Affect: Mood normal.        Behavior: Behavior normal.    LABORATORY DATA:  CBC    Component Value Date/Time   WBC 5.4 05/14/2022 0906   WBC 3.9 (L) 01/04/2021 0857   RBC 4.22 05/14/2022 0906   HGB 11.6 (L) 05/14/2022 0906   HCT 36.3 05/14/2022 0906   PLT 240 05/14/2022 0906   MCV 86.0 05/14/2022 0906   MCH 27.5 05/14/2022 0906   MCHC 32.0 05/14/2022 0906   RDW 12.9 05/14/2022 0906   LYMPHSABS 1.9 05/14/2022 0906   MONOABS 0.5 05/14/2022 0906   EOSABS 0.1 05/14/2022 0906   BASOSABS 0.0 05/14/2022 0906    CMP     Component Value Date/Time   NA 142 01/04/2021 0857   K 4.1 01/04/2021 0857   CL 108 01/04/2021 0857   CO2 25 01/04/2021 0857   GLUCOSE 92 01/04/2021 0857   BUN 12 01/04/2021 0857   CREATININE 0.77 01/04/2021 0857   CREATININE 0.71 01/11/2019 0815   CALCIUM 9.0 01/04/2021 0857   PROT 7.1 01/04/2021 0857   ALBUMIN 3.8 01/04/2021 0857   AST 63 (H) 01/04/2021 0857   AST 19 01/11/2019 0815   ALT 85 (H) 01/04/2021 0857   ALT 42 01/11/2019 0815   ALKPHOS 82 01/04/2021 0857   BILITOT 0.5 01/04/2021 0857   BILITOT 0.3 01/11/2019 0815   GFRNONAA >60 01/04/2021 0857   GFRNONAA >60 01/11/2019 0815   GFRAA >60 01/05/2020 1216   GFRAA >60 01/11/2019 0815       PENDING LABS:   RADIOGRAPHIC STUDIES:  No results found.   PATHOLOGY:     ASSESSMENT and THERAPY PLAN:   No problem-specific Assessment & Plan notes found for  this encounter.   No orders of the defined types were placed in this encounter.   All questions were answered. The patient knows to call the clinic with any problems,  questions or concerns. We can certainly see the patient much sooner if necessary. This note was electronically signed. Scot Dock, NP 05/14/2022

## 2022-05-14 NOTE — Telephone Encounter (Signed)
Labs with results from today's visit faxed to Dr. [name] per Wilber Bihari, NP  Confirmation receipt received- sent to scan.

## 2022-05-15 ENCOUNTER — Telehealth: Payer: Self-pay | Admitting: Adult Health

## 2022-05-15 NOTE — Telephone Encounter (Signed)
Scheduled appointment per 10/3 los. Patient is aware.

## 2022-05-16 NOTE — Assessment & Plan Note (Signed)
Natasha Huffman is a 61 year old woman with history of stage Ia ER/PR positive, HER2 positive invasive ductal carcinoma.  She is status post neoadjuvant chemotherapy, left lumpectomy, adjuvant Kadcyla x7 cycles, and adjuvant tamoxifen which began in January 2021.  She has no clinical or radiographic sign of breast cancer recurrence.  She will continue to undergo annual mammograms next due in May 2024.  The elevated liver enzymes are somewhat concerning.  I reviewed her medications the only medication on her list that can cause an elevation in her liver enzymes is the tamoxifen.  This happens very rarely however I will reach out to her primary care provider and see what kind of work-up she has had thus far.  I imagine it has been quite thorough considering she has undergone ultrasound and MRI.  I will touch base with her once I review everything as we may need to hold tamoxifen for 4 weeks and recheck her liver enzymes to see if tamoxifen is the culprit.  If it is she is postmenopausal and we can change her to anastrozole.  We will touch base after her lab appointment.  She will also return next year for labs and follow-up with Dr. Chryl Heck.

## 2022-05-28 DIAGNOSIS — M25561 Pain in right knee: Secondary | ICD-10-CM | POA: Diagnosis not present

## 2022-05-30 ENCOUNTER — Other Ambulatory Visit: Payer: 59

## 2022-05-30 ENCOUNTER — Ambulatory Visit: Payer: 59 | Admitting: Hematology and Oncology

## 2022-06-17 ENCOUNTER — Other Ambulatory Visit: Payer: Self-pay | Admitting: *Deleted

## 2022-06-17 ENCOUNTER — Inpatient Hospital Stay: Payer: 59 | Attending: Adult Health

## 2022-06-17 ENCOUNTER — Other Ambulatory Visit: Payer: Self-pay

## 2022-06-17 DIAGNOSIS — C50412 Malignant neoplasm of upper-outer quadrant of left female breast: Secondary | ICD-10-CM

## 2022-06-17 DIAGNOSIS — R7401 Elevation of levels of liver transaminase levels: Secondary | ICD-10-CM | POA: Diagnosis not present

## 2022-06-17 DIAGNOSIS — Z79810 Long term (current) use of selective estrogen receptor modulators (SERMs): Secondary | ICD-10-CM | POA: Diagnosis not present

## 2022-06-17 DIAGNOSIS — Z170 Estrogen receptor positive status [ER+]: Secondary | ICD-10-CM | POA: Diagnosis not present

## 2022-06-17 LAB — CMP (CANCER CENTER ONLY)
ALT: 131 U/L — ABNORMAL HIGH (ref 0–44)
AST: 122 U/L — ABNORMAL HIGH (ref 15–41)
Albumin: 4.1 g/dL (ref 3.5–5.0)
Alkaline Phosphatase: 96 U/L (ref 38–126)
Anion gap: 6 (ref 5–15)
BUN: 13 mg/dL (ref 6–20)
CO2: 28 mmol/L (ref 22–32)
Calcium: 9.2 mg/dL (ref 8.9–10.3)
Chloride: 106 mmol/L (ref 98–111)
Creatinine: 0.71 mg/dL (ref 0.44–1.00)
GFR, Estimated: >60 mL/min (ref >60)
Glucose, Bld: 107 mg/dL — ABNORMAL HIGH (ref 70–99)
Potassium: 4.2 mmol/L (ref 3.5–5.1)
Sodium: 140 mmol/L (ref 135–145)
Total Bilirubin: 0.4 mg/dL (ref 0.3–1.2)
Total Protein: 7.9 g/dL (ref 6.5–8.1)

## 2022-06-17 LAB — CBC WITH DIFFERENTIAL (CANCER CENTER ONLY)
Abs Immature Granulocytes: 0 10*3/uL (ref 0.00–0.07)
Basophils Absolute: 0 10*3/uL (ref 0.0–0.1)
Basophils Relative: 1 %
Eosinophils Absolute: 0.1 10*3/uL (ref 0.0–0.5)
Eosinophils Relative: 3 %
HCT: 38.7 % (ref 36.0–46.0)
Hemoglobin: 12.3 g/dL (ref 12.0–15.0)
Immature Granulocytes: 0 %
Lymphocytes Relative: 35 %
Lymphs Abs: 1.6 10*3/uL (ref 0.7–4.0)
MCH: 27.3 pg (ref 26.0–34.0)
MCHC: 31.8 g/dL (ref 30.0–36.0)
MCV: 85.8 fL (ref 80.0–100.0)
Monocytes Absolute: 0.4 10*3/uL (ref 0.1–1.0)
Monocytes Relative: 8 %
Neutro Abs: 2.4 10*3/uL (ref 1.7–7.7)
Neutrophils Relative %: 53 %
Platelet Count: 247 10*3/uL (ref 150–400)
RBC: 4.51 MIL/uL (ref 3.87–5.11)
RDW: 12.8 % (ref 11.5–15.5)
WBC Count: 4.5 10*3/uL (ref 4.0–10.5)
nRBC: 0 % (ref 0.0–0.2)

## 2022-06-19 ENCOUNTER — Telehealth: Payer: Self-pay

## 2022-06-19 NOTE — Telephone Encounter (Signed)
Fax'ed pt's lab results from 06/18/22 to Dr [name]  [phone] per NP. Fax confirmation received.

## 2022-06-19 NOTE — Telephone Encounter (Signed)
-----   Message from Gardenia Phlegm, NP sent at 06/18/2022  8:46 AM EST ----- Please fax patient labs from yesterday to Dr. [name].

## 2022-07-09 ENCOUNTER — Other Ambulatory Visit: Payer: Self-pay

## 2022-07-09 DIAGNOSIS — K760 Fatty (change of) liver, not elsewhere classified: Secondary | ICD-10-CM

## 2022-07-09 DIAGNOSIS — Z170 Estrogen receptor positive status [ER+]: Secondary | ICD-10-CM

## 2022-07-10 ENCOUNTER — Other Ambulatory Visit (HOSPITAL_COMMUNITY): Payer: Self-pay

## 2022-07-10 DIAGNOSIS — R059 Cough, unspecified: Secondary | ICD-10-CM | POA: Diagnosis not present

## 2022-07-10 DIAGNOSIS — J069 Acute upper respiratory infection, unspecified: Secondary | ICD-10-CM | POA: Diagnosis not present

## 2022-07-10 MED ORDER — HYDROCODONE BIT-HOMATROP MBR 5-1.5 MG/5ML PO SOLN
5.0000 mL | Freq: Four times a day (QID) | ORAL | 0 refills | Status: DC | PRN
  Filled 2022-07-10: qty 100, 5d supply, fill #0

## 2022-07-10 MED ORDER — SULFAMETHOXAZOLE-TRIMETHOPRIM 800-160 MG PO TABS
1.0000 | ORAL_TABLET | Freq: Two times a day (BID) | ORAL | 0 refills | Status: AC
  Filled 2022-07-10: qty 20, 10d supply, fill #0

## 2022-07-11 ENCOUNTER — Other Ambulatory Visit (HOSPITAL_COMMUNITY): Payer: Self-pay

## 2022-07-17 ENCOUNTER — Inpatient Hospital Stay: Payer: 59 | Attending: Adult Health

## 2022-07-17 ENCOUNTER — Other Ambulatory Visit: Payer: Self-pay

## 2022-07-17 ENCOUNTER — Telehealth: Payer: Self-pay

## 2022-07-17 DIAGNOSIS — Z170 Estrogen receptor positive status [ER+]: Secondary | ICD-10-CM | POA: Diagnosis not present

## 2022-07-17 DIAGNOSIS — C50412 Malignant neoplasm of upper-outer quadrant of left female breast: Secondary | ICD-10-CM | POA: Insufficient documentation

## 2022-07-17 DIAGNOSIS — K760 Fatty (change of) liver, not elsewhere classified: Secondary | ICD-10-CM

## 2022-07-17 LAB — CBC WITH DIFFERENTIAL (CANCER CENTER ONLY)
Abs Immature Granulocytes: 0.01 10*3/uL (ref 0.00–0.07)
Basophils Absolute: 0 10*3/uL (ref 0.0–0.1)
Basophils Relative: 1 %
Eosinophils Absolute: 0.1 10*3/uL (ref 0.0–0.5)
Eosinophils Relative: 3 %
HCT: 39.2 % (ref 36.0–46.0)
Hemoglobin: 12.6 g/dL (ref 12.0–15.0)
Immature Granulocytes: 0 %
Lymphocytes Relative: 33 %
Lymphs Abs: 1.3 10*3/uL (ref 0.7–4.0)
MCH: 27.6 pg (ref 26.0–34.0)
MCHC: 32.1 g/dL (ref 30.0–36.0)
MCV: 86 fL (ref 80.0–100.0)
Monocytes Absolute: 0.2 10*3/uL (ref 0.1–1.0)
Monocytes Relative: 5 %
Neutro Abs: 2.4 10*3/uL (ref 1.7–7.7)
Neutrophils Relative %: 58 %
Platelet Count: 260 10*3/uL (ref 150–400)
RBC: 4.56 MIL/uL (ref 3.87–5.11)
RDW: 13.1 % (ref 11.5–15.5)
WBC Count: 4.1 10*3/uL (ref 4.0–10.5)
nRBC: 0 % (ref 0.0–0.2)

## 2022-07-17 LAB — HEPATIC FUNCTION PANEL
ALT: 139 U/L — ABNORMAL HIGH (ref 0–44)
AST: 121 U/L — ABNORMAL HIGH (ref 15–41)
Albumin: 3.4 g/dL — ABNORMAL LOW (ref 3.5–5.0)
Alkaline Phosphatase: 86 U/L (ref 38–126)
Bilirubin, Direct: <0.1 mg/dL (ref 0.0–0.2)
Total Bilirubin: 0.5 mg/dL (ref 0.3–1.2)
Total Protein: 7.7 g/dL (ref 6.5–8.1)

## 2022-07-17 LAB — BASIC METABOLIC PANEL - CANCER CENTER ONLY
Anion gap: 9 (ref 5–15)
BUN: 14 mg/dL (ref 6–20)
CO2: 25 mmol/L (ref 22–32)
Calcium: 10 mg/dL (ref 8.9–10.3)
Chloride: 105 mmol/L (ref 98–111)
Creatinine: 0.81 mg/dL (ref 0.44–1.00)
GFR, Estimated: >60 mL/min (ref >60)
Glucose, Bld: 112 mg/dL — ABNORMAL HIGH (ref 70–99)
Potassium: 4.1 mmol/L (ref 3.5–5.1)
Sodium: 139 mmol/L (ref 135–145)

## 2022-07-17 NOTE — Telephone Encounter (Signed)
Per NP's request, faxed pt's lab results from 07/17/22 to pt's PCP, Dr [name]. Fax # [phone]. Fax confirmation received.

## 2022-07-17 NOTE — Telephone Encounter (Signed)
-----   Message from Gardenia Phlegm, NP sent at 07/17/2022 10:53 AM EST ----- Please fax to PCP--I am finding out who she sees in GI to see when her next appt with them is.   ----- Message ----- From: Buel Ream, Lab In [place] Sent: 07/17/2022   9:18 AM EST To: Gardenia Phlegm, NP

## 2022-07-22 ENCOUNTER — Other Ambulatory Visit: Payer: Self-pay | Admitting: Adult Health

## 2022-07-22 ENCOUNTER — Other Ambulatory Visit (HOSPITAL_COMMUNITY): Payer: Self-pay

## 2022-07-22 DIAGNOSIS — Z170 Estrogen receptor positive status [ER+]: Secondary | ICD-10-CM

## 2022-07-22 MED ORDER — ANASTROZOLE 1 MG PO TABS
1.0000 mg | ORAL_TABLET | Freq: Every day | ORAL | 0 refills | Status: DC
  Filled 2022-07-22: qty 90, 90d supply, fill #0

## 2022-07-22 NOTE — Progress Notes (Signed)
Site and I discussed changing from tamoxifen to anastrozole due to her previous elevated liver enzymes.  She will also continue to follow-up with gastroenterology around her liver enzymes that she has been.  Wilber Bihari, NP 07/22/22 2:09 PM Medical Oncology and Hematology [facility] [place], [place] [address] Tel. [phone]    Fax. [phone]

## 2022-09-03 ENCOUNTER — Other Ambulatory Visit: Payer: Self-pay | Admitting: Gastroenterology

## 2022-09-03 DIAGNOSIS — R7989 Other specified abnormal findings of blood chemistry: Secondary | ICD-10-CM

## 2022-09-03 DIAGNOSIS — K760 Fatty (change of) liver, not elsewhere classified: Secondary | ICD-10-CM | POA: Diagnosis not present

## 2022-09-18 DIAGNOSIS — H6122 Impacted cerumen, left ear: Secondary | ICD-10-CM | POA: Diagnosis not present

## 2022-09-24 ENCOUNTER — Other Ambulatory Visit: Payer: Commercial Managed Care - PPO

## 2022-09-30 ENCOUNTER — Other Ambulatory Visit: Payer: Self-pay | Admitting: Gastroenterology

## 2022-09-30 ENCOUNTER — Ambulatory Visit
Admission: RE | Admit: 2022-09-30 | Discharge: 2022-09-30 | Disposition: A | Discharge status: Home / Self Care | Payer: Commercial Managed Care - PPO | Source: Ambulatory Visit | Attending: Gastroenterology | Admitting: Gastroenterology

## 2022-09-30 DIAGNOSIS — K760 Fatty (change of) liver, not elsewhere classified: Secondary | ICD-10-CM

## 2022-09-30 DIAGNOSIS — R7989 Other specified abnormal findings of blood chemistry: Secondary | ICD-10-CM

## 2022-09-30 DIAGNOSIS — R945 Abnormal results of liver function studies: Secondary | ICD-10-CM | POA: Diagnosis not present

## 2022-10-07 DIAGNOSIS — Z170 Estrogen receptor positive status [ER+]: Secondary | ICD-10-CM | POA: Diagnosis not present

## 2022-10-07 DIAGNOSIS — C50412 Malignant neoplasm of upper-outer quadrant of left female breast: Secondary | ICD-10-CM | POA: Diagnosis not present

## 2022-10-07 DIAGNOSIS — H5202 Hypermetropia, left eye: Secondary | ICD-10-CM | POA: Diagnosis not present

## 2022-10-24 DIAGNOSIS — K760 Fatty (change of) liver, not elsewhere classified: Secondary | ICD-10-CM | POA: Diagnosis not present

## 2022-10-24 DIAGNOSIS — R7989 Other specified abnormal findings of blood chemistry: Secondary | ICD-10-CM | POA: Diagnosis not present

## 2022-10-25 ENCOUNTER — Other Ambulatory Visit (HOSPITAL_COMMUNITY): Payer: Self-pay

## 2022-10-25 ENCOUNTER — Other Ambulatory Visit: Payer: Self-pay | Admitting: Adult Health

## 2022-10-25 DIAGNOSIS — Z170 Estrogen receptor positive status [ER+]: Secondary | ICD-10-CM

## 2022-10-25 MED ORDER — ANASTROZOLE 1 MG PO TABS
1.0000 mg | ORAL_TABLET | Freq: Every day | ORAL | 0 refills | Status: DC
  Filled 2022-10-25: qty 90, 90d supply, fill #0

## 2022-11-06 ENCOUNTER — Other Ambulatory Visit (HOSPITAL_COMMUNITY): Payer: Self-pay

## 2022-11-06 DIAGNOSIS — Z23 Encounter for immunization: Secondary | ICD-10-CM | POA: Diagnosis not present

## 2022-11-06 DIAGNOSIS — C50919 Malignant neoplasm of unspecified site of unspecified female breast: Secondary | ICD-10-CM | POA: Diagnosis not present

## 2022-11-06 DIAGNOSIS — R7303 Prediabetes: Secondary | ICD-10-CM | POA: Diagnosis not present

## 2022-11-06 DIAGNOSIS — F411 Generalized anxiety disorder: Secondary | ICD-10-CM | POA: Diagnosis not present

## 2022-11-06 DIAGNOSIS — E785 Hyperlipidemia, unspecified: Secondary | ICD-10-CM | POA: Diagnosis not present

## 2022-11-06 DIAGNOSIS — H6120 Impacted cerumen, unspecified ear: Secondary | ICD-10-CM | POA: Diagnosis not present

## 2022-11-06 DIAGNOSIS — Z0000 Encounter for general adult medical examination without abnormal findings: Secondary | ICD-10-CM | POA: Diagnosis not present

## 2022-11-06 MED ORDER — VENLAFAXINE HCL ER 75 MG PO CP24: 75.0000 mg | ORAL_CAPSULE | Freq: Every day | ORAL | 3 refills | Status: AC

## 2022-11-15 ENCOUNTER — Other Ambulatory Visit: Payer: Self-pay | Admitting: Obstetrics and Gynecology

## 2022-11-15 DIAGNOSIS — Z853 Personal history of malignant neoplasm of breast: Secondary | ICD-10-CM

## 2022-11-15 DIAGNOSIS — Z9889 Other specified postprocedural states: Secondary | ICD-10-CM

## 2022-12-31 ENCOUNTER — Ambulatory Visit
Admission: RE | Admit: 2022-12-31 | Discharge: 2022-12-31 | Disposition: A | Discharge status: Home / Self Care | Payer: Commercial Managed Care - PPO | Source: Ambulatory Visit | Attending: Family Medicine | Admitting: Family Medicine

## 2022-12-31 ENCOUNTER — Other Ambulatory Visit: Payer: Self-pay | Admitting: Family Medicine

## 2022-12-31 DIAGNOSIS — Z1231 Encounter for screening mammogram for malignant neoplasm of breast: Secondary | ICD-10-CM | POA: Diagnosis not present

## 2023-01-01 DIAGNOSIS — K5730 Diverticulosis of large intestine without perforation or abscess without bleeding: Secondary | ICD-10-CM | POA: Diagnosis not present

## 2023-01-01 DIAGNOSIS — Z86010 Personal history of colonic polyps: Secondary | ICD-10-CM | POA: Diagnosis not present

## 2023-01-01 DIAGNOSIS — R7401 Elevation of levels of liver transaminase levels: Secondary | ICD-10-CM | POA: Diagnosis not present

## 2023-01-04 DIAGNOSIS — N390 Urinary tract infection, site not specified: Secondary | ICD-10-CM | POA: Diagnosis not present

## 2023-01-04 DIAGNOSIS — R350 Frequency of micturition: Secondary | ICD-10-CM | POA: Diagnosis not present

## 2023-01-04 DIAGNOSIS — R300 Dysuria: Secondary | ICD-10-CM | POA: Diagnosis not present

## 2023-01-22 ENCOUNTER — Other Ambulatory Visit: Payer: Self-pay | Admitting: Hematology and Oncology

## 2023-01-22 ENCOUNTER — Other Ambulatory Visit (HOSPITAL_COMMUNITY): Payer: Self-pay

## 2023-01-22 DIAGNOSIS — Z170 Estrogen receptor positive status [ER+]: Secondary | ICD-10-CM

## 2023-01-22 MED ORDER — ANASTROZOLE 1 MG PO TABS
1.0000 mg | ORAL_TABLET | Freq: Every day | ORAL | 0 refills | Status: DC
  Filled 2023-01-22: qty 90, 90d supply, fill #0

## 2023-01-23 ENCOUNTER — Other Ambulatory Visit (HOSPITAL_COMMUNITY): Payer: Self-pay

## 2023-03-11 ENCOUNTER — Other Ambulatory Visit (HOSPITAL_COMMUNITY): Payer: Self-pay

## 2023-03-11 MED ORDER — DIAZEPAM 5 MG PO TABS
5.0000 mg | ORAL_TABLET | ORAL | 0 refills | Status: AC
  Filled 2023-03-11: qty 30, 15d supply, fill #0

## 2023-05-14 ENCOUNTER — Other Ambulatory Visit: Payer: Self-pay | Admitting: Hematology and Oncology

## 2023-05-14 DIAGNOSIS — C50412 Malignant neoplasm of upper-outer quadrant of left female breast: Secondary | ICD-10-CM

## 2023-05-15 ENCOUNTER — Inpatient Hospital Stay: Payer: Managed Care, Other (non HMO) | Attending: Hematology and Oncology | Admitting: Hematology and Oncology

## 2023-05-15 VITALS — BP 131/65 | HR 85 | Temp 98.2°F | Resp 17 | Wt 202.8 lb

## 2023-05-15 DIAGNOSIS — Z9221 Personal history of antineoplastic chemotherapy: Secondary | ICD-10-CM | POA: Insufficient documentation

## 2023-05-15 DIAGNOSIS — Z923 Personal history of irradiation: Secondary | ICD-10-CM | POA: Insufficient documentation

## 2023-05-15 DIAGNOSIS — Z170 Estrogen receptor positive status [ER+]: Secondary | ICD-10-CM | POA: Diagnosis not present

## 2023-05-15 DIAGNOSIS — C50412 Malignant neoplasm of upper-outer quadrant of left female breast: Secondary | ICD-10-CM | POA: Diagnosis not present

## 2023-05-15 DIAGNOSIS — Z79811 Long term (current) use of aromatase inhibitors: Secondary | ICD-10-CM | POA: Insufficient documentation

## 2023-05-15 DIAGNOSIS — Z800 Family history of malignant neoplasm of digestive organs: Secondary | ICD-10-CM | POA: Insufficient documentation

## 2023-05-15 DIAGNOSIS — R232 Flushing: Secondary | ICD-10-CM | POA: Diagnosis not present

## 2023-05-15 DIAGNOSIS — Z1721 Progesterone receptor positive status: Secondary | ICD-10-CM | POA: Insufficient documentation

## 2023-05-15 NOTE — Assessment & Plan Note (Signed)
Natasha Huffman is a 62 year old woman with history of stage Ia ER/PR positive, HER2 positive invasive ductal carcinoma.  She is status post neoadjuvant chemotherapy, left lumpectomy, adjuvant Kadcyla x7 cycles, and adjuvant tamoxifen which began in January 2021.  She was switched to anastrozole in May 2023 because of concern that tamoxifen may be contributing to the transaminitis.  Breast Cancer Post lumpectomy, chemotherapy, radiation, and Kadcyla treatment. Currently on Anastrozole for over three years. No changes in breast noted on examination. -Continue Anastrozole daily.  She will complete 5 years of antiestrogen therapy in January 2026. -Annual follow-up visit.  Hot Flashes Occasional hot flashes, possibly related to Anastrozole. Currently managed with Venlafaxine. -Continue Venlafaxine.  Liver Enzymes Elevated liver enzymes noted in the past. Extensive workup including ultrasound, labs, and MRI were normal. Continue to follow-up with gastroenterology as needed. -No specific plan discussed.  General Health Maintenance Encouraged physical activity and stress management. -Physical with primary care provider due in February or March 2025.

## 2023-05-15 NOTE — Progress Notes (Signed)
[facility] Cancer Follow up:    Natasha Heys, MD [address] [place] [address]   DIAGNOSIS:  Cancer Staging  Malignant neoplasm of upper-outer quadrant of left breast in female, estrogen receptor positive (HCC) Staging form: Breast, AJCC 8th Edition - Clinical: Stage IA (cT1c, cN0, cM0, G3, ER+, PR+, HER2+) - Signed by Lowella Dell, MD on 01/04/2021 Method of lymph node assessment: Clinical Histologic grading system: 3 grade system Laterality: Left Tumor size (mm): 18   SUMMARY OF ONCOLOGIC HISTORY: Per Dr. Darrall Dears office visit from May 2022.  [name], [place] woman status post left breast upper outer quadrant biopsy 09/23/2018 for a clinical T1c N0, stage IA invasive ductal carcinoma, grade 3, triple positive, with an MIB-1 of 40%   (1) neoadjuvant chemotherapy consisting of carboplatin, docetaxel, and trastuzumab started 10/22/2018, to be repeated every 21 days x 6             (a) echocardiogram on 10/07/2018 shows a LVEF of 60-65%.             (b) docetaxel discontinued and gemcitabine substituted with cycle 5 because of neuropathy              (c) trastuzumab changed to T-DM1 post-op   (3) status post left lumpectomy and sentinel lymph node sampling 04/08/2019 showing a residual ypT1c ypN0 residual invasive ductal carcinoma, grade 3, margins negative, again HER-2 positive             (a) 4 lymph nodes were negative   (4) adjuvant TDM-1 started 05/18/2019 due to residual disease after neoadjuvant chemotherapy, completed 7 cycles 10/12/2019             (a) final echo 11/24/2019 showed an ejection fraction in the 55-60% range (4) adjuvant radiation 05/12/2019 - 06/25/2019             (a) left breast / 50.4 Gy in 28 fractions             (b) seroma boost / 10 Gy in 5 fractions   (5) started tamoxifen August 13, 2019, switched to anastrozole in Feb 2024.  CURRENT THERAPY: Anastrozole  INTERVAL HISTORY:  45 Z Stuck 62 y.o.  female returns for follow-up of her history of left breast triple positive invasive ductal carcinoma.  She continues on tamoxifen daily with good tolerance.  She has occasional hot flashes but these are manageable.  The patient, with a history of breast cancer, presents for a routine follow-up. She underwent chemotherapy, lumpectomy, radiation, and Kadcyla treatment before starting on anastrozole. She has been on anastrozole for over three years. She reports occasional hot flashes, which are not severe. She denies any other side effects from the anastrozole.  She also reports a recent workup for elevated liver enzymes, including ultrasound and MRI, which were all normal. She denies alcohol use or any other potential causes for the liver enzyme elevation. She has not seen any other specialists recently, but her primary care provider recently retired, and she is due for a physical in February or March.  The patient admits to a sedentary lifestyle due to working from home and acknowledges the need for more physical activity. She has a membership at [employer] but has not been utilizing it. She also reports occasional bone aches, which she attributes to the anastrozole. She denies any changes in her breast or any bowel or urinary issues.  Patient Active Problem List   Diagnosis Date Noted  Hepatic steatosis 01/04/2021   Elevated liver enzymes 11/01/2020   History of adenomatous polyp of colon 05/13/2019   Malignant neoplasm of upper-outer quadrant of left breast in female, estrogen receptor positive (HCC) 09/28/2018   Acute calculous cholecystitis 02/22/2015   Knee pain 05/04/2012    has No Known Allergies.  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   Peripheral neuropathy due to chemotherapy [facility])    Personal history of chemotherapy 2020   Left Breast Cancer   Personal history of radiation therapy 2020   Left Breast Cancer   Port-A-Cath in place 01/11/2019    SURGICAL HISTORY: Past Surgical History:  Procedure Laterality Date   BREAST EXCISIONAL BIOPSY Left 04/08/2019   BREAST LUMPECTOMY Left 04/08/2019   BREAST LUMPECTOMY WITH RADIOACTIVE SEED AND SENTINEL LYMPH NODE BIOPSY Left 04/08/2019   Procedure: LEFT BREAST RADIOACTIVE SEED LUMPECTOMY X2 AND LEFT SENTINEL LYMPH NODE MAPPING;  Surgeon: Harriette Bouillon, MD;  Location: [facility];  Service: General;  Laterality: Left;   CESAREAN SECTION  1990; 1995   CHOLECYSTECTOMY N/A 02/22/2015   Procedure: LAPAROSCOPIC CHOLECYSTECTOMY WITH INTRAOPERATIVE CHOLANGIOGRAM;  Surgeon: Abigail Miyamoto, MD;  Location: [facility] OR;  Service: General;  Laterality: N/A;   LAPAROSCOPIC CHOLECYSTECTOMY  02/22/2015   PORT-A-CATH REMOVAL N/A 02/03/2020   Procedure: REMOVAL PORT-A-CATH;  Surgeon: Harriette Bouillon, MD;  Location: [facility];  Service: General;  Laterality: N/A;   PORTACATH PLACEMENT Right 10/08/2018   Procedure: INSERTION PORT-A-CATH WITH ULTRASOUND;  Surgeon: Harriette Bouillon, MD;  Location: [facility] OR;  Service: General;  Laterality: Right;   TUBAL LIGATION  1995    SOCIAL HISTORY: Social History   Socioeconomic History   Marital status: Married    Spouse name: Not on file   Number of children: Not on file   Years of education: Not on file   Highest education level: Not on file  Occupational History   Not on file  Tobacco Use   Smoking status: Never   Smokeless tobacco: Never  Substance and Sexual Activity   Alcohol use: No   Drug use: No   Sexual activity: Yes    Birth control/protection: Post-menopausal  Other Topics Concern   Not on file  Social History Narrative   Not on file   Social Determinants of Health   Financial Resource Strain: Not  on file  Food Insecurity: Not on file  Transportation Needs: No Transportation Needs (04/27/2019)   PRAPARE - Transportation    Lack of Transportation (Medical): No    Lack of Transportation (Non-Medical): No  Physical Activity: Not on file  Stress: Not on file  Social Connections: Not on file  Intimate Partner Violence: Not on file    FAMILY HISTORY: Family History  Problem Relation Age of Onset   Hypertension Sister    Colon cancer Maternal Aunt    Breast cancer Neg Hx     Review of Systems  Constitutional:  Positive for fatigue. Negative for appetite change, chills, fever and unexpected weight change.  HENT:   Negative for hearing loss, lump/mass and trouble swallowing.   Eyes:  Negative for eye problems and icterus.  Respiratory:  Negative for chest tightness, cough and shortness  of breath.   Cardiovascular:  Negative for chest pain, leg swelling and palpitations.  Gastrointestinal:  Negative for abdominal distention, abdominal pain, constipation, diarrhea, nausea and vomiting.  Endocrine: Negative for hot flashes.  Genitourinary:  Negative for difficulty urinating.   Musculoskeletal:  Negative for arthralgias.  Skin:  Negative for itching and rash.  Neurological:  Negative for dizziness, extremity weakness, headaches and numbness.  Hematological:  Negative for adenopathy. Does not bruise/bleed easily.  Psychiatric/Behavioral:  Negative for depression. The patient is not nervous/anxious.       PHYSICAL EXAMINATION  ECOG PERFORMANCE STATUS: 1 - Symptomatic but completely ambulatory  Vitals:   05/15/23 0911  BP: 131/65  Pulse: 85  Resp: 17  Temp: 98.2 F (36.8 C)  SpO2: 100%    Physical Exam Constitutional:      General: She is not in acute distress.    Appearance: Normal appearance. She is not toxic-appearing.  HENT:     Head: Normocephalic and atraumatic.  Eyes:     General: No scleral icterus. Cardiovascular:     Rate and Rhythm: Normal rate and regular  rhythm.     Pulses: Normal pulses.     Heart sounds: Normal heart sounds.  Pulmonary:     Effort: Pulmonary effort is normal.     Breath sounds: Normal breath sounds.  Chest:     Comments: Bilateral breast inspected.  No palpable masses or regional adenopathy.  Left breast status postsurgical changes.  Excellent cosmetic outcome overall. Abdominal:     General: Abdomen is flat. Bowel sounds are normal. There is no distension.     Palpations: Abdomen is soft.     Tenderness: There is no abdominal tenderness.  Musculoskeletal:        General: No swelling.     Cervical back: Neck supple.  Lymphadenopathy:     Cervical: No cervical adenopathy.  Skin:    General: Skin is warm and dry.     Findings: No rash.  Neurological:     General: No focal deficit present.     Mental Status: She is alert.  Psychiatric:        Mood and Affect: Mood normal.        Behavior: Behavior normal.     LABORATORY DATA:  CBC    Component Value Date/Time   WBC 4.1 07/17/2022 0856   WBC 3.9 (L) 01/04/2021 0857   RBC 4.56 07/17/2022 0856   HGB 12.6 07/17/2022 0856   HCT 39.2 07/17/2022 0856   PLT 260 07/17/2022 0856   MCV 86.0 07/17/2022 0856   MCH 27.6 07/17/2022 0856   MCHC 32.1 07/17/2022 0856   RDW 13.1 07/17/2022 0856   LYMPHSABS 1.3 07/17/2022 0856   MONOABS 0.2 07/17/2022 0856   EOSABS 0.1 07/17/2022 0856   BASOSABS 0.0 07/17/2022 0856    CMP     Component Value Date/Time   NA 139 07/17/2022 0856   K 4.1 07/17/2022 0856   CL 105 07/17/2022 0856   CO2 25 07/17/2022 0856   GLUCOSE 112 (H) 07/17/2022 0856   BUN 14 07/17/2022 0856   CREATININE 0.81 07/17/2022 0856   CALCIUM 10.0 07/17/2022 0856   PROT 7.7 07/17/2022 0856   ALBUMIN 3.4 (L) 07/17/2022 0856   AST 121 (H) 07/17/2022 0856   AST 122 (H) 06/17/2022 0900   ALT 139 (H) 07/17/2022 0856   ALT 131 (H) 06/17/2022 0900   ALKPHOS 86 07/17/2022 0856   BILITOT 0.5 07/17/2022 0856   BILITOT 0.4 06/17/2022  0900   GFRNONAA >60  07/17/2022 0856   GFRAA >60 01/05/2020 1216   GFRAA >60 01/11/2019 0815       ASSESSMENT and THERAPY PLAN:   Malignant neoplasm of upper-outer quadrant of left breast in female, estrogen receptor positive (HCC) Natasha Huffman is a 62 year old woman with history of stage Ia ER/PR positive, HER2 positive invasive ductal carcinoma.  She is status post neoadjuvant chemotherapy, left lumpectomy, adjuvant Kadcyla x7 cycles, and adjuvant tamoxifen which began in January 2021.  She was switched to anastrozole in May 2023 because of concern that tamoxifen may be contributing to the transaminitis.  Breast Cancer Post lumpectomy, chemotherapy, radiation, and Kadcyla treatment. Currently on Anastrozole for over three years. No changes in breast noted on examination. -Continue Anastrozole daily.  She will complete 5 years of antiestrogen therapy in January 2026. -Annual follow-up visit.  Hot Flashes Occasional hot flashes, possibly related to Anastrozole. Currently managed with Venlafaxine. -Continue Venlafaxine.  Liver Enzymes Elevated liver enzymes noted in the past. Extensive workup including ultrasound, labs, and MRI were normal. Continue to follow-up with gastroenterology as needed. -No specific plan discussed. -She said she will be meeting her PCP soon and will have some labs there as well.  General Health Maintenance Encouraged physical activity and stress management. -Physical with primary care provider due in February or March 2025.   All questions were answered. The patient knows to call the clinic with any problems, questions or concerns. We can certainly see the patient much sooner if necessary.  Total encounter time:30 minutes*in face-to-face visit time, chart review, lab review, care coordination, order entry, and documentation of the encounter time.    Lillard Anes, NP 05/15/23 9:45 AM Medical Oncology and Hematology [facility] [address] [place], [place]  [address] Tel. [phone]    Fax. [phone]  *Total Encounter Time as defined by the Centers for Medicare and Medicaid Services includes, in addition to the face-to-face time of a patient visit (documented in the note above) non-face-to-face time: obtaining and reviewing outside history, ordering and reviewing medications, tests or procedures, care coordination (communications with other health care professionals or caregivers) and documentation in the medical record.

## 2023-05-20 ENCOUNTER — Other Ambulatory Visit (HOSPITAL_COMMUNITY): Payer: Self-pay

## 2023-05-20 ENCOUNTER — Other Ambulatory Visit: Payer: Self-pay | Admitting: Hematology and Oncology

## 2023-05-20 DIAGNOSIS — C50412 Malignant neoplasm of upper-outer quadrant of left female breast: Secondary | ICD-10-CM

## 2023-05-20 MED ORDER — ANASTROZOLE 1 MG PO TABS
1.0000 mg | ORAL_TABLET | Freq: Every day | ORAL | 0 refills | Status: DC
  Filled 2023-05-20: qty 90, 90d supply, fill #0

## 2023-07-02 NOTE — Telephone Encounter (Signed)
Telephone call  

## 2023-07-24 ENCOUNTER — Other Ambulatory Visit (HOSPITAL_COMMUNITY): Payer: Self-pay

## 2023-07-24 MED ORDER — DIAZEPAM 5 MG PO TABS
5.0000 mg | ORAL_TABLET | ORAL | 0 refills | Status: AC | PRN
  Filled 2023-07-24: qty 30, 15d supply, fill #0

## 2023-08-01 ENCOUNTER — Encounter (HOSPITAL_COMMUNITY): Payer: Self-pay

## 2023-08-01 ENCOUNTER — Ambulatory Visit (HOSPITAL_COMMUNITY)
Admission: EM | Admit: 2023-08-01 | Discharge: 2023-08-01 | Disposition: A | Discharge status: Home / Self Care | Payer: Managed Care, Other (non HMO) | Attending: Nurse Practitioner | Admitting: Nurse Practitioner

## 2023-08-01 ENCOUNTER — Other Ambulatory Visit (HOSPITAL_COMMUNITY): Payer: Self-pay

## 2023-08-01 DIAGNOSIS — J069 Acute upper respiratory infection, unspecified: Secondary | ICD-10-CM | POA: Diagnosis not present

## 2023-08-01 DIAGNOSIS — J040 Acute laryngitis: Secondary | ICD-10-CM | POA: Diagnosis not present

## 2023-08-01 MED ORDER — BENZONATATE 100 MG PO CAPS
100.0000 mg | ORAL_CAPSULE | Freq: Three times a day (TID) | ORAL | 0 refills | Status: AC | PRN
  Filled 2023-08-01: qty 21, 7d supply, fill #0

## 2023-08-01 NOTE — Discharge Instructions (Signed)
You have a viral upper respiratory infection.  Symptoms should improve over the next week to 10 days.  If you develop chest pain or shortness of breath, go to the emergency room.  Some things that can make you feel better are: - Increased rest - Increasing fluid with water/sugar free electrolytes - Acetaminophen and ibuprofen as needed for fever/pain - Salt water gargling, chloraseptic spray and throat lozenges - OTC guaifenesin (Mucinex) 600 mg twice daily - Saline sinus flushes or a neti pot - Humidifying the air -Tessalon Perles every 8 hours as needed for dry cough

## 2023-08-01 NOTE — ED Triage Notes (Addendum)
Pt presents with nasal congestion and cough since Tuesday 12/17. Pt states "it began with a sore throat, that has now resolved however I am spitting up mucus and blowing out yellow snot now." Pt currently reports a 3/10 headache. Pt states she took Mucinex at 0500 this morning & Nyquil last night with little to no improvement in symptoms. Pt reports she feels worse.

## 2023-08-01 NOTE — ED Provider Notes (Signed)
[facility]    CSN: [id number] Arrival date & time: 08/01/23  4098      History   Chief Complaint Chief Complaint  Patient presents with   Nasal Congestion   Cough    HPI Natasha Huffman is a 62 y.o. female.   Patient presents today with 3-day history of bodyaches and chills, congested cough, runny and stuffy nose, sore throat that is now improved, and fatigue.  She denies fever, shortness of breath or chest pain, ear pain, abdominal pain, nausea/vomiting, and diarrhea.  Has been taking Mucinex and NyQuil for symptoms with minimal improvement.  No known sick contacts.    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   Peripheral neuropathy due to chemotherapy [facility])    Personal history of chemotherapy 2020   Left Breast Cancer   Personal history of radiation therapy 2020   Left Breast Cancer   Port-A-Cath in place 01/11/2019    Patient Active Problem List   Diagnosis Date Noted   Hepatic steatosis 01/04/2021   Elevated liver enzymes 11/01/2020   History of adenomatous polyp of colon 05/13/2019   Malignant neoplasm of upper-outer quadrant of left breast in female, estrogen receptor positive (HCC) 09/28/2018   Acute calculous cholecystitis 02/22/2015   Knee pain 05/04/2012    Past Surgical History:  Procedure Laterality Date   BREAST EXCISIONAL BIOPSY Left 04/08/2019   BREAST LUMPECTOMY Left 04/08/2019   BREAST LUMPECTOMY WITH RADIOACTIVE SEED AND SENTINEL LYMPH NODE BIOPSY Left 04/08/2019   Procedure: LEFT BREAST RADIOACTIVE SEED LUMPECTOMY X2 AND LEFT SENTINEL LYMPH NODE MAPPING;  Surgeon: Harriette Bouillon, MD;  Location: [facility];  Service: General;  Laterality: Left;   CESAREAN SECTION  1990; 1995    CHOLECYSTECTOMY N/A 02/22/2015   Procedure: LAPAROSCOPIC CHOLECYSTECTOMY WITH INTRAOPERATIVE CHOLANGIOGRAM;  Surgeon: Abigail Miyamoto, MD;  [facility];  Service: General;  Laterality: N/A;   LAPAROSCOPIC CHOLECYSTECTOMY  02/22/2015   PORT-A-CATH REMOVAL N/A 02/03/2020   Procedure: REMOVAL PORT-A-CATH;  Surgeon: Harriette Bouillon, MD;  Location: [facility];  Service: General;  Laterality: N/A;   PORTACATH PLACEMENT Right 10/08/2018   Procedure: INSERTION PORT-A-CATH WITH ULTRASOUND;  Surgeon: Harriette Bouillon, MD;  [facility];  Service: General;  Laterality: Right;   TUBAL LIGATION  1995    OB History   No obstetric history on file.      Home Medications    Prior to Admission medications   Medication Sig Start Date End Date Taking? Authorizing Provider  benzonatate (TESSALON) 100 MG capsule Take 1 capsule (100 mg total) by mouth 3 (three) times daily as needed for cough. Do not take with alcohol or while driving or operating heavy machinery.  May cause drowsiness. 08/01/23  Yes Cathlean Marseilles A, NP  anastrozole (ARIMIDEX) 1 MG tablet Take 1 tablet (1 mg total) by mouth daily. 05/20/23   Rachel Moulds, MD  diazepam (VALIUM) 5 MG tablet Take 1-2 tablets (5-10 mg total) by mouth 30-60 minutes before flying 03/08/22     diazepam (VALIUM) 5 MG tablet Take 1-2 tablets (5-10 mg total) by mouth 30-60 minutes before flying as needed 03/11/23     diazepam (VALIUM) 5 MG tablet Take 1-2  tablets (5-10 mg total) by mouth 30 -60 minutes before flying as needed. 07/24/23     sulfamethoxazole-trimethoprim (BACTRIM DS) 800-160 MG tablet Take 1 tablet by mouth 2 (two) times daily for 10 days 07/10/22   Redmon, Noelle, PA  venlafaxine XR ([name] XR) 75 MG 24 hr capsule Take 1 capsule (75 mg total) by mouth daily with food. 04/30/22     venlafaxine XR ([name] XR) 75 MG 24 hr capsule Take 1 capsule (75 mg total) by mouth daily with food 11/06/22       Family History Family History   Problem Relation Age of Onset   Hypertension Sister    Colon cancer Maternal Aunt    Breast cancer Neg Hx     Social History Social History   Tobacco Use   Smoking status: Never   Smokeless tobacco: Never  Vaping Use   Vaping status: Never Used  Substance Use Topics   Alcohol use: No   Drug use: No     Allergies   Patient has no known allergies.   Review of Systems Review of Systems Per HPI  Physical Exam Triage Vital Signs ED Triage Vitals  Encounter Vitals Group     BP 08/01/23 0910 131/82     Systolic BP Percentile --      Diastolic BP Percentile --      Pulse Rate 08/01/23 0910 95     Resp 08/01/23 0910 18     Temp 08/01/23 0910 98.7 F (37.1 C)     Temp Source 08/01/23 0910 Oral     SpO2 08/01/23 0910 95 %     Weight 08/01/23 0909 190 lb (86.2 kg)     Height 08/01/23 0909 5\' 2"  (1.575 m)     Head Circumference --      Peak Flow --      Pain Score 08/01/23 0908 3     Pain Loc --      Pain Education --      Exclude from Growth Chart --    No data found.  Updated Vital Signs BP 131/82 (BP Location: Right Arm)   Pulse 95   Temp 98.7 F (37.1 C) (Oral)   Resp 18   Ht 5\' 2"  (1.575 m)   Wt 190 lb (86.2 kg)   SpO2 95%   BMI 34.75 kg/m   Visual Acuity Right Eye Distance:   Left Eye Distance:   Bilateral Distance:    Right Eye Near:   Left Eye Near:    Bilateral Near:     Physical Exam Vitals and nursing note reviewed.  Constitutional:      General: She is not in acute distress.    Appearance: Normal appearance. She is not ill-appearing or toxic-appearing.  HENT:     Head: Normocephalic and atraumatic.     Right Ear: Tympanic membrane, ear canal and external ear normal.     Left Ear: Tympanic membrane, ear canal and external ear normal.     Nose: Congestion present. No rhinorrhea.     Mouth/Throat:     Mouth: Mucous membranes are moist.     Pharynx: Oropharynx is clear. No oropharyngeal exudate or posterior oropharyngeal erythema.   Eyes:     General: No scleral icterus.    Extraocular Movements: Extraocular movements intact.  Cardiovascular:     Rate and Rhythm: Normal rate and regular rhythm.  Pulmonary:     Effort: Pulmonary effort is normal. No respiratory distress.     Breath  sounds: Normal breath sounds. No wheezing, rhonchi or rales.  Musculoskeletal:     Cervical back: Normal range of motion and neck supple.  Lymphadenopathy:     Cervical: No cervical adenopathy.  Skin:    General: Skin is warm and dry.     Coloration: Skin is not jaundiced or pale.     Findings: No erythema or rash.  Neurological:     Mental Status: She is alert and oriented to person, place, and time.  Psychiatric:        Behavior: Behavior is cooperative.      UC Treatments / Results  Labs (all labs ordered are listed, but only abnormal results are displayed) Labs Reviewed - No data to display  EKG   Radiology No results found.  Procedures Procedures (including critical care time)  Medications Ordered in UC Medications - No data to display  Initial Impression / Assessment and Plan / UC Course  I have reviewed the triage vital signs and the nursing notes.  Pertinent labs & imaging results that were available during my care of the patient were reviewed by me and considered in my medical decision making (see chart for details).   Patient is well-appearing, normotensive, afebrile, not tachycardic, not tachypneic, oxygenating well on room air.    1. Viral URI with cough 2. Laryngitis Suspect viral etiology Viral testing declined by patient Supportive care discussed; start cough suppressant medication ER and return precautions discussed Work excuse provided  The patient was given the opportunity to ask questions.  All questions answered to their satisfaction.  The patient is in agreement to this plan.   Final Clinical Impressions(s) / UC Diagnoses   Final diagnoses:  Viral URI with cough  Laryngitis      Discharge Instructions      You have a viral upper respiratory infection.  Symptoms should improve over the next week to 10 days.  If you develop chest pain or shortness of breath, go to the emergency room.  Some things that can make you feel better are: - Increased rest - Increasing fluid with water/sugar free electrolytes - Acetaminophen and ibuprofen as needed for fever/pain - Salt water gargling, chloraseptic spray and throat lozenges - OTC guaifenesin (Mucinex) 600 mg twice daily - Saline sinus flushes or a neti pot - Humidifying the air -Tessalon Perles every 8 hours as needed for dry cough    ED Prescriptions     Medication Sig Dispense Auth. Provider   benzonatate (TESSALON) 100 MG capsule Take 1 capsule (100 mg total) by mouth 3 (three) times daily as needed for cough. Do not take with alcohol or while driving or operating heavy machinery.  May cause drowsiness. 21 capsule Valentino Nose, NP      PDMP not reviewed this encounter.   Valentino Nose, NP 08/01/23 1002

## 2023-08-21 ENCOUNTER — Other Ambulatory Visit (HOSPITAL_COMMUNITY): Payer: Self-pay

## 2023-08-21 ENCOUNTER — Other Ambulatory Visit: Payer: Self-pay | Admitting: Hematology and Oncology

## 2023-08-21 DIAGNOSIS — C50412 Malignant neoplasm of upper-outer quadrant of left female breast: Secondary | ICD-10-CM

## 2023-08-21 MED ORDER — ANASTROZOLE 1 MG PO TABS
1.0000 mg | ORAL_TABLET | Freq: Every day | ORAL | 0 refills | Status: DC
  Filled 2023-08-21: qty 90, 90d supply, fill #0

## 2023-09-09 ENCOUNTER — Other Ambulatory Visit (HOSPITAL_COMMUNITY): Payer: Self-pay

## 2023-09-09 MED ORDER — CLINDAMYCIN HCL 150 MG PO CAPS
150.0000 mg | ORAL_CAPSULE | Freq: Three times a day (TID) | ORAL | 0 refills | Status: AC
  Filled 2023-09-09: qty 21, 7d supply, fill #0

## 2023-09-09 MED ORDER — HYDROCODONE-ACETAMINOPHEN 5-325 MG PO TABS
1.0000 | ORAL_TABLET | ORAL | 0 refills | Status: AC | PRN
  Filled 2023-09-09: qty 12, 2d supply, fill #0

## 2023-09-15 ENCOUNTER — Other Ambulatory Visit (HOSPITAL_COMMUNITY): Payer: Self-pay

## 2023-09-15 MED ORDER — HYDROCODONE-ACETAMINOPHEN 10-325 MG PO TABS
1.0000 | ORAL_TABLET | ORAL | 0 refills | Status: AC
  Filled 2023-09-15: qty 10, 2d supply, fill #0

## 2023-09-23 ENCOUNTER — Other Ambulatory Visit (HOSPITAL_COMMUNITY): Payer: Self-pay

## 2023-09-23 MED ORDER — DICLOFENAC SODIUM 50 MG PO TBEC
50.0000 mg | DELAYED_RELEASE_TABLET | Freq: Two times a day (BID) | ORAL | 0 refills | Status: AC
  Filled 2023-09-23: qty 30, 15d supply, fill #0

## 2023-09-24 ENCOUNTER — Other Ambulatory Visit (HOSPITAL_COMMUNITY): Payer: Self-pay

## 2023-11-17 ENCOUNTER — Other Ambulatory Visit: Payer: Self-pay | Admitting: Obstetrics and Gynecology

## 2023-11-17 DIAGNOSIS — Z1231 Encounter for screening mammogram for malignant neoplasm of breast: Secondary | ICD-10-CM

## 2023-12-03 ENCOUNTER — Other Ambulatory Visit (HOSPITAL_COMMUNITY): Payer: Self-pay

## 2023-12-03 ENCOUNTER — Other Ambulatory Visit: Payer: Self-pay | Admitting: Hematology and Oncology

## 2023-12-03 DIAGNOSIS — Z170 Estrogen receptor positive status [ER+]: Secondary | ICD-10-CM

## 2023-12-03 MED ORDER — METRONIDAZOLE 1 % EX GEL
1.0000 | Freq: Every day | CUTANEOUS | 11 refills | Status: AC
  Filled 2023-12-03: qty 60, 30d supply, fill #0

## 2023-12-03 MED ORDER — ANASTROZOLE 1 MG PO TABS
1.0000 mg | ORAL_TABLET | Freq: Every day | ORAL | 0 refills | Status: DC
  Filled 2023-12-03: qty 90, 90d supply, fill #0

## 2024-01-01 ENCOUNTER — Ambulatory Visit
Admission: RE | Admit: 2024-01-01 | Discharge: 2024-01-01 | Disposition: A | Discharge status: Home / Self Care | Source: Ambulatory Visit | Attending: Obstetrics and Gynecology | Admitting: Obstetrics and Gynecology

## 2024-01-01 DIAGNOSIS — Z1231 Encounter for screening mammogram for malignant neoplasm of breast: Secondary | ICD-10-CM

## 2024-01-07 ENCOUNTER — Ambulatory Visit
Admission: RE | Admit: 2024-01-07 | Discharge: 2024-01-07 | Disposition: A | Discharge status: Home / Self Care | Source: Ambulatory Visit | Attending: Obstetrics and Gynecology | Admitting: Obstetrics and Gynecology

## 2024-01-07 ENCOUNTER — Other Ambulatory Visit: Payer: Self-pay | Admitting: Obstetrics and Gynecology

## 2024-01-07 DIAGNOSIS — R928 Other abnormal and inconclusive findings on diagnostic imaging of breast: Secondary | ICD-10-CM

## 2024-01-08 ENCOUNTER — Other Ambulatory Visit

## 2024-01-08 ENCOUNTER — Other Ambulatory Visit (HOSPITAL_COMMUNITY): Payer: Self-pay

## 2024-01-08 ENCOUNTER — Encounter

## 2024-01-08 MED ORDER — DIAZEPAM 5 MG PO TABS
5.0000 mg | ORAL_TABLET | Freq: Every day | ORAL | 1 refills | Status: AC | PRN
  Filled 2024-01-08: qty 30, 15d supply, fill #0
  Filled 2024-04-20: qty 30, 15d supply, fill #1

## 2024-01-09 ENCOUNTER — Other Ambulatory Visit (HOSPITAL_COMMUNITY): Payer: Self-pay

## 2024-01-13 ENCOUNTER — Other Ambulatory Visit (HOSPITAL_COMMUNITY): Payer: Self-pay

## 2024-02-03 ENCOUNTER — Other Ambulatory Visit (HOSPITAL_COMMUNITY): Payer: Self-pay

## 2024-02-03 MED ORDER — CLINDAMYCIN PHOSPHATE 1 % EX SOLN
1.0000 | Freq: Two times a day (BID) | CUTANEOUS | 3 refills | Status: AC
  Filled 2024-02-03: qty 60, 30d supply, fill #0
  Filled 2024-03-29: qty 60, 30d supply, fill #1
  Filled 2024-06-02: qty 60, 30d supply, fill #2
  Filled 2024-08-16: qty 60, 30d supply, fill #3

## 2024-03-03 ENCOUNTER — Other Ambulatory Visit (HOSPITAL_COMMUNITY): Payer: Self-pay

## 2024-03-24 ENCOUNTER — Other Ambulatory Visit: Payer: Self-pay | Admitting: Hematology and Oncology

## 2024-03-24 ENCOUNTER — Other Ambulatory Visit (HOSPITAL_COMMUNITY): Payer: Self-pay

## 2024-03-24 ENCOUNTER — Telehealth: Payer: Self-pay | Admitting: Hematology and Oncology

## 2024-03-24 DIAGNOSIS — C50412 Malignant neoplasm of upper-outer quadrant of left female breast: Secondary | ICD-10-CM

## 2024-03-24 DIAGNOSIS — Z170 Estrogen receptor positive status [ER+]: Secondary | ICD-10-CM

## 2024-03-24 MED ORDER — ANASTROZOLE 1 MG PO TABS
1.0000 mg | ORAL_TABLET | Freq: Every day | ORAL | 0 refills | Status: DC
  Filled 2024-03-24 (×2): qty 90, 90d supply, fill #0

## 2024-03-24 NOTE — Telephone Encounter (Signed)
 left vm for pt to call and schedule yearly f/u

## 2024-03-29 ENCOUNTER — Other Ambulatory Visit (HOSPITAL_COMMUNITY): Payer: Self-pay

## 2024-04-20 ENCOUNTER — Other Ambulatory Visit: Payer: Self-pay

## 2024-06-02 ENCOUNTER — Other Ambulatory Visit (HOSPITAL_COMMUNITY): Payer: Self-pay

## 2024-06-23 ENCOUNTER — Telehealth: Payer: Self-pay

## 2024-06-23 NOTE — Telephone Encounter (Signed)
-----   Message from Nurse Leita B sent at 06/23/2024  1:49 PM EST ----- Regarding: follow up Hi ladies,  This pt called to schedule MD f/u. She states she was never called for a f/u. Could you please call her to schedule an appt in Dr Marshal next available appt?  Thanks, LB

## 2024-06-28 ENCOUNTER — Telehealth: Payer: Self-pay

## 2024-06-28 NOTE — Telephone Encounter (Signed)
 Left message for patient about upcoming appointment on 11/18

## 2024-06-29 ENCOUNTER — Inpatient Hospital Stay: Attending: Hematology and Oncology | Admitting: Hematology and Oncology

## 2024-06-29 ENCOUNTER — Other Ambulatory Visit (HOSPITAL_COMMUNITY): Payer: Self-pay

## 2024-06-29 VITALS — BP 133/83 | HR 84 | Temp 97.9°F | Resp 16 | Wt 205.2 lb

## 2024-06-29 DIAGNOSIS — Z800 Family history of malignant neoplasm of digestive organs: Secondary | ICD-10-CM | POA: Insufficient documentation

## 2024-06-29 DIAGNOSIS — C50412 Malignant neoplasm of upper-outer quadrant of left female breast: Secondary | ICD-10-CM | POA: Diagnosis present

## 2024-06-29 DIAGNOSIS — Z79811 Long term (current) use of aromatase inhibitors: Secondary | ICD-10-CM | POA: Diagnosis not present

## 2024-06-29 DIAGNOSIS — R748 Abnormal levels of other serum enzymes: Secondary | ICD-10-CM | POA: Insufficient documentation

## 2024-06-29 DIAGNOSIS — Z9221 Personal history of antineoplastic chemotherapy: Secondary | ICD-10-CM | POA: Insufficient documentation

## 2024-06-29 DIAGNOSIS — Z170 Estrogen receptor positive status [ER+]: Secondary | ICD-10-CM | POA: Diagnosis not present

## 2024-06-29 DIAGNOSIS — Z1731 Human epidermal growth factor receptor 2 positive status: Secondary | ICD-10-CM | POA: Insufficient documentation

## 2024-06-29 DIAGNOSIS — Z923 Personal history of irradiation: Secondary | ICD-10-CM | POA: Insufficient documentation

## 2024-06-29 DIAGNOSIS — R232 Flushing: Secondary | ICD-10-CM | POA: Diagnosis not present

## 2024-06-29 DIAGNOSIS — Z1721 Progesterone receptor positive status: Secondary | ICD-10-CM | POA: Diagnosis not present

## 2024-06-29 MED ORDER — ANASTROZOLE 1 MG PO TABS
1.0000 mg | ORAL_TABLET | Freq: Every day | ORAL | 0 refills | Status: AC
  Filled 2024-06-29: qty 90, 90d supply, fill #0

## 2024-06-29 NOTE — Progress Notes (Signed)
 [facility] Cancer Follow up:    Natasha Charleston, MD (Inactive) [address] [place] [address]   DIAGNOSIS:  Cancer Staging  Malignant neoplasm of upper-outer quadrant of left breast in female, estrogen receptor positive (HCC) Staging form: Breast, AJCC 8th Edition - Clinical: Stage IA (cT1c, cN0, cM0, G3, ER+, PR+, HER2+) - Signed by Layla Sandria BROCKS, MD on 01/04/2021 Method of lymph node assessment: Clinical Histologic grading system: 3 grade system Laterality: Left Tumor size (mm): 18   SUMMARY OF ONCOLOGIC HISTORY: Per Dr. Tauna office visit from May 2022.  [name], [place] woman status post left breast upper outer quadrant biopsy 09/23/2018 for a clinical T1c N0, stage IA invasive ductal carcinoma, grade 3, triple positive, with an MIB-1 of 40%   (1) neoadjuvant chemotherapy consisting of carboplatin , docetaxel , and trastuzumab  started 10/22/2018, to be repeated every 21 days x 6             (a) echocardiogram on 10/07/2018 shows a LVEF of 60-65%.             (b) docetaxel  discontinued and gemcitabine  substituted with cycle 5 because of neuropathy              (c) trastuzumab  changed to T-DM1 post-op   (3) status post left lumpectomy and sentinel lymph node sampling 04/08/2019 showing a residual ypT1c ypN0 residual invasive ductal carcinoma, grade 3, margins negative, again HER-2 positive             (a) 4 lymph nodes were negative   (4) adjuvant TDM-1 started 05/18/2019 due to residual disease after neoadjuvant chemotherapy, completed 7 cycles 10/12/2019             (a) final echo 11/24/2019 showed an ejection fraction in the 55-60% range (4) adjuvant radiation 05/12/2019 - 06/25/2019             (a) left breast / 50.4 Gy in 28 fractions             (b) seroma boost / 10 Gy in 5 fractions   (5) started tamoxifen  August 13, 2019, switched to anastrozole  in Feb 2024.  CURRENT THERAPY: Anastrozole   INTERVAL HISTORY:  Natasha Huffman  63 y.o. female returns for follow-up of her history of left breast triple positive invasive ductal carcinoma.  She continues on tamoxifen  daily with good tolerance.    Discussed the use of AI scribe software for clinical note transcription with the patient, who gave verbal consent to proceed.  History of Present Illness Natasha Huffman is a 63 year old female with a history of left breast cancer who presents for a follow-up visit.  She is now on adj anastrozole . She is nearing the end of her five-year treatment course, expected to complete it by January or February 2026.  She experiences ongoing hot flashes and had previously been on [name] , which she has since stopped.  She underwent a colonoscopy on June 08, 2024, during which three benign polyps were removed.  She has not had a bone density scan recently.  No new headaches, double vision, or breathing difficulties. Her bowel movements are regular.  Rest of the pertinent 10 point ROS reviewed and neg.   Patient Active Problem List   Diagnosis Date Noted   Hepatic steatosis 01/04/2021   Elevated liver enzymes 11/01/2020   History of adenomatous polyp of colon 05/13/2019   Malignant neoplasm of upper-outer quadrant of left breast in female, estrogen receptor positive (HCC)  09/28/2018   Acute calculous cholecystitis 02/22/2015   Knee pain 05/04/2012    has no known allergies.  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   Peripheral neuropathy due to chemotherapy    Personal history of chemotherapy 2020   Left Breast Cancer   Personal history of radiation therapy 2020   Left Breast Cancer   Port-A-Cath in place 01/11/2019    SURGICAL HISTORY: Past Surgical History:  Procedure  Laterality Date   BREAST EXCISIONAL BIOPSY Left 04/08/2019   BREAST LUMPECTOMY Left 04/08/2019   BREAST LUMPECTOMY WITH RADIOACTIVE SEED AND SENTINEL LYMPH NODE BIOPSY Left 04/08/2019   Procedure: LEFT BREAST RADIOACTIVE SEED LUMPECTOMY X2 AND LEFT SENTINEL LYMPH NODE MAPPING;  Surgeon: Vanderbilt Ned, MD;  Location: [facility];  Service: General;  Laterality: Left;   CESAREAN SECTION  1990; 1995   CHOLECYSTECTOMY N/A 02/22/2015   Procedure: LAPAROSCOPIC CHOLECYSTECTOMY WITH INTRAOPERATIVE CHOLANGIOGRAM;  Surgeon: Vicenta Poli, MD;  [facility];  Service: General;  Laterality: N/A;   LAPAROSCOPIC CHOLECYSTECTOMY  02/22/2015   PORT-A-CATH REMOVAL N/A 02/03/2020   Procedure: REMOVAL PORT-A-CATH;  Surgeon: Vanderbilt Ned, MD;  Location: [facility];  Service: General;  Laterality: N/A;   PORTACATH PLACEMENT Right 10/08/2018   Procedure: INSERTION PORT-A-CATH WITH ULTRASOUND;  Surgeon: Vanderbilt Ned, MD;  [facility];  Service: General;  Laterality: Right;   TUBAL LIGATION  1995    SOCIAL HISTORY: Social History   Socioeconomic History   Marital status: Married    Spouse name: Not on file   Number of children: Not on file   Years of education: Not on file   Highest education level: Not on file  Occupational History   Not on file  Tobacco Use   Smoking status: Never   Smokeless tobacco: Never  Vaping Use   Vaping status: Never Used  Substance and Sexual Activity   Alcohol use: No   Drug use: No   Sexual activity: Yes    Birth control/protection: Post-menopausal  Other Topics Concern   Not on file  Social History Narrative   Not on file   Social Drivers of Health   Financial Resource Strain: Not on file  Food Insecurity: Not on file  Transportation Needs: No Transportation Needs (04/27/2019)   PRAPARE - Transportation    Lack of Transportation (Medical): No    Lack of Transportation (Non-Medical): No  Physical Activity: Not on file   Stress: Not on file  Social Connections: Not on file  Intimate Partner Violence: Not on file    FAMILY HISTORY: Family History  Problem Relation Age of Onset   Hypertension Sister    Colon cancer Maternal Aunt    Breast cancer Neg Hx     Review of Systems  Constitutional:  Positive for fatigue. Negative for appetite change, chills, fever and unexpected weight change.  HENT:   Negative for hearing loss, lump/mass and trouble swallowing.   Eyes:  Negative for eye problems and icterus.  Respiratory:  Negative for chest tightness, cough and shortness of breath.   Cardiovascular:  Negative for chest pain, leg swelling and palpitations.  Gastrointestinal:  Negative for abdominal distention, abdominal pain, constipation, diarrhea, nausea and  vomiting.  Endocrine: Negative for hot flashes.  Genitourinary:  Negative for difficulty urinating.   Musculoskeletal:  Negative for arthralgias.  Skin:  Negative for itching and rash.  Neurological:  Negative for dizziness, extremity weakness, headaches and numbness.  Hematological:  Negative for adenopathy. Does not bruise/bleed easily.  Psychiatric/Behavioral:  Negative for depression. The patient is not nervous/anxious.       PHYSICAL EXAMINATION  ECOG PERFORMANCE STATUS: 1 - Symptomatic but completely ambulatory  Vitals:   06/29/24 1127  BP: 133/83  Pulse: 84  Resp: 16  Temp: 97.9 F (36.6 C)  SpO2: 100%    Physical Exam Constitutional:      General: She is not in acute distress.    Appearance: Normal appearance. She is not toxic-appearing.  HENT:     Head: Normocephalic and atraumatic.  Eyes:     General: No scleral icterus. Cardiovascular:     Rate and Rhythm: Normal rate and regular rhythm.     Pulses: Normal pulses.     Heart sounds: Normal heart sounds.  Pulmonary:     Effort: Pulmonary effort is normal.     Breath sounds: Normal breath sounds.  Chest:     Comments: Bilateral breast inspected.  No palpable masses  or regional adenopathy.  Left breast status postsurgical changes.  Excellent cosmetic outcome overall. Abdominal:     General: Abdomen is flat. Bowel sounds are normal. There is no distension.     Palpations: Abdomen is soft.     Tenderness: There is no abdominal tenderness.  Musculoskeletal:        General: No swelling.     Cervical back: Neck supple.  Lymphadenopathy:     Cervical: No cervical adenopathy.  Skin:    General: Skin is warm and dry.     Findings: No rash.  Neurological:     General: No focal deficit present.     Mental Status: She is alert.  Psychiatric:        Mood and Affect: Mood normal.        Behavior: Behavior normal.     LABORATORY DATA:  CBC    Component Value Date/Time   WBC 4.1 07/17/2022 0856   WBC 3.9 (L) 01/04/2021 0857   RBC 4.56 07/17/2022 0856   HGB 12.6 07/17/2022 0856   HCT 39.2 07/17/2022 0856   PLT 260 07/17/2022 0856   MCV 86.0 07/17/2022 0856   MCH 27.6 07/17/2022 0856   MCHC 32.1 07/17/2022 0856   RDW 13.1 07/17/2022 0856   LYMPHSABS 1.3 07/17/2022 0856   MONOABS 0.2 07/17/2022 0856   EOSABS 0.1 07/17/2022 0856   BASOSABS 0.0 07/17/2022 0856    CMP     Component Value Date/Time   NA 139 07/17/2022 0856   K 4.1 07/17/2022 0856   CL 105 07/17/2022 0856   CO2 25 07/17/2022 0856   GLUCOSE 112 (H) 07/17/2022 0856   BUN 14 07/17/2022 0856   CREATININE 0.81 07/17/2022 0856   CALCIUM 10.0 07/17/2022 0856   PROT 7.7 07/17/2022 0856   ALBUMIN 3.4 (L) 07/17/2022 0856   AST 121 (H) 07/17/2022 0856   AST 122 (H) 06/17/2022 0900   ALT 139 (H) 07/17/2022 0856   ALT 131 (H) 06/17/2022 0900   ALKPHOS 86 07/17/2022 0856   BILITOT 0.5 07/17/2022 0856   BILITOT 0.4 06/17/2022 0900   GFRNONAA >60 07/17/2022 0856   GFRAA >60 01/05/2020 1216   GFRAA >60 01/11/2019 0815    ASSESSMENT and THERAPY PLAN:  Malignant neoplasm of upper-outer quadrant of left breast in female, estrogen receptor positive (HCC) Edgardo is a 63 year old woman  with history of stage Ia ER/PR positive, HER2 positive invasive ductal carcinoma.  She is status post neoadjuvant chemotherapy, left lumpectomy, adjuvant Kadcyla  x7 cycles, and adjuvant tamoxifen  which began in January 2021.  She was switched to anastrozole  in May 2023 because of concern that tamoxifen  may be contributing to the transaminitis.  Breast Cancer Post lumpectomy, chemotherapy, radiation, and Kadcyla  treatment. Currently on Anastrozole  for over three years. She was on tamoxifen  prior to that. No changes in breast noted on examination. -Continue Anastrozole  daily.  She will complete 5 years of antiestrogen therapy in January 2026. Last mammogram and US  in May 2025. Screening mammogram recommended in 1 yr.   Hot Flashes Occasional hot flashes, possibly related to Anastrozole .  Ok to monitor.  Liver Enzymes Elevated liver enzymes noted in the past. Extensive workup including ultrasound, labs, and MRI were normal. Continue to follow-up with gastroenterology as needed.   All questions were answered. The patient knows to call the clinic with any problems, questions or concerns. We can certainly see the patient much sooner if necessary.  Total encounter time:30 minutes*in face-to-face visit time, chart review, lab review, care coordination, order entry, and documentation of the encounter time.    *Total Encounter Time as defined by the Centers for Medicare and Medicaid Services includes, in addition to the face-to-face time of a patient visit (documented in the note above) non-face-to-face time: obtaining and reviewing outside history, ordering and reviewing medications, tests or procedures, care coordination (communications with other health care professionals or caregivers) and documentation in the medical record.

## 2024-07-02 ENCOUNTER — Other Ambulatory Visit: Payer: Self-pay

## 2024-08-16 ENCOUNTER — Other Ambulatory Visit (HOSPITAL_COMMUNITY): Payer: Self-pay

## 2024-08-17 ENCOUNTER — Other Ambulatory Visit (HOSPITAL_COMMUNITY): Payer: Self-pay

## 2024-09-02 ENCOUNTER — Encounter: Payer: Self-pay | Admitting: Hematology and Oncology

## 2025-06-28 ENCOUNTER — Inpatient Hospital Stay: Admitting: Hematology and Oncology

## 2025-06-28 ENCOUNTER — Inpatient Hospital Stay
